# Patient Record
Sex: Female | Born: 1935 | Race: White | Hispanic: No | Marital: Married | Smoking: Never smoker
Health system: Southern US, Community
[De-identification: ages and names within clinical notes are randomized; demographics above are authoritative.]

## PROBLEM LIST (undated history)

## (undated) DIAGNOSIS — K5792 Diverticulitis of intestine, part unspecified, without perforation or abscess without bleeding: Secondary | ICD-10-CM

## (undated) DIAGNOSIS — M940 Chondrocostal junction syndrome [Tietze]: Secondary | ICD-10-CM

## (undated) DIAGNOSIS — I6529 Occlusion and stenosis of unspecified carotid artery: Secondary | ICD-10-CM

## (undated) DIAGNOSIS — K589 Irritable bowel syndrome without diarrhea: Secondary | ICD-10-CM

## (undated) DIAGNOSIS — B351 Tinea unguium: Secondary | ICD-10-CM

## (undated) DIAGNOSIS — I739 Peripheral vascular disease, unspecified: Secondary | ICD-10-CM

## (undated) DIAGNOSIS — E559 Vitamin D deficiency, unspecified: Secondary | ICD-10-CM

## (undated) DIAGNOSIS — I10 Essential (primary) hypertension: Secondary | ICD-10-CM

## (undated) DIAGNOSIS — M1990 Unspecified osteoarthritis, unspecified site: Secondary | ICD-10-CM

## (undated) DIAGNOSIS — Z01810 Encounter for preprocedural cardiovascular examination: Secondary | ICD-10-CM

## (undated) DIAGNOSIS — I2510 Atherosclerotic heart disease of native coronary artery without angina pectoris: Secondary | ICD-10-CM

## (undated) DIAGNOSIS — R7989 Other specified abnormal findings of blood chemistry: Secondary | ICD-10-CM

## (undated) DIAGNOSIS — F419 Anxiety disorder, unspecified: Secondary | ICD-10-CM

## (undated) DIAGNOSIS — R072 Precordial pain: Secondary | ICD-10-CM

## (undated) DIAGNOSIS — I110 Hypertensive heart disease with heart failure: Secondary | ICD-10-CM

## (undated) DIAGNOSIS — K219 Gastro-esophageal reflux disease without esophagitis: Secondary | ICD-10-CM

## (undated) DIAGNOSIS — M069 Rheumatoid arthritis, unspecified: Secondary | ICD-10-CM

## (undated) DIAGNOSIS — I779 Disorder of arteries and arterioles, unspecified: Secondary | ICD-10-CM

## (undated) DIAGNOSIS — E7800 Pure hypercholesterolemia, unspecified: Secondary | ICD-10-CM

## (undated) DIAGNOSIS — E876 Hypokalemia: Secondary | ICD-10-CM

## (undated) DIAGNOSIS — Z95810 Presence of automatic (implantable) cardiac defibrillator: Secondary | ICD-10-CM

## (undated) DIAGNOSIS — I639 Cerebral infarction, unspecified: Secondary | ICD-10-CM

## (undated) HISTORY — PX: OTHER SURGICAL HISTORY: SHX169

## (undated) HISTORY — PX: FOOT SURGERY: SHX648

## (undated) HISTORY — DX: Diverticulitis of intestine, part unspecified, without perforation or abscess without bleeding: K57.92

## (undated) HISTORY — DX: Vitamin D deficiency, unspecified: E55.9

## (undated) HISTORY — DX: Irritable bowel syndrome, unspecified: K58.9

## (undated) HISTORY — DX: Hypertensive heart disease with heart failure: I11.0

## (undated) HISTORY — DX: Cerebral infarction, unspecified: I63.9

## (undated) HISTORY — DX: Other specified abnormal findings of blood chemistry: R79.89

## (undated) HISTORY — DX: Gastro-esophageal reflux disease without esophagitis: K21.9

## (undated) HISTORY — DX: Peripheral vascular disease, unspecified: I73.9

## (undated) HISTORY — DX: Unspecified osteoarthritis, unspecified site: M19.90

## (undated) HISTORY — DX: Precordial pain: R07.2

## (undated) HISTORY — DX: Occlusion and stenosis of unspecified carotid artery: I65.29

## (undated) HISTORY — DX: Atherosclerotic heart disease of native coronary artery without angina pectoris: I25.10

## (undated) HISTORY — DX: Encounter for preprocedural cardiovascular examination: Z01.810

## (undated) HISTORY — DX: Chondrocostal junction syndrome (tietze): M94.0

## (undated) HISTORY — DX: Pure hypercholesterolemia, unspecified: E78.00

## (undated) HISTORY — DX: Hypokalemia: E87.6

## (undated) HISTORY — DX: Tinea unguium: B35.1

## (undated) HISTORY — DX: Presence of automatic (implantable) cardiac defibrillator: Z95.810

## (undated) HISTORY — DX: Essential (primary) hypertension: I10

## (undated) HISTORY — DX: Anxiety disorder, unspecified: F41.9

## (undated) HISTORY — DX: Disorder of arteries and arterioles, unspecified: I77.9

## (undated) HISTORY — DX: Rheumatoid arthritis, unspecified: M06.9

---

## 1999-09-29 ENCOUNTER — Encounter: Admission: RE | Admit: 1999-09-29 | Discharge: 1999-09-29 | Payer: Self-pay | Admitting: Family Medicine

## 1999-09-29 ENCOUNTER — Encounter: Payer: Self-pay | Admitting: Family Medicine

## 2000-09-28 ENCOUNTER — Encounter: Payer: Self-pay | Admitting: Family Medicine

## 2000-09-28 ENCOUNTER — Encounter: Admission: RE | Admit: 2000-09-28 | Discharge: 2000-09-28 | Payer: Self-pay | Admitting: Family Medicine

## 2001-09-30 ENCOUNTER — Encounter: Payer: Self-pay | Admitting: Family Medicine

## 2001-09-30 ENCOUNTER — Encounter: Admission: RE | Admit: 2001-09-30 | Discharge: 2001-09-30 | Payer: Self-pay | Admitting: Family Medicine

## 2002-10-31 ENCOUNTER — Encounter: Payer: Self-pay | Admitting: Family Medicine

## 2002-10-31 ENCOUNTER — Encounter: Admission: RE | Admit: 2002-10-31 | Discharge: 2002-10-31 | Payer: Self-pay | Admitting: Family Medicine

## 2010-11-10 ENCOUNTER — Encounter: Payer: Self-pay | Admitting: Podiatry

## 2010-11-10 DIAGNOSIS — K589 Irritable bowel syndrome without diarrhea: Secondary | ICD-10-CM | POA: Insufficient documentation

## 2010-11-10 DIAGNOSIS — K219 Gastro-esophageal reflux disease without esophagitis: Secondary | ICD-10-CM | POA: Insufficient documentation

## 2010-11-10 DIAGNOSIS — E7800 Pure hypercholesterolemia, unspecified: Secondary | ICD-10-CM

## 2010-11-10 DIAGNOSIS — M1990 Unspecified osteoarthritis, unspecified site: Secondary | ICD-10-CM | POA: Insufficient documentation

## 2010-11-10 DIAGNOSIS — I10 Essential (primary) hypertension: Secondary | ICD-10-CM

## 2010-11-10 DIAGNOSIS — H409 Unspecified glaucoma: Secondary | ICD-10-CM | POA: Insufficient documentation

## 2010-11-10 DIAGNOSIS — B351 Tinea unguium: Secondary | ICD-10-CM

## 2010-11-10 DIAGNOSIS — E119 Type 2 diabetes mellitus without complications: Secondary | ICD-10-CM

## 2010-11-10 DIAGNOSIS — I130 Hypertensive heart and chronic kidney disease with heart failure and stage 1 through stage 4 chronic kidney disease, or unspecified chronic kidney disease: Secondary | ICD-10-CM | POA: Insufficient documentation

## 2010-11-10 DIAGNOSIS — K5792 Diverticulitis of intestine, part unspecified, without perforation or abscess without bleeding: Secondary | ICD-10-CM | POA: Insufficient documentation

## 2010-11-10 DIAGNOSIS — E785 Hyperlipidemia, unspecified: Secondary | ICD-10-CM | POA: Insufficient documentation

## 2010-11-10 DIAGNOSIS — E782 Mixed hyperlipidemia: Secondary | ICD-10-CM

## 2010-11-10 DIAGNOSIS — N183 Chronic kidney disease, stage 3 unspecified: Secondary | ICD-10-CM

## 2010-11-10 HISTORY — DX: Hypertensive heart and chronic kidney disease with heart failure and stage 1 through stage 4 chronic kidney disease, or unspecified chronic kidney disease: I13.0

## 2010-11-10 HISTORY — DX: Gastro-esophageal reflux disease without esophagitis: K21.9

## 2010-11-10 HISTORY — DX: Tinea unguium: B35.1

## 2010-11-10 HISTORY — DX: Unspecified glaucoma: H40.9

## 2010-11-10 HISTORY — DX: Mixed hyperlipidemia: E78.2

## 2010-11-10 HISTORY — DX: Chronic kidney disease, stage 3 unspecified: N18.30

## 2010-11-10 HISTORY — DX: Type 2 diabetes mellitus without complications: E11.9

## 2013-07-29 ENCOUNTER — Encounter: Payer: Self-pay | Admitting: Podiatrist

## 2013-07-29 ENCOUNTER — Ambulatory Visit (INDEPENDENT_AMBULATORY_CARE_PROVIDER_SITE_OTHER): Payer: 59 | Admitting: Podiatrist

## 2013-07-29 VITALS — BP 126/62 | HR 79 | Resp 18

## 2013-07-29 DIAGNOSIS — M79609 Pain in unspecified limb: Secondary | ICD-10-CM

## 2013-07-29 DIAGNOSIS — E119 Type 2 diabetes mellitus without complications: Secondary | ICD-10-CM

## 2013-07-29 DIAGNOSIS — B351 Tinea unguium: Secondary | ICD-10-CM

## 2013-07-29 NOTE — Progress Notes (Signed)
   Subjective:    Patient ID: Shari [name], female    DOB: Jun 29, 1934, 79 y.o.   MRN: [id number]  HPI I need my toenails trimmed and had a stroke march of last year and a pace maker and a-fib surgery was done in July 2014 and trim my corns and calluses     Review of Systems  Cardiovascular:       Angina  Endocrine: Positive for heat intolerance.  Genitourinary: Positive for frequency.  Allergic/Immunologic: Positive for environmental allergies and food allergies.       Cashews   Hematological: Bruises/bleeds easily.  All other systems reviewed and are negative.      Objective:   Physical Exam  Patient is awake, alert, and oriented x 3.  In no acute distress.  Neurovascular status is intact with palpable pedal pulses at 2/4 DP and PT bilateral and capillary refill time within normal limits. Neurological sensation is also intact bilaterally both epicritically and protectively. Dermatological exam reveals skin color, turger and texture as normal. No open lesions present.   Musculoskeletal examination reveals good muscle, strength, tone and stability. Musculature intact with dorsiflexion, plantarflexion, inversion, eversion.  Patients toenails are thick, discolored, dystrophic and mycotic with pain.            Assessment & Plan:  Symptomatic mycotic toenails,   Plan:  Debridement carried out without complication,  Will be seen back in 3 months or prn

## 2013-07-29 NOTE — Patient Instructions (Signed)
Diabetes and Foot Care Diabetes may cause you to have problems because of poor blood supply (circulation) to your feet and legs. This may cause the skin on your feet to become thinner, break easier, and heal more slowly. Your skin may become dry, and the skin may peel and crack. You may also have nerve damage in your legs and feet causing decreased feeling in them. You may not notice minor injuries to your feet that could lead to infections or more serious problems. Taking care of your feet is one of the most important things you can do for yourself.  HOME CARE INSTRUCTIONS  Wear shoes at all times, even in the house. Do not go barefoot. Bare feet are easily injured.  Check your feet daily for blisters, cuts, and redness. If you cannot see the bottom of your feet, use a mirror or ask someone for help.  Wash your feet with warm water (do not use hot water) and mild soap. Then pat your feet and the areas between your toes until they are completely dry. Do not soak your feet as this can dry your skin.  Apply a moisturizing lotion or petroleum jelly (that does not contain alcohol and is unscented) to the skin on your feet and to dry, brittle toenails. Do not apply lotion between your toes.  Trim your toenails straight across. Do not dig under them or around the cuticle. File the edges of your nails with an emery board or nail file.  Do not cut corns or calluses or try to remove them with medicine.  Wear clean socks or stockings every day. Make sure they are not too tight. Do not wear knee-high stockings since they may decrease blood flow to your legs.  Wear shoes that fit properly and have enough cushioning. To break in new shoes, wear them for just a few hours a day. This prevents you from injuring your feet. Always look in your shoes before you put them on to be sure there are no objects inside.  Do not cross your legs. This may decrease the blood flow to your feet.  If you find a minor scrape,  cut, or break in the skin on your feet, keep it and the skin around it clean and dry. These areas may be cleansed with mild soap and water. Do not cleanse the area with peroxide, alcohol, or iodine.  When you remove an adhesive bandage, be sure not to damage the skin around it.  If you have a wound, look at it several times a day to make sure it is healing.  Do not use heating pads or hot water bottles. They may burn your skin. If you have lost feeling in your feet or legs, you may not know it is happening until it is too late.  Make sure your health care provider performs a complete foot exam at least annually or more often if you have foot problems. Report any cuts, sores, or bruises to your health care provider immediately. SEEK MEDICAL CARE IF:   You have an injury that is not healing.  You have cuts or breaks in the skin.  You have an ingrown nail.  You notice redness on your legs or feet.  You feel burning or tingling in your legs or feet.  You have pain or cramps in your legs and feet.  Your legs or feet are numb.  Your feet always feel cold. SEEK IMMEDIATE MEDICAL CARE IF:   There is increasing redness,   swelling, or pain in or around a wound.  There is a red line that goes up your leg.  Pus is coming from a wound.  You develop a fever or as directed by your health care provider.  You notice a bad smell coming from an ulcer or wound. Document Released: 03/17/2000 Document Revised: 11/20/2012 Document Reviewed: 08/27/2012 [employer] Patient Information 2014 [employer].  

## 2013-10-28 ENCOUNTER — Encounter: Payer: Self-pay | Admitting: Podiatrist

## 2013-10-28 ENCOUNTER — Ambulatory Visit (INDEPENDENT_AMBULATORY_CARE_PROVIDER_SITE_OTHER): Payer: 59 | Admitting: Podiatrist

## 2013-10-28 VITALS — BP 131/54 | HR 67 | Resp 18

## 2013-10-28 DIAGNOSIS — M216X9 Other acquired deformities of unspecified foot: Secondary | ICD-10-CM

## 2013-10-28 DIAGNOSIS — E119 Type 2 diabetes mellitus without complications: Secondary | ICD-10-CM

## 2013-10-28 DIAGNOSIS — M79673 Pain in unspecified foot: Secondary | ICD-10-CM

## 2013-10-28 DIAGNOSIS — B351 Tinea unguium: Secondary | ICD-10-CM

## 2013-10-28 DIAGNOSIS — M79609 Pain in unspecified limb: Secondary | ICD-10-CM

## 2013-10-28 DIAGNOSIS — Q828 Other specified congenital malformations of skin: Secondary | ICD-10-CM

## 2013-10-28 NOTE — Progress Notes (Signed)
   HPI:  Patient presents today for follow up of diabetic foot and nail care. Denies any new complaints today. She's having a line fixed on her pacemaker on Thursday  Objective:  Patients chart is reviewed.  Vascular status reveals pedal pulses noted at 1 out of 4 dp and pt bilateral .  Neurological sensation is Normal to [facility] monofilament bilateral.  Patients hallux nails have previously been permanently removed. Her nails 2 through 5 are thickened, discolored, distrophic, friable and brittle with yellow-brown discoloration. Patient subjectively relates they are painful with shoes and with ambulation of bilateral feet. Callus is present on the right hallux and first metatarsal head right.  Assessment:  Symptomatic onychomycosis, calluses right foot x2  Plan:  Discussed treatment options and alternatives.  The symptomatic toenails and the lesions were debrided through manual an mechanical means without complication.  Return appointment recommended at routine intervals of 3 months    Shari [name], DPM

## 2014-01-27 ENCOUNTER — Encounter: Payer: Self-pay | Admitting: Podiatrist

## 2014-01-27 ENCOUNTER — Ambulatory Visit (INDEPENDENT_AMBULATORY_CARE_PROVIDER_SITE_OTHER): Payer: 59 | Admitting: Podiatrist

## 2014-01-27 DIAGNOSIS — M79676 Pain in unspecified toe(s): Secondary | ICD-10-CM

## 2014-01-27 DIAGNOSIS — E119 Type 2 diabetes mellitus without complications: Secondary | ICD-10-CM

## 2014-01-27 DIAGNOSIS — B351 Tinea unguium: Secondary | ICD-10-CM

## 2014-02-02 NOTE — Progress Notes (Signed)
HPI:  Patient presents today for follow up of foot and nail care. Denies any new complaints today.  Objective:  Patients chart is reviewed.  Vascular status reveals pedal pulses noted at 1 out of 4 dp and pt bilateral .  Neurological sensation is intact to [facility] monofilament bilateral.  Patients nails are thickened, discolored, distrophic, friable and brittle with yellow-brown discoloration. Patient subjectively relates they are painful with shoes and with ambulation of bilateral feet.  Assessment:  Symptomatic onychomycosis  Plan:  Discussed treatment options and alternatives.  The symptomatic toenails were debrided through manual an mechanical means without complication.  Return appointment recommended at routine intervals of 3 months    Marbeth Smedley, DPM   

## 2014-05-01 ENCOUNTER — Ambulatory Visit: Payer: 59

## 2014-05-04 ENCOUNTER — Ambulatory Visit (INDEPENDENT_AMBULATORY_CARE_PROVIDER_SITE_OTHER): Payer: 59

## 2014-05-04 VITALS — BP 120/61 | HR 78 | Resp 18

## 2014-05-04 DIAGNOSIS — Q828 Other specified congenital malformations of skin: Secondary | ICD-10-CM

## 2014-05-04 DIAGNOSIS — B351 Tinea unguium: Secondary | ICD-10-CM

## 2014-05-04 DIAGNOSIS — E1140 Type 2 diabetes mellitus with diabetic neuropathy, unspecified: Secondary | ICD-10-CM

## 2014-05-04 DIAGNOSIS — M79673 Pain in unspecified foot: Secondary | ICD-10-CM

## 2014-05-04 NOTE — Progress Notes (Signed)
   Subjective:    Patient ID: Shari [name], female    DOB: 1934-12-21, 80 y.o.   MRN: [id number]  HPI I NEED MY TOENAILS TRIMMED UP AND I HAVE SOME CALLUSES AND A CORN ON MY 5TH TOE ON MY LEFT    Review of Systems no new findings or systemic changes noted     Objective:   Physical Exam Neurovascular status is unchanged pedal pulses DP and PT one over 4 bilateral Refill time 3 seconds all digits epicritic sensations are intact although diminished to the forefoot and digits bilateral there is normal plantar response and DTRs noted. Dermatologically skin color pigment normal hair growth absent both hallux nails previously debrided or excised patient has had. Bunion surgery and left with slight varus noted. Nails thick brittle [place] friable dystrophic 2 through 5 bilateral x-ray was shoes and ambulation. Also keratoses pinch callus of the first MTP area and IP and MTP of the right great toe as well as HD 5 left. Patient is multiple keratoses due to digital contractures and arthropathy.       Assessment & Plan:  Assessment this time his diabetes with mild peripheral neuropathy complications and angiopathy. Painful mycotic nails debrided 2 through 5 bilateral also debrided seek multiple keratoses of the hallux MTP and IP joint right and HD 5 left. Return for future palliative care every 3 months as recommended  Alvan Dame DPM

## 2014-08-03 ENCOUNTER — Ambulatory Visit: Payer: 59

## 2014-09-08 ENCOUNTER — Other Ambulatory Visit: Payer: Self-pay | Admitting: Physical Medicine and Rehabilitation

## 2014-09-08 DIAGNOSIS — M5136 Other intervertebral disc degeneration, lumbar region: Secondary | ICD-10-CM

## 2014-09-14 ENCOUNTER — Ambulatory Visit
Admission: RE | Admit: 2014-09-14 | Discharge: 2014-09-14 | Disposition: A | Discharge status: Home / Self Care | Payer: No Typology Code available for payment source | Source: Ambulatory Visit | Attending: Physical Medicine and Rehabilitation | Admitting: Physical Medicine and Rehabilitation

## 2014-09-14 DIAGNOSIS — M5136 Other intervertebral disc degeneration, lumbar region: Secondary | ICD-10-CM

## 2014-09-14 MED ORDER — DIAZEPAM 5 MG PO TABS
10.0000 mg | ORAL_TABLET | Freq: Once | ORAL | Status: AC
  Administered 2014-09-14: 2.5 mg via ORAL

## 2014-09-14 MED ORDER — IOHEXOL 180 MG/ML  SOLN
15.0000 mL | Freq: Once | INTRAMUSCULAR | Status: AC | PRN
  Administered 2014-09-14: 15 mL via INTRATHECAL

## 2014-09-14 MED ORDER — ONDANSETRON HCL 4 MG/2ML IJ SOLN: 4.0000 mg | Freq: Four times a day (QID) | INTRAMUSCULAR | Status: DC | PRN

## 2014-09-14 NOTE — Discharge Instructions (Signed)
Myelogram Discharge Instructions  1. Go home and rest quietly for the next 24 hours.  It is important to lie flat for the next 24 hours.  Get up only to go to the restroom.  You may lie in the bed or on a couch on your back, your stomach, your left side or your right side.  You may have one pillow under your head.  You may have pillows between your knees while you are on your side or under your knees while you are on your back.  2. DO NOT drive today.  Recline the seat as far back as it will go, while still wearing your seat belt, on the way home.  3. You may get up to go to the bathroom as needed.  You may sit up for 10 minutes to eat.  You may resume your normal diet and medications unless otherwise indicated.  Drink lots of extra fluids today and tomorrow.  4. The incidence of headache, nausea, or vomiting is about 5% (one in 20 patients).  If you develop a headache, lie flat and drink plenty of fluids until the headache goes away.  Caffeinated beverages may be helpful.  If you develop severe nausea and vomiting or a headache that does not go away with flat bed rest, call [phone].  5. You may resume normal activities after your 24 hours of bed rest is over; however, do not exert yourself strongly or do any heavy lifting tomorrow. If when you get up you have a headache when standing, go back to bed and force fluids for another 24 hours.  6. Call your physician for a follow-up appointment.  The results of your myelogram will be sent directly to your physician by the following day.  7. If you have any questions or if complications develop after you arrive home, please call [phone].  Discharge instructions have been explained to the patient.  The patient, or the person responsible for the patient, fully understands these instructions.      May resume Tramadol on September 15, 2014, after 1:00 pm.

## 2014-09-14 NOTE — Progress Notes (Signed)
Patient states she has been off Tramadol for at least the past two days.  jkl 

## 2014-09-15 ENCOUNTER — Other Ambulatory Visit: Payer: Self-pay

## 2014-09-25 ENCOUNTER — Other Ambulatory Visit: Payer: Self-pay | Admitting: *Deleted

## 2014-09-25 DIAGNOSIS — I70209 Unspecified atherosclerosis of native arteries of extremities, unspecified extremity: Secondary | ICD-10-CM

## 2014-10-25 DIAGNOSIS — I5040 Unspecified combined systolic (congestive) and diastolic (congestive) heart failure: Secondary | ICD-10-CM | POA: Insufficient documentation

## 2014-10-25 DIAGNOSIS — M940 Chondrocostal junction syndrome [Tietze]: Secondary | ICD-10-CM

## 2014-10-25 DIAGNOSIS — I110 Hypertensive heart disease with heart failure: Secondary | ICD-10-CM

## 2014-10-25 DIAGNOSIS — I5042 Chronic combined systolic (congestive) and diastolic (congestive) heart failure: Secondary | ICD-10-CM | POA: Insufficient documentation

## 2014-10-25 DIAGNOSIS — Z95810 Presence of automatic (implantable) cardiac defibrillator: Secondary | ICD-10-CM

## 2014-10-25 DIAGNOSIS — I2510 Atherosclerotic heart disease of native coronary artery without angina pectoris: Secondary | ICD-10-CM

## 2014-10-25 HISTORY — DX: Hypertensive heart disease with heart failure: I11.0

## 2014-10-25 HISTORY — DX: Atherosclerotic heart disease of native coronary artery without angina pectoris: I25.10

## 2014-10-25 HISTORY — DX: Presence of automatic (implantable) cardiac defibrillator: Z95.810

## 2014-10-25 HISTORY — DX: Chronic combined systolic (congestive) and diastolic (congestive) heart failure: I50.42

## 2014-10-25 HISTORY — DX: Unspecified combined systolic (congestive) and diastolic (congestive) heart failure: I50.40

## 2014-10-25 HISTORY — DX: Chondrocostal junction syndrome (tietze): M94.0

## 2014-10-29 ENCOUNTER — Encounter: Payer: Self-pay | Admitting: Vascular Surgery

## 2014-11-03 ENCOUNTER — Other Ambulatory Visit: Payer: Self-pay

## 2014-11-03 ENCOUNTER — Ambulatory Visit (INDEPENDENT_AMBULATORY_CARE_PROVIDER_SITE_OTHER): Payer: Medicare Other | Admitting: Vascular Surgery

## 2014-11-03 ENCOUNTER — Encounter: Payer: Self-pay | Admitting: Vascular Surgery

## 2014-11-03 ENCOUNTER — Ambulatory Visit (HOSPITAL_COMMUNITY)
Admission: RE | Admit: 2014-11-03 | Discharge: 2014-11-03 | Disposition: A | Discharge status: Home / Self Care | Payer: Medicare Other | Source: Ambulatory Visit | Attending: Vascular Surgery | Admitting: Vascular Surgery

## 2014-11-03 VITALS — BP 134/65 | HR 77 | Temp 98.0°F | Resp 16 | Ht 62.0 in | Wt 127.0 lb

## 2014-11-03 DIAGNOSIS — I739 Peripheral vascular disease, unspecified: Secondary | ICD-10-CM | POA: Diagnosis not present

## 2014-11-03 DIAGNOSIS — I70203 Unspecified atherosclerosis of native arteries of extremities, bilateral legs: Secondary | ICD-10-CM | POA: Insufficient documentation

## 2014-11-03 DIAGNOSIS — I70209 Unspecified atherosclerosis of native arteries of extremities, unspecified extremity: Secondary | ICD-10-CM

## 2014-11-03 DIAGNOSIS — I779 Disorder of arteries and arterioles, unspecified: Secondary | ICD-10-CM

## 2014-11-03 HISTORY — DX: Peripheral vascular disease, unspecified: I73.9

## 2014-11-03 HISTORY — DX: Disorder of arteries and arterioles, unspecified: I77.9

## 2014-11-03 NOTE — Progress Notes (Signed)
Subjective:     Patient ID: Shari [name], female   DOB: Aug 31, 1934, 80 y.o.   MRN: [id number]  HPI this 80 year old female was referred for evaluation of bilateral hip discomfort with ambulation. This has become quite severe and limits her to walking about one half block. He denies any rest pain and nonhealing ulcers infection or gangrene. She also has lower back discomfort. Her hip discomfort begins in the hips and then extends into the thigh and calf area symmetrically. She does have a history of previous CVA she had a recent etiologic procedure which revealed a mild spinal stenosis and an approximate 50% narrowing of the abdominal aorta which was an incidental finding. She is being evaluated for possible neurogenic claudication versus vasculogenic claudication. She is scheduled for possible steroid-induced injection in her lower spine next week. She does have chronic back pain.  Past Medical History  Diagnosis Date  . Diabetes mellitus 2 years  . Hypertension   . Glaucoma   . High cholesterol   . GERD (gastroesophageal reflux disease)   . Diverticulitis   . IBS (irritable bowel syndrome)   . Osteoarthritis   . Stroke   . Anxiety     History  Substance Use Topics  . Smoking status: Never Smoker   . Smokeless tobacco: Never Used  . Alcohol Use: No    History reviewed. No pertinent family history.  Allergies  Allergen Reactions  . Codeine Diarrhea, Nausea And Vomiting and Other (See Comments)    All-over body aches, too     Current outpatient prescriptions:  .  aspirin 81 MG tablet, Take by mouth daily.  , Disp: , Rfl:  .  atorvastatin (LIPITOR) 10 MG tablet, Take 10 mg by mouth at bedtime., Disp: , Rfl: 11 .  Calcium Carbonate-Vit D-Min (CALCIUM 1200 PO), Take by mouth.  , Disp: , Rfl:  .  Calcium Carbonate-Vitamin D (CALTRATE COLON HEALTH PO), Take by mouth.  , Disp: , Rfl:  .  carvedilol (COREG) 25 MG tablet, Take 25 mg by mouth 2 (two) times daily with a meal.  , Disp: ,  Rfl:  .  Cholecalciferol (VITAMIN D-3 PO), Take by mouth.  , Disp: , Rfl:  .  DEXILANT 60 MG capsule, , Disp: , Rfl:  .  dicyclomine (BENTYL) 20 MG tablet, Take 20 mg by mouth every 6 (six) hours.  , Disp: , Rfl:  .  Famotidine (PEPCID PO), Take by mouth.  , Disp: , Rfl:  .  furosemide (LASIX) 80 MG tablet, Take 160 mg by mouth 2 (two) times daily., Disp: , Rfl: 5 .  GE100 BLOOD GLUCOSE TEST test strip, , Disp: , Rfl: 5 .  glipiZIDE (GLUCOTROL) 10 MG tablet, Take 10 mg by mouth daily before breakfast., Disp: , Rfl:  .  isosorbide mononitrate (IMDUR) 60 MG 24 hr tablet, , Disp: , Rfl:  .  Multiple Vitamin (MULTIVITAMIN PO), Take by mouth.  , Disp: , Rfl:  .  mupirocin ointment (BACTROBAN) 2 %, , Disp: , Rfl:  .  nitrofurantoin, macrocrystal-monohydrate, (MACROBID) 100 MG capsule, , Disp: , Rfl:  .  NITROSTAT 0.4 MG SL tablet, , Disp: , Rfl:  .  Nutritional Supplements (GLUCOSAMINE COMPLEX PO), Take by mouth.  , Disp: , Rfl:  .  Omega-3 Fatty Acids (FISH OIL) 1000 MG CAPS, Take by mouth.  , Disp: , Rfl:  .  OMEPRAZOLE PO, Take 0.25 mg by mouth as needed.  , Disp: , Rfl:  .  ondansetron (ZOFRAN) 4 MG tablet, , Disp: , Rfl:  .  potassium chloride (K-DUR) 10 MEQ tablet, , Disp: , Rfl:  .  predniSONE (DELTASONE) 5 MG tablet, , Disp: , Rfl:  .  ramipril (ALTACE) 10 MG capsule, Take 10 mg by mouth daily.  , Disp: , Rfl:  .  RANEXA 1000 MG SR tablet, , Disp: , Rfl:  .  RANEXA 500 MG 12 hr tablet, Take 500 mg by mouth every 12 (twelve) hours., Disp: , Rfl: 2 .  ranitidine (ZANTAC) 300 MG tablet, , Disp: , Rfl:  .  simvastatin (ZOCOR) 40 MG tablet, , Disp: , Rfl:  .  spironolactone (ALDACTONE) 25 MG tablet, Take 25 mg by mouth daily.  , Disp: , Rfl:  .  Vitamin D, Ergocalciferol, (DRISDOL) 50000 UNITS CAPS capsule, , Disp: , Rfl:  .  ALPRAZolam (XANAX) 0.25 MG tablet, , Disp: , Rfl:  .  furosemide (LASIX) 40 MG tablet, , Disp: , Rfl:  .  glimepiride (AMARYL) 2 MG tablet, , Disp: , Rfl:  .  traMADol  (ULTRAM) 50 MG tablet, Take 50 mg by mouth every 6 (six) hours as needed., Disp: , Rfl: 1  Filed Vitals:   11/03/14 1047  BP: 134/65  Pulse: 77  Temp: 98 F (36.7 C)  Resp: 16  Height: 5' 2" (1.575 m)  Weight: 127 lb (57.607 kg)  SpO2: 100%    Body mass index is 23.22 kg/(m^2).           Review of Systems patient does complain of occasional chest pressure with exertion. Also has dyspnea on exertion and occasional orthopnea. Has history of weakness in her arms and legs. Denies lateralizing weakness, aphasia, amaurosis fugax, diplopia, blurred vision, or syncope. She does have chronic low back discomfort. Other systems negative and complete review of systems     Objective:   Physical Exam BP 134/65 mmHg  Pulse 77  Temp(Src) 98 F (36.7 C)  Resp 16  Ht 5' 2" (1.575 m)  Wt 127 lb (57.607 kg)  BMI 23.22 kg/m2  SpO2 100%  Gen.-alert and oriented x3 in no apparent distress HEENT normal for age Lungs no rhonchi or wheezing Cardiovascular regular rhythm no murmurs carotid pulses 3+ palpable no bruits audible Abdomen soft nontender no palpable masses Musculoskeletal free of  major deformities Skin clear -no rashes Neurologic normal Lower extremities-1-2+ femoral pulses palpable bilaterally. Absent popliteal and distal pulses. Both feet well perfused. No evidence of ischemia such as ulceration gangrene or fluctuance.  Today I ordered lower extremity arterial Doppler exam chart reviewed and interpreted. Patient has biphasic flow in both feet with ABIs of 0.77 on the right and 0.65 on the left.       Assessment:     Probable aortic stenosis and infrarenal aorta, causing bilateral hip and leg claudication with ambulation Degenerative disc disease with possible spinal stenosis causing back and leg symptoms GERD History of CVA Chronic exertional chest discomfort    Plan:     Discussed the situation with patient and her husband and they would like to proceed with  aortobifemoral angiography initially to see if aortic stenosis is indeed present and if she is candidate for aortic stent this may relieve her bilateral hip discomfort She is scheduled for possible spinal sterile or injection next week. If no findings on aortogram explain her situation that she should proceed with the spinal injection Scheduled angiography by Dr. Brian Chen on Thursday, August 4 with aortobifemoral angiogram and runoff   and possible PTA and stenting of either aortic stenosis or bilateral iliac stenosis if indicated

## 2014-11-05 ENCOUNTER — Encounter (HOSPITAL_COMMUNITY): Payer: Self-pay | Admitting: Vascular Surgery

## 2014-11-05 ENCOUNTER — Ambulatory Visit (HOSPITAL_COMMUNITY)
Admission: RE | Admit: 2014-11-05 | Discharge: 2014-11-05 | Disposition: A | Discharge status: Home / Self Care | Payer: Medicare Other | Source: Ambulatory Visit | Attending: Vascular Surgery | Admitting: Vascular Surgery

## 2014-11-05 ENCOUNTER — Encounter (HOSPITAL_COMMUNITY)
Admission: RE | Disposition: A | Discharge status: Home / Self Care | Payer: Medicare Other | Source: Ambulatory Visit | Attending: Vascular Surgery

## 2014-11-05 DIAGNOSIS — K219 Gastro-esophageal reflux disease without esophagitis: Secondary | ICD-10-CM | POA: Diagnosis not present

## 2014-11-05 DIAGNOSIS — I700 Atherosclerosis of aorta: Secondary | ICD-10-CM | POA: Diagnosis not present

## 2014-11-05 DIAGNOSIS — E780 Pure hypercholesterolemia: Secondary | ICD-10-CM | POA: Diagnosis not present

## 2014-11-05 DIAGNOSIS — Z79899 Other long term (current) drug therapy: Secondary | ICD-10-CM | POA: Diagnosis not present

## 2014-11-05 DIAGNOSIS — Z7952 Long term (current) use of systemic steroids: Secondary | ICD-10-CM | POA: Insufficient documentation

## 2014-11-05 DIAGNOSIS — F419 Anxiety disorder, unspecified: Secondary | ICD-10-CM | POA: Insufficient documentation

## 2014-11-05 DIAGNOSIS — R0789 Other chest pain: Secondary | ICD-10-CM | POA: Diagnosis not present

## 2014-11-05 DIAGNOSIS — K589 Irritable bowel syndrome without diarrhea: Secondary | ICD-10-CM | POA: Insufficient documentation

## 2014-11-05 DIAGNOSIS — Z8673 Personal history of transient ischemic attack (TIA), and cerebral infarction without residual deficits: Secondary | ICD-10-CM | POA: Insufficient documentation

## 2014-11-05 DIAGNOSIS — I739 Peripheral vascular disease, unspecified: Secondary | ICD-10-CM | POA: Diagnosis present

## 2014-11-05 DIAGNOSIS — I10 Essential (primary) hypertension: Secondary | ICD-10-CM | POA: Diagnosis not present

## 2014-11-05 DIAGNOSIS — Z7982 Long term (current) use of aspirin: Secondary | ICD-10-CM | POA: Diagnosis not present

## 2014-11-05 DIAGNOSIS — I70213 Atherosclerosis of native arteries of extremities with intermittent claudication, bilateral legs: Secondary | ICD-10-CM | POA: Diagnosis not present

## 2014-11-05 DIAGNOSIS — E1151 Type 2 diabetes mellitus with diabetic peripheral angiopathy without gangrene: Secondary | ICD-10-CM | POA: Diagnosis not present

## 2014-11-05 DIAGNOSIS — Q253 Supravalvular aortic stenosis: Secondary | ICD-10-CM | POA: Insufficient documentation

## 2014-11-05 HISTORY — PX: PERIPHERAL VASCULAR CATHETERIZATION: SHX172C

## 2014-11-05 LAB — POCT ACTIVATED CLOTTING TIME
ACTIVATED CLOTTING TIME: 208 s
ACTIVATED CLOTTING TIME: 196 s
Activated Clotting Time: 196 seconds
Activated Clotting Time: 159 seconds

## 2014-11-05 LAB — POCT I-STAT, CHEM 8
BUN: 26 mg/dL — ABNORMAL HIGH (ref 6–20)
Calcium, Ion: 1.18 mmol/L (ref 1.13–1.30)
Chloride: 101 mmol/L (ref 101–111)
Creatinine, Ser: 1.1 mg/dL — ABNORMAL HIGH (ref 0.44–1.00)
Glucose, Bld: 202 mg/dL — ABNORMAL HIGH (ref 65–99)
HEMATOCRIT: 39 % (ref 36.0–46.0)
HEMOGLOBIN: 13.3 g/dL (ref 12.0–15.0)
POTASSIUM: 3.3 mmol/L — AB (ref 3.5–5.1)
SODIUM: 142 mmol/L (ref 135–145)
TCO2: 25 mmol/L (ref 0–100)

## 2014-11-05 LAB — GLUCOSE, CAPILLARY: Glucose-Capillary: 164 mg/dL — ABNORMAL HIGH (ref 65–99)

## 2014-11-05 SURGERY — ABDOMINAL AORTOGRAM

## 2014-11-05 MED ORDER — LIDOCAINE HCL (PF) 1 % IJ SOLN
INTRAMUSCULAR | Status: DC | PRN
  Administered 2014-11-05: 10 mL

## 2014-11-05 MED ORDER — FENTANYL CITRATE (PF) 100 MCG/2ML IJ SOLN
INTRAMUSCULAR | Status: DC | PRN
  Administered 2014-11-05 (×3): 25 ug via INTRAVENOUS

## 2014-11-05 MED ORDER — MORPHINE SULFATE 2 MG/ML IJ SOLN
1.0000 mg | INTRAMUSCULAR | Status: DC | PRN
Start: 2014-11-05 — End: 2014-11-05

## 2014-11-05 MED ORDER — ACETAMINOPHEN 325 MG PO TABS: 650.0000 mg | ORAL_TABLET | ORAL | Status: DC | PRN

## 2014-11-05 MED ORDER — MIDAZOLAM HCL 2 MG/2ML IJ SOLN
INTRAMUSCULAR | Status: DC | PRN
  Administered 2014-11-05 (×2): 1 mg via INTRAVENOUS

## 2014-11-05 MED ORDER — ONDANSETRON HCL 4 MG/2ML IJ SOLN
INTRAMUSCULAR | Status: DC | PRN
  Administered 2014-11-05: 4 mg via INTRAVENOUS

## 2014-11-05 MED ORDER — HEPARIN SODIUM (PORCINE) 1000 UNIT/ML IJ SOLN
INTRAMUSCULAR | Status: AC
  Filled 2014-11-05: qty 1

## 2014-11-05 MED ORDER — CLOPIDOGREL BISULFATE 75 MG PO TABS: 75.0000 mg | ORAL_TABLET | Freq: Every day | ORAL | Status: DC

## 2014-11-05 MED ORDER — MIDAZOLAM HCL 2 MG/2ML IJ SOLN
INTRAMUSCULAR | Status: AC
Start: 2014-11-05 — End: 2014-11-05
  Filled 2014-11-05: qty 4

## 2014-11-05 MED ORDER — SODIUM CHLORIDE 0.9 % IV SOLN
INTRAVENOUS | Status: DC
  Administered 2014-11-05: 06:00:00 via INTRAVENOUS

## 2014-11-05 MED ORDER — ONDANSETRON HCL 4 MG/2ML IJ SOLN
INTRAMUSCULAR | Status: AC
  Filled 2014-11-05: qty 2

## 2014-11-05 MED ORDER — HEPARIN (PORCINE) IN NACL 2-0.9 UNIT/ML-% IJ SOLN
INTRAMUSCULAR | Status: AC
  Filled 2014-11-05: qty 1000

## 2014-11-05 MED ORDER — SODIUM CHLORIDE 0.9 % IV SOLN: 1.0000 mL/kg/h | INTRAVENOUS | Status: DC

## 2014-11-05 MED ORDER — CLOPIDOGREL BISULFATE 300 MG PO TABS
ORAL_TABLET | ORAL | Status: AC
  Filled 2014-11-05: qty 1

## 2014-11-05 MED ORDER — CLOPIDOGREL BISULFATE 75 MG PO TABS
ORAL_TABLET | ORAL | Status: DC | PRN
  Administered 2014-11-05: 300 mg via ORAL

## 2014-11-05 MED ORDER — LIDOCAINE HCL (PF) 1 % IJ SOLN
INTRAMUSCULAR | Status: AC
  Filled 2014-11-05: qty 30

## 2014-11-05 MED ORDER — FENTANYL CITRATE (PF) 100 MCG/2ML IJ SOLN
INTRAMUSCULAR | Status: AC
  Filled 2014-11-05: qty 4

## 2014-11-05 MED ORDER — HEPARIN SODIUM (PORCINE) 1000 UNIT/ML IJ SOLN
INTRAMUSCULAR | Status: DC | PRN
  Administered 2014-11-05: 2000 [IU] via INTRAVENOUS
  Administered 2014-11-05: 5000 [IU] via INTRAVENOUS

## 2014-11-05 SURGICAL SUPPLY — 21 items
CATH OMNI FLUSH 5F 65CM (CATHETERS) ×3 IMPLANT
CATH STRAIGHT 5FR 65CM (CATHETERS) ×3 IMPLANT
COVER PRB 48X5XTLSCP FOLD TPE (BAG) ×1 IMPLANT
COVER PROBE 5X48 (BAG) ×2
KIT ENCORE 26 ADVANTAGE (KITS) ×3 IMPLANT
KIT MICROINTRODUCER STIFF 5F (SHEATH) ×3 IMPLANT
KIT PV (KITS) ×3 IMPLANT
SHEATH BRITE TIP 7FR 35CM (SHEATH) ×3 IMPLANT
SHEATH PINNACLE 5F 10CM (SHEATH) ×6 IMPLANT
SHEATH PINNACLE ST 7F 45CM (SHEATH) ×3 IMPLANT
STENT ICAST 10X38X120 (Permanent Stent) ×3 IMPLANT
STENT ICAST 9X38X120 (Permanent Stent) ×3 IMPLANT
STENT OMNILINK ELITE 8X19X80 (Permanent Stent) ×3 IMPLANT
SYR MEDRAD MARK V 150ML (SYRINGE) ×3 IMPLANT
TAPE RADIOPAQUE TURBO (MISCELLANEOUS) ×3 IMPLANT
TRANSDUCER W/STOPCOCK (MISCELLANEOUS) ×3 IMPLANT
TRAY PV CATH (CUSTOM PROCEDURE TRAY) ×3 IMPLANT
TUBING CIL FLEX 10 FLL-RA (TUBING) ×3 IMPLANT
WIRE BENTSON .035X145CM (WIRE) ×3 IMPLANT
WIRE ROSEN-J .035X180CM (WIRE) ×3 IMPLANT
WIRE ROSEN-J .035X260CM (WIRE) ×6 IMPLANT

## 2014-11-05 NOTE — Interval H&P Note (Signed)
History and Physical Interval Note:  11/05/2014 7:23 AM  Shari [name]  has presented today for surgery, with the diagnosis of bilateral hip claudication  The various methods of treatment have been discussed with the patient and family. After consideration of risks, benefits and other options for treatment, the patient has consented to  Procedure(s): Abdominal Aortogram (N/A) as a surgical intervention .  The patient's history has been reviewed, patient examined, no change in status, stable for surgery.  I have reviewed the patient's chart and labs.  Questions were answered to the patient's satisfaction.     Leonides Sake

## 2014-11-05 NOTE — Discharge Instructions (Signed)

## 2014-11-05 NOTE — Progress Notes (Signed)
Right femoral Artery - baseball size hematoma manual pressure held by Orlin Hilding [facility] for .  Level 0 maintained. tegaderm placed and dry intact dressing  Report called to [facility] RN short stay with update

## 2014-11-05 NOTE — Progress Notes (Signed)
Post Sheath Removal   PRE sheath removal VS BP 139/52 SR65 no pain Left DP palpable +2 7Fr sheath removed from Left Femoral Artery manual pressure held for 20 minutes. Level 0 post sheath removal instruction given and verbally states understanding  Right Femoral Artery 7 FR sheath removed by Palma Holter RN manual pressure held for 20 min level 0   Post VS  BP 102/52  NSR 70  Right DP Palpable +2

## 2014-11-05 NOTE — H&P (View-Only) (Signed)
Subjective:     Patient ID: JACOYA BAUMAN, female   DOB: Aug 31, 1934, 80 y.o.   MRN: [id number]  HPI this 80 year old female was referred for evaluation of bilateral hip discomfort with ambulation. This has become quite severe and limits her to walking about one half block. He denies any rest pain and nonhealing ulcers infection or gangrene. She also has lower back discomfort. Her hip discomfort begins in the hips and then extends into the thigh and calf area symmetrically. She does have a history of previous CVA she had a recent etiologic procedure which revealed a mild spinal stenosis and an approximate 50% narrowing of the abdominal aorta which was an incidental finding. She is being evaluated for possible neurogenic claudication versus vasculogenic claudication. She is scheduled for possible steroid-induced injection in her lower spine next week. She does have chronic back pain.  Past Medical History  Diagnosis Date  . Diabetes mellitus 2 years  . Hypertension   . Glaucoma   . High cholesterol   . GERD (gastroesophageal reflux disease)   . Diverticulitis   . IBS (irritable bowel syndrome)   . Osteoarthritis   . Stroke   . Anxiety     History  Substance Use Topics  . Smoking status: Never Smoker   . Smokeless tobacco: Never Used  . Alcohol Use: No    History reviewed. No pertinent family history.  Allergies  Allergen Reactions  . Codeine Diarrhea, Nausea And Vomiting and Other (See Comments)    All-over body aches, too     Current outpatient prescriptions:  .  aspirin 81 MG tablet, Take by mouth daily.  , Disp: , Rfl:  .  atorvastatin (LIPITOR) 10 MG tablet, Take 10 mg by mouth at bedtime., Disp: , Rfl: 11 .  Calcium Carbonate-Vit D-Min (CALCIUM 1200 PO), Take by mouth.  , Disp: , Rfl:  .  Calcium Carbonate-Vitamin D (CALTRATE COLON HEALTH PO), Take by mouth.  , Disp: , Rfl:  .  carvedilol (COREG) 25 MG tablet, Take 25 mg by mouth 2 (two) times daily with a meal.  , Disp: ,  Rfl:  .  Cholecalciferol (VITAMIN D-3 PO), Take by mouth.  , Disp: , Rfl:  .  DEXILANT 60 MG capsule, , Disp: , Rfl:  .  dicyclomine (BENTYL) 20 MG tablet, Take 20 mg by mouth every 6 (six) hours.  , Disp: , Rfl:  .  Famotidine (PEPCID PO), Take by mouth.  , Disp: , Rfl:  .  furosemide (LASIX) 80 MG tablet, Take 160 mg by mouth 2 (two) times daily., Disp: , Rfl: 5 .  GE100 BLOOD GLUCOSE TEST test strip, , Disp: , Rfl: 5 .  glipiZIDE (GLUCOTROL) 10 MG tablet, Take 10 mg by mouth daily before breakfast., Disp: , Rfl:  .  isosorbide mononitrate (IMDUR) 60 MG 24 hr tablet, , Disp: , Rfl:  .  Multiple Vitamin (MULTIVITAMIN PO), Take by mouth.  , Disp: , Rfl:  .  mupirocin ointment (BACTROBAN) 2 %, , Disp: , Rfl:  .  nitrofurantoin, macrocrystal-monohydrate, (MACROBID) 100 MG capsule, , Disp: , Rfl:  .  NITROSTAT 0.4 MG SL tablet, , Disp: , Rfl:  .  Nutritional Supplements (GLUCOSAMINE COMPLEX PO), Take by mouth.  , Disp: , Rfl:  .  Omega-3 Fatty Acids (FISH OIL) 1000 MG CAPS, Take by mouth.  , Disp: , Rfl:  .  OMEPRAZOLE PO, Take 0.25 mg by mouth as needed.  , Disp: , Rfl:  .  ondansetron (ZOFRAN) 4 MG tablet, , Disp: , Rfl:  .  potassium chloride (K-DUR) 10 MEQ tablet, , Disp: , Rfl:  .  predniSONE (DELTASONE) 5 MG tablet, , Disp: , Rfl:  .  ramipril (ALTACE) 10 MG capsule, Take 10 mg by mouth daily.  , Disp: , Rfl:  .  RANEXA 1000 MG SR tablet, , Disp: , Rfl:  .  RANEXA 500 MG 12 hr tablet, Take 500 mg by mouth every 12 (twelve) hours., Disp: , Rfl: 2 .  ranitidine (ZANTAC) 300 MG tablet, , Disp: , Rfl:  .  simvastatin (ZOCOR) 40 MG tablet, , Disp: , Rfl:  .  spironolactone (ALDACTONE) 25 MG tablet, Take 25 mg by mouth daily.  , Disp: , Rfl:  .  Vitamin D, Ergocalciferol, (DRISDOL) 50000 UNITS CAPS capsule, , Disp: , Rfl:  .  ALPRAZolam (XANAX) 0.25 MG tablet, , Disp: , Rfl:  .  furosemide (LASIX) 40 MG tablet, , Disp: , Rfl:  .  glimepiride (AMARYL) 2 MG tablet, , Disp: , Rfl:  .  traMADol  (ULTRAM) 50 MG tablet, Take 50 mg by mouth every 6 (six) hours as needed., Disp: , Rfl: 1  Filed Vitals:   11/03/14 1047  BP: 134/65  Pulse: 77  Temp: 98 F (36.7 C)  Resp: 16  Height: 5\' 2"  (1.575 m)  Weight: 127 lb (57.607 kg)  SpO2: 100%    Body mass index is 23.22 kg/(m^2).           Review of Systems patient does complain of occasional chest pressure with exertion. Also has dyspnea on exertion and occasional orthopnea. Has history of weakness in her arms and legs. Denies lateralizing weakness, aphasia, amaurosis fugax, diplopia, blurred vision, or syncope. She does have chronic low back discomfort. Other systems negative and complete review of systems     Objective:   Physical Exam BP 134/65 mmHg  Pulse 77  Temp(Src) 98 F (36.7 C)  Resp 16  Ht 5\' 2"  (1.575 m)  Wt 127 lb (57.607 kg)  BMI 23.22 kg/m2  SpO2 100%  Gen.-alert and oriented x3 in no apparent distress HEENT normal for age Lungs no rhonchi or wheezing Cardiovascular regular rhythm no murmurs carotid pulses 3+ palpable no bruits audible Abdomen soft nontender no palpable masses Musculoskeletal free of  major deformities Skin clear -no rashes Neurologic normal Lower extremities-1-2+ femoral pulses palpable bilaterally. Absent popliteal and distal pulses. Both feet well perfused. No evidence of ischemia such as ulceration gangrene or fluctuance.  Today I ordered lower extremity arterial Doppler exam chart reviewed and interpreted. Patient has biphasic flow in both feet with ABIs of 0.77 on the right and 0.65 on the left.       Assessment:     Probable aortic stenosis and infrarenal aorta, causing bilateral hip and leg claudication with ambulation Degenerative disc disease with possible spinal stenosis causing back and leg symptoms GERD History of CVA Chronic exertional chest discomfort    Plan:     Discussed the situation with patient and her husband and they would like to proceed with  aortobifemoral angiography initially to see if aortic stenosis is indeed present and if she is candidate for aortic stent this may relieve her bilateral hip discomfort She is scheduled for possible spinal sterile or injection next week. If no findings on aortogram explain her situation that she should proceed with the spinal injection Scheduled angiography by Dr. on Thursday, August 4 with aortobifemoral angiogram and runoff  and possible PTA and stenting of either aortic stenosis or bilateral iliac stenosis if indicated

## 2014-11-06 ENCOUNTER — Telehealth: Payer: Self-pay | Admitting: Vascular Surgery

## 2014-11-06 MED FILL — Heparin Sodium (Porcine) 2 Unit/ML in Sodium Chloride 0.9%: INTRAMUSCULAR | Qty: 1000 | Status: AC

## 2014-11-06 MED FILL — Clopidogrel Bisulfate Tab 300 MG (Base Equiv): ORAL | Qty: 1 | Status: AC

## 2014-11-06 NOTE — Telephone Encounter (Signed)
-----   Message from Sharee Pimple, RN sent at 11/05/2014  2:00 PM EDT ----- Regarding: Schedule   ----- Message -----    From: Fransisco Hertz, MD    Sent: 11/05/2014  10:14 AM      To: [id number]  Shari [name] [id number] 1934-06-02   PROCEDURE: 1.  Bilateral common femoral artery cannulation under ultrasound guidance 2.  Placement of catheter in aorta 3.  Aortogram 4.  Angioplasty and stenting of aorta (iCAST 9 mm x 38 mm, 10 mm x 38 mm) 5.  Angioplasty and stenting of Left common iliac artery (Omnilink 8 mm x 19 mm)   Follow-up: 4 weeks with Dr. Hart Rochester

## 2014-11-06 NOTE — Telephone Encounter (Signed)
LM for pt re appt, dpm °

## 2014-11-23 ENCOUNTER — Encounter: Payer: Self-pay | Admitting: Vascular Surgery

## 2014-11-24 ENCOUNTER — Encounter: Payer: Self-pay | Admitting: Vascular Surgery

## 2014-11-24 ENCOUNTER — Ambulatory Visit (INDEPENDENT_AMBULATORY_CARE_PROVIDER_SITE_OTHER): Payer: Self-pay | Admitting: Vascular Surgery

## 2014-11-24 VITALS — BP 124/65 | HR 73 | Temp 97.1°F | Resp 16 | Ht 62.0 in | Wt 129.0 lb

## 2014-11-24 DIAGNOSIS — I739 Peripheral vascular disease, unspecified: Secondary | ICD-10-CM

## 2014-11-24 NOTE — Addendum Note (Signed)
Addended by: Adria Dill L on: 11/24/2014 02:43 PM   Modules accepted: Orders

## 2014-11-24 NOTE — Progress Notes (Signed)
Subjective:     Patient ID: Shari [name], female   DOB: 11-Dec-1934, 80 y.o.   MRN: [id number]  HPI this 80 year old female returns 2-1/2 weeks post insertion of aortic stent and left common iliac stent by Dr. Imogene Burn. I had seen the patient with bilateral lower extremity claudication symptoms and suspected aortic and iliac disease. She states that since the procedure she is completely free of claudication symptoms in both legs. There had been some question about whether she had neurogenic claudication and that has now been ruled out.  Past Medical History  Diagnosis Date  . Diabetes mellitus 2 years  . Hypertension   . Glaucoma   . High cholesterol   . GERD (gastroesophageal reflux disease)   . Diverticulitis   . IBS (irritable bowel syndrome)   . Osteoarthritis   . Stroke   . Anxiety     Social History  Substance Use Topics  . Smoking status: Never Smoker   . Smokeless tobacco: Never Used  . Alcohol Use: No    No family history on file.  Allergies  Allergen Reactions  . Codeine Diarrhea, Nausea And Vomiting and Other (See Comments)    All-over body aches, too  . Ivp Dye [Iodinated Diagnostic Agents] Nausea And Vomiting     Current outpatient prescriptions:  .  Acetaminophen (TYLENOL ARTHRITIS PAIN PO), Take by mouth., Disp: , Rfl:  .  atorvastatin (LIPITOR) 10 MG tablet, Take 10 mg by mouth at bedtime., Disp: , Rfl: 11 .  Calcium Carbonate-Vit D-Min (CALCIUM 1200 PO), Take 1 tablet by mouth daily. , Disp: , Rfl:  .  carvedilol (COREG) 25 MG tablet, Take 25 mg by mouth 2 (two) times daily with a meal.  , Disp: , Rfl:  .  clopidogrel (PLAVIX) 75 MG tablet, Take 1 tablet (75 mg total) by mouth daily., Disp: 90 tablet, Rfl: 3 .  DEXILANT 60 MG capsule, Take 60 mg by mouth daily. , Disp: , Rfl:  .  dicyclomine (BENTYL) 20 MG tablet, Take 20 mg by mouth every 6 (six) hours as needed for spasms. , Disp: , Rfl:  .  furosemide (LASIX) 40 MG tablet, Take 40 mg by mouth at bedtime. ,  Disp: , Rfl:  .  glipiZIDE (GLUCOTROL) 10 MG tablet, Take 20 mg by mouth daily before breakfast. , Disp: , Rfl:  .  isosorbide mononitrate (IMDUR) 60 MG 24 hr tablet, Take 60 mg by mouth daily. , Disp: , Rfl:  .  Multiple Vitamin (MULTIVITAMIN PO), Take 1 tablet by mouth daily. , Disp: , Rfl:  .  NITROSTAT 0.4 MG SL tablet, Place 0.4 mg under the tongue every 5 (five) minutes as needed. , Disp: , Rfl:  .  potassium chloride (K-DUR) 10 MEQ tablet, Take 10 mEq by mouth 2 (two) times daily. , Disp: , Rfl:  .  predniSONE (DELTASONE) 5 MG tablet, Take 5 mg by mouth daily with breakfast. , Disp: , Rfl:  .  ramipril (ALTACE) 10 MG capsule, Take 10 mg by mouth daily.  , Disp: , Rfl:  .  RANEXA 500 MG 12 hr tablet, Take 500 mg by mouth every 12 (twelve) hours., Disp: , Rfl: 2 .  ranitidine (ZANTAC) 300 MG tablet, Take 300 mg by mouth at bedtime. , Disp: , Rfl:  .  Vitamin D, Ergocalciferol, (DRISDOL) 50000 UNITS CAPS capsule, Take 50,000 Units by mouth every 7 (seven) days. Sunday, Disp: , Rfl:  .  aspirin 81 MG tablet, Take by mouth  daily.  , Disp: , Rfl:   Filed Vitals:   11/24/14 1115  BP: 124/65  Pulse: 73  Temp: 97.1 F (36.2 C)  Resp: 16  Height: 5\' 2"  (1.575 m)  Weight: 129 lb (58.514 kg)  SpO2: 99%    Body mass index is 23.59 kg/(m^2).           Review of Systems denies chest pain, dyspnea on exertion, PND, orthopnea, hemoptysis    Objective:   Physical Exam BP 124/65 mmHg  Pulse 73  Temp(Src) 97.1 F (36.2 C)  Resp 16  Ht 5\' 2"  (1.575 m)  Wt 129 lb (58.514 kg)  BMI 23.59 kg/m2  SpO2 99%  General alert and oriented 3 in no apparent distress Abdomen soft nontender with no masses 3+ femoral and dorsalis pedis pulse palpable bilaterally     Assessment:     Good initial result following aortic and left common iliac artery stenting with relief of claudication    Plan:     Return in 3 months with ABIs and duplex scan of aortoiliac system and see nurse  practitioner

## 2014-12-01 ENCOUNTER — Encounter: Payer: Medicare Other | Admitting: Vascular Surgery

## 2015-03-03 ENCOUNTER — Encounter: Payer: Self-pay | Admitting: Family

## 2015-03-08 ENCOUNTER — Ambulatory Visit (HOSPITAL_COMMUNITY)
Admission: RE | Admit: 2015-03-08 | Discharge: 2015-03-08 | Disposition: A | Discharge status: Home / Self Care | Payer: Medicare Other | Source: Ambulatory Visit | Attending: Family | Admitting: Family

## 2015-03-08 ENCOUNTER — Ambulatory Visit (INDEPENDENT_AMBULATORY_CARE_PROVIDER_SITE_OTHER): Payer: Medicare Other | Admitting: Family

## 2015-03-08 ENCOUNTER — Ambulatory Visit (INDEPENDENT_AMBULATORY_CARE_PROVIDER_SITE_OTHER)
Admission: RE | Admit: 2015-03-08 | Discharge: 2015-03-08 | Disposition: A | Discharge status: Home / Self Care | Payer: Medicare Other | Source: Ambulatory Visit | Attending: Vascular Surgery | Admitting: Vascular Surgery

## 2015-03-08 ENCOUNTER — Encounter: Payer: Self-pay | Admitting: Family

## 2015-03-08 VITALS — BP 133/62 | HR 59 | Ht 62.0 in | Wt 132.6 lb

## 2015-03-08 DIAGNOSIS — I7409 Other arterial embolism and thrombosis of abdominal aorta: Secondary | ICD-10-CM

## 2015-03-08 DIAGNOSIS — I739 Peripheral vascular disease, unspecified: Secondary | ICD-10-CM

## 2015-03-08 DIAGNOSIS — Z48812 Encounter for surgical aftercare following surgery on the circulatory system: Secondary | ICD-10-CM | POA: Diagnosis not present

## 2015-03-08 DIAGNOSIS — Z959 Presence of cardiac and vascular implant and graft, unspecified: Secondary | ICD-10-CM | POA: Diagnosis not present

## 2015-03-08 DIAGNOSIS — E7800 Pure hypercholesterolemia, unspecified: Secondary | ICD-10-CM | POA: Diagnosis not present

## 2015-03-08 DIAGNOSIS — E119 Type 2 diabetes mellitus without complications: Secondary | ICD-10-CM | POA: Diagnosis not present

## 2015-03-08 DIAGNOSIS — I10 Essential (primary) hypertension: Secondary | ICD-10-CM | POA: Insufficient documentation

## 2015-03-08 NOTE — Patient Instructions (Signed)
Peripheral Vascular Disease Peripheral vascular disease (PVD) is a disease of the blood vessels that are not part of your heart and brain. A simple term for PVD is poor circulation. In most cases, PVD narrows the blood vessels that carry blood from your heart to the rest of your body. This can result in a decreased supply of blood to your arms, legs, and internal organs, like your stomach or kidneys. However, it most often affects a person's lower legs and feet. There are two types of PVD.  Organic PVD. This is the more common type. It is caused by damage to the structure of blood vessels.  Functional PVD. This is caused by conditions that make blood vessels contract and tighten (spasm). Without treatment, PVD tends to get worse over time. PVD can also lead to acute ischemic limb. This is when an arm or limb suddenly has trouble getting enough blood. This is a medical emergency. CAUSES Each type of PVD has many different causes. The most common cause of PVD is buildup of a fatty material (plaque) inside of your arteries (atherosclerosis). Small amounts of plaque can break off from the walls of the blood vessels and become lodged in a smaller artery. This blocks blood flow and can cause acute ischemic limb. Other common causes of PVD include:  Blood clots that form inside of blood vessels.  Injuries to blood vessels.  Diseases that cause inflammation of blood vessels or cause blood vessel spasms.  Health behaviors and health history that increase your risk of developing PVD. RISK FACTORS  You may have a greater risk of PVD if you:  Have a family history of PVD.  Have certain medical conditions, including:  High cholesterol.  Diabetes.  High blood pressure (hypertension).  Coronary heart disease.  Past problems with blood clots.  Past injury, such as burns or a broken bone. These may have damaged blood vessels in your limbs.  Buerger disease. This is caused by inflamed blood  vessels in your hands and feet.  Some forms of arthritis.  Rare birth defects that affect the arteries in your legs.  Use tobacco.  Do not get enough exercise.  Are obese.  Are age 50 or older. SIGNS AND SYMPTOMS  PVD may cause many different symptoms. Your symptoms depend on what part of your body is not getting enough blood. Some common signs and symptoms include:  Cramps in your lower legs. This may be a symptom of poor leg circulation (claudication).  Pain and weakness in your legs while you are physically active that goes away when you rest (intermittent claudication).  Leg pain when at rest.  Leg numbness, tingling, or weakness.  Coldness in a leg or foot, especially when compared with the other leg.  Skin or hair changes. These can include:  Hair loss.  Shiny skin.  Pale or bluish skin.  Thick toenails.  Inability to get or maintain an erection (erectile dysfunction). People with PVD are more prone to developing ulcers and sores on their toes, feet, or legs. These may take longer than normal to heal. DIAGNOSIS Your health care provider may diagnose PVD from your signs and symptoms. The health care provider will also do a physical exam. You may have tests to find out what is causing your PVD and determine its severity. Tests may include:  Blood pressure recordings from your arms and legs and measurements of the strength of your pulses (pulse volume recordings).  Imaging studies using sound waves to take pictures of   the blood flow through your blood vessels (Doppler ultrasound).  Injecting a dye into your blood vessels before having imaging studies using:  X-rays (angiogram or arteriogram).  Computer-generated X-rays (CT angiogram).  A powerful electromagnetic field and a computer (magnetic resonance angiogram or MRA). TREATMENT Treatment for PVD depends on the cause of your condition and the severity of your symptoms. It also depends on your age. Underlying  causes need to be treated and controlled. These include long-lasting (chronic) conditions, such as diabetes, high cholesterol, and high blood pressure. You may need to first try making lifestyle changes and taking medicines. Surgery may be needed if these do not work. Lifestyle changes may include:  Quitting smoking.  Exercising regularly.  Following a low-fat, low-cholesterol diet. Medicines may include:  Blood thinners to prevent blood clots.  Medicines to improve blood flow.  Medicines to improve your blood cholesterol levels. Surgical procedures may include:  A procedure that uses an inflated balloon to open a blocked artery and improve blood flow (angioplasty).  A procedure to put in a tube (stent) to keep a blocked artery open (stent implant).  Surgery to reroute blood flow around a blocked artery (peripheral bypass surgery).  Surgery to remove dead tissue from an infected wound on the affected limb.  Amputation. This is surgical removal of the affected limb. This may be necessary in cases of acute ischemic limb that are not improved through medical or surgical treatments. HOME CARE INSTRUCTIONS  Take medicines only as directed by your health care provider.  Do not use any tobacco products, including cigarettes, chewing tobacco, or electronic cigarettes. If you need help quitting, ask your health care provider.  Lose weight if you are overweight, and maintain a healthy weight as directed by your health care provider.  Eat a diet that is low in fat and cholesterol. If you need help, ask your health care provider.  Exercise regularly. Ask your health care provider to suggest some good activities for you.  Use compression stockings or other mechanical devices as directed by your health care provider.  Take good care of your feet.  Wear comfortable shoes that fit well.  Check your feet often for any cuts or sores. SEEK MEDICAL CARE IF:  You have cramps in your legs  while walking.  You have leg pain when you are at rest.  You have coldness in a leg or foot.  Your skin changes.  You have erectile dysfunction.  You have cuts or sores on your feet that are not healing. SEEK IMMEDIATE MEDICAL CARE IF:  Your arm or leg turns cold and blue.  Your arms or legs become red, warm, swollen, painful, or numb.  You have chest pain or trouble breathing.  You suddenly have weakness in your face, arm, or leg.  You become very confused or lose the ability to speak.  You suddenly have a very bad headache or lose your vision.   This information is not intended to replace advice given to you by your health care provider. Make sure you discuss any questions you have with your health care provider.   Document Released: 04/27/2004 Document Revised: 04/10/2014 Document Reviewed: 08/28/2013 [employer] Interactive Patient Education 2016 [employer]  

## 2015-03-08 NOTE — Progress Notes (Signed)
[facility] [place] HISTORY AND PHYSICAL -PAD  History of Present Illness Shari [name] is a 80 y.o. female patient of Dr. Hart Rochester who is status post insertion of aortic stent and left common iliac stent by Dr. Imogene Burn in August of 2016.  Dr. Hart Rochester had seen the patient with bilateral lower extremity claudication symptoms and suspected aortic and iliac disease. She states that since the procedure she is completely free of claudication symptoms in both legs. There had been some question about whether she had neurogenic claudication and that has now been ruled out. The patient returns for routine surveillance of PAD.  The patient reports New Medical or Surgical History: had a nerve test today to help evaluate pain in the right hand that keeps her awake and "pseudogout" in her left thumb; she is seeing a rheumatologist.  She sees Dr. Dulce Sellar in [place] for CHF, has a Facilities manager.  She had a TIA in 2013. She states that Dr. Dulce Sellar requested a carotid duplex, performed at [facility], that showed no blockages (note that no carotid duplex results on file in EPIC).   Pt Diabetic: Yes, pt states last A1C was 8.2,  increased from 6.9 Pt smoker: non-smoker  Pt meds include: She Statin :Yes Betablocker: Yes ASA: No Other anticoagulants/antiplatelets: Plavix  Past Medical History  Diagnosis Date  . Diabetes mellitus 2 years  . Hypertension   . Glaucoma   . High cholesterol   . GERD (gastroesophageal reflux disease)   . Diverticulitis   . IBS (irritable bowel syndrome)   . Osteoarthritis   . Stroke   . Anxiety     Social History Social History  Substance Use Topics  . Smoking status: Never Smoker   . Smokeless tobacco: Never Used  . Alcohol Use: No    Family History No family history on file.  Past Surgical History  Procedure Laterality Date  . Foot surgery    . Pacemaker surgery      a-fib and was done july 2014  . Peripheral  vascular catheterization N/A 11/05/2014    Procedure: Abdominal Aortogram;  Surgeon: Fransisco Hertz, MD;  Location: [facility] INVASIVE CV LAB;  Service: Cardiovascular;  Laterality: N/A;    Allergies  Allergen Reactions  . Codeine Diarrhea, Nausea And Vomiting and Other (See Comments)    All-over body aches, too  . Ivp Dye [Iodinated Diagnostic Agents] Nausea And Vomiting    Current Outpatient Prescriptions  Medication Sig Dispense Refill  . Acetaminophen (TYLENOL ARTHRITIS PAIN PO) Take by mouth.    Marland Kitchen aspirin 81 MG tablet Take by mouth daily.      Marland Kitchen atorvastatin (LIPITOR) 10 MG tablet Take 10 mg by mouth at bedtime.  11  . Calcium Carbonate-Vit D-Min (CALCIUM 1200 PO) Take 1 tablet by mouth daily.     . carvedilol (COREG) 25 MG tablet Take 25 mg by mouth 2 (two) times daily with a meal.      . clopidogrel (PLAVIX) 75 MG tablet Take 1 tablet (75 mg total) by mouth daily. 90 tablet 3  . DEXILANT 60 MG capsule Take 60 mg by mouth daily.     Marland Kitchen dicyclomine (BENTYL) 20 MG tablet Take 20 mg by mouth every 6 (six) hours as needed for spasms.     . furosemide (LASIX) 40 MG tablet Take 40 mg by mouth at bedtime.     Marland Kitchen glipiZIDE (GLUCOTROL) 10 MG tablet Take 20 mg by mouth daily before breakfast.     .  isosorbide mononitrate (IMDUR) 60 MG 24 hr tablet Take 60 mg by mouth daily.     . Multiple Vitamin (MULTIVITAMIN PO) Take 1 tablet by mouth daily.     Marland Kitchen NITROSTAT 0.4 MG SL tablet Place 0.4 mg under the tongue every 5 (five) minutes as needed.     . potassium chloride (K-DUR) 10 MEQ tablet Take 10 mEq by mouth 2 (two) times daily.     . predniSONE (DELTASONE) 5 MG tablet Take 5 mg by mouth daily with breakfast.     . ramipril (ALTACE) 10 MG capsule Take 10 mg by mouth daily.      Marland Kitchen RANEXA 500 MG 12 hr tablet Take 500 mg by mouth every 12 (twelve) hours.  2  . ranitidine (ZANTAC) 300 MG tablet Take 300 mg by mouth at bedtime.     . Vitamin D, Ergocalciferol, (DRISDOL) 50000 UNITS CAPS capsule Take 50,000  Units by mouth every 7 (seven) days. Sunday     No current facility-administered medications for this visit.    ROS: See HPI for pertinent positives and negatives.   Physical Examination  Filed Vitals:   03/08/15 1527  BP: 133/62  Pulse: 59  Height: 5\' 2"  (1.575 m)  Weight: 132 lb 9.6 oz (60.147 kg)  SpO2: 96%   Body mass index is 24.25 kg/(m^2).  General: A&O x 3, WDWN. Gait: normal Eyes: PERRLA. Pulmonary: CTAB, without wheezes , rales or rhonchi. Cardiac: regular rhythm, no detected murmur.         Carotid Bruits Right Left   Negative Negative  Aorta is not palpable. Radial pulses: 3+ palpable and =                           VASCULAR EXAM: Extremities no ischemic changes, no Gangrene; no open wounds.                                                                                                          LE Pulses Right Left       FEMORAL  2+ palpable  2+ palpable        POPLITEAL  not palpable   not palpable       POSTERIOR TIBIAL  2+ palpable   2+ palpable        DORSALIS PEDIS      ANTERIOR TIBIAL 3+ palpable  2+ palpable    Abdomen: soft, NT, no palpable masses. Skin: no rashes, no ulcers. Musculoskeletal: no muscle wasting or atrophy.  Neurologic: A&O X 3; Appropriate Affect,  MOTOR FUNCTION:  moving all extremities equally, motor strength 5/5 throughout. Speech is fluent/normal.  CN 2-12 intact except for slight hearing loss.    Non-Invasive Vascular Imaging: DATE: 03/08/2015 LOWER EXTREMITY ARTERIAL DUPLEX EVALUATION    INDICATION: PVD    PREVIOUS INTERVENTION(S): Left common iliac artery stent  / aortic stent placed 11/05/2014    DUPLEX EXAM: Lower extremity arterial duplex    RIGHT  LEFT   Peak Systolic Velocity (cm/s) Ratio (if abnormal)  Waveform  Peak Systolic Velocity (cm/s) Ratio (if abnormal) Waveform  130  T Common Femoral Artery 136  T  57  B Deep Femoral Artery 72  B  99  T Superficial Femoral Artery Proximal 78  T  97  T  Superficial Femoral Artery Mid 86  T  87  T Superficial Femoral Artery Distal 77  T  70  T Popliteal Artery 61  T  51  T Posterior Tibial Artery Dist 52  B  66  T Anterior Tibial Artery Distal 84  B  21  B Peroneal Artery Distal 57  B  1.1 Today's ABI / TBI 1.2  .77 Previous ABI / TBI ( 11/03/14 ) .65    Waveform:    M - Monophasic       B - Biphasic       T - Triphasic  If Ankle Brachial Index (ABI) or Toe Brachial Index (TBI) performed, please see complete report     ADDITIONAL FINDINGS:     IMPRESSION: 1. Normal lower extremity arterial duplex bilaterally    Compared to the previous exam:  Increase in ABI's post stent placement.     ASSESSMENT: Shari [name] is a 80 y.o. female who is status post insertion of aortic stent and left common iliac stent by Dr. Imogene Burn in August of 2016.  Dr. Hart Rochester had seen the patient with bilateral lower extremity claudication symptoms and suspected aortic and iliac disease. She states that since the procedure she is completely free of claudication symptoms in both legs. There had been some question about whether she had neurogenic claudication and that has now been ruled out. She has no signs of ischemia in either LE. Today's bilateral LE arterial duplex suggests normal normal LE arterial duplex bilaterally. Bilateral ABI's and TBI's are now normal with all triphasic waveforms.  Fortunately she has never used tobacco. Her primary atherosclerotic risk factor is her uncontrolled DM. I advised her to work closely with her PCP to get this under as good control as possible.   PLAN:  Based on the patient's vascular studies and examination, pt will return to clinic in 3 months with ABIs and duplex scan of aortoiliac system.  I discussed in depth with the patient the nature of atherosclerosis, and emphasized the importance of maximal medical management including strict control of blood pressure, blood glucose, and lipid levels, obtaining regular exercise,  and continued cessation of smoking.  The patient is aware that without maximal medical management the underlying atherosclerotic disease process will progress, limiting the benefit of any interventions.  The patient was given information about PAD including signs, symptoms, treatment, what symptoms should prompt the patient to seek immediate medical care, and risk reduction measures to take.  Charisse March, RN, MSN, FNP-C [facility] Phone: [phone]  Clinic MD: Myra Gianotti  03/08/2015 3:18 PM

## 2015-04-02 DIAGNOSIS — Z01810 Encounter for preprocedural cardiovascular examination: Secondary | ICD-10-CM

## 2015-04-02 DIAGNOSIS — Z1239 Encounter for other screening for malignant neoplasm of breast: Secondary | ICD-10-CM

## 2015-04-02 HISTORY — DX: Encounter for preprocedural cardiovascular examination: Z01.810

## 2015-04-02 HISTORY — DX: Encounter for other screening for malignant neoplasm of breast: Z12.39

## 2015-05-21 DIAGNOSIS — M069 Rheumatoid arthritis, unspecified: Secondary | ICD-10-CM

## 2015-05-21 DIAGNOSIS — I110 Hypertensive heart disease with heart failure: Secondary | ICD-10-CM

## 2015-05-21 HISTORY — DX: Hypertensive heart disease with heart failure: I11.0

## 2015-05-21 HISTORY — DX: Rheumatoid arthritis, unspecified: M06.9

## 2015-06-28 ENCOUNTER — Encounter (HOSPITAL_COMMUNITY): Payer: Medicare Other

## 2015-06-28 ENCOUNTER — Ambulatory Visit: Payer: Medicare Other | Admitting: Family

## 2015-06-29 ENCOUNTER — Encounter: Payer: Self-pay | Admitting: Family

## 2015-07-07 ENCOUNTER — Ambulatory Visit (HOSPITAL_COMMUNITY)
Admission: RE | Admit: 2015-07-07 | Discharge: 2015-07-07 | Disposition: A | Discharge status: Home / Self Care | Payer: Medicare Other | Source: Ambulatory Visit | Attending: Family | Admitting: Family

## 2015-07-07 ENCOUNTER — Encounter: Payer: Self-pay | Admitting: Family

## 2015-07-07 ENCOUNTER — Ambulatory Visit (INDEPENDENT_AMBULATORY_CARE_PROVIDER_SITE_OTHER)
Admission: RE | Admit: 2015-07-07 | Discharge: 2015-07-07 | Disposition: A | Discharge status: Home / Self Care | Payer: Medicare Other | Source: Ambulatory Visit | Attending: Family | Admitting: Family

## 2015-07-07 ENCOUNTER — Ambulatory Visit (INDEPENDENT_AMBULATORY_CARE_PROVIDER_SITE_OTHER): Payer: Medicare Other | Admitting: Family

## 2015-07-07 VITALS — BP 104/60 | HR 67 | Temp 98.2°F | Resp 14 | Ht 62.0 in | Wt 128.0 lb

## 2015-07-07 DIAGNOSIS — Z48812 Encounter for surgical aftercare following surgery on the circulatory system: Secondary | ICD-10-CM | POA: Diagnosis not present

## 2015-07-07 DIAGNOSIS — Z959 Presence of cardiac and vascular implant and graft, unspecified: Secondary | ICD-10-CM | POA: Diagnosis not present

## 2015-07-07 DIAGNOSIS — I7409 Other arterial embolism and thrombosis of abdominal aorta: Secondary | ICD-10-CM

## 2015-07-07 DIAGNOSIS — Z95820 Peripheral vascular angioplasty status with implants and grafts: Secondary | ICD-10-CM | POA: Diagnosis not present

## 2015-07-07 DIAGNOSIS — I10 Essential (primary) hypertension: Secondary | ICD-10-CM | POA: Diagnosis not present

## 2015-07-07 DIAGNOSIS — E785 Hyperlipidemia, unspecified: Secondary | ICD-10-CM | POA: Diagnosis not present

## 2015-07-07 DIAGNOSIS — Z9862 Peripheral vascular angioplasty status: Secondary | ICD-10-CM

## 2015-07-07 DIAGNOSIS — I739 Peripheral vascular disease, unspecified: Secondary | ICD-10-CM

## 2015-07-07 NOTE — Patient Instructions (Signed)
Peripheral Vascular Disease Peripheral vascular disease (PVD) is a disease of the blood vessels that are not part of your heart and brain. A simple term for PVD is poor circulation. In most cases, PVD narrows the blood vessels that carry blood from your heart to the rest of your body. This can result in a decreased supply of blood to your arms, legs, and internal organs, like your stomach or kidneys. However, it most often affects a person's lower legs and feet. There are two types of PVD.  Organic PVD. This is the more common type. It is caused by damage to the structure of blood vessels.  Functional PVD. This is caused by conditions that make blood vessels contract and tighten (spasm). Without treatment, PVD tends to get worse over time. PVD can also lead to acute ischemic limb. This is when an arm or limb suddenly has trouble getting enough blood. This is a medical emergency. CAUSES Each type of PVD has many different causes. The most common cause of PVD is buildup of a fatty material (plaque) inside of your arteries (atherosclerosis). Small amounts of plaque can break off from the walls of the blood vessels and become lodged in a smaller artery. This blocks blood flow and can cause acute ischemic limb. Other common causes of PVD include:  Blood clots that form inside of blood vessels.  Injuries to blood vessels.  Diseases that cause inflammation of blood vessels or cause blood vessel spasms.  Health behaviors and health history that increase your risk of developing PVD. RISK FACTORS  You may have a greater risk of PVD if you:  Have a family history of PVD.  Have certain medical conditions, including:  High cholesterol.  Diabetes.  High blood pressure (hypertension).  Coronary heart disease.  Past problems with blood clots.  Past injury, such as burns or a broken bone. These may have damaged blood vessels in your limbs.  Buerger disease. This is caused by inflamed blood  vessels in your hands and feet.  Some forms of arthritis.  Rare birth defects that affect the arteries in your legs.  Use tobacco.  Do not get enough exercise.  Are obese.  Are age 50 or older. SIGNS AND SYMPTOMS  PVD may cause many different symptoms. Your symptoms depend on what part of your body is not getting enough blood. Some common signs and symptoms include:  Cramps in your lower legs. This may be a symptom of poor leg circulation (claudication).  Pain and weakness in your legs while you are physically active that goes away when you rest (intermittent claudication).  Leg pain when at rest.  Leg numbness, tingling, or weakness.  Coldness in a leg or foot, especially when compared with the other leg.  Skin or hair changes. These can include:  Hair loss.  Shiny skin.  Pale or bluish skin.  Thick toenails.  Inability to get or maintain an erection (erectile dysfunction). People with PVD are more prone to developing ulcers and sores on their toes, feet, or legs. These may take longer than normal to heal. DIAGNOSIS Your health care provider may diagnose PVD from your signs and symptoms. The health care provider will also do a physical exam. You may have tests to find out what is causing your PVD and determine its severity. Tests may include:  Blood pressure recordings from your arms and legs and measurements of the strength of your pulses (pulse volume recordings).  Imaging studies using sound waves to take pictures of   the blood flow through your blood vessels (Doppler ultrasound).  Injecting a dye into your blood vessels before having imaging studies using:  X-rays (angiogram or arteriogram).  Computer-generated X-rays (CT angiogram).  A powerful electromagnetic field and a computer (magnetic resonance angiogram or MRA). TREATMENT Treatment for PVD depends on the cause of your condition and the severity of your symptoms. It also depends on your age. Underlying  causes need to be treated and controlled. These include long-lasting (chronic) conditions, such as diabetes, high cholesterol, and high blood pressure. You may need to first try making lifestyle changes and taking medicines. Surgery may be needed if these do not work. Lifestyle changes may include:  Quitting smoking.  Exercising regularly.  Following a low-fat, low-cholesterol diet. Medicines may include:  Blood thinners to prevent blood clots.  Medicines to improve blood flow.  Medicines to improve your blood cholesterol levels. Surgical procedures may include:  A procedure that uses an inflated balloon to open a blocked artery and improve blood flow (angioplasty).  A procedure to put in a tube (stent) to keep a blocked artery open (stent implant).  Surgery to reroute blood flow around a blocked artery (peripheral bypass surgery).  Surgery to remove dead tissue from an infected wound on the affected limb.  Amputation. This is surgical removal of the affected limb. This may be necessary in cases of acute ischemic limb that are not improved through medical or surgical treatments. HOME CARE INSTRUCTIONS  Take medicines only as directed by your health care provider.  Do not use any tobacco products, including cigarettes, chewing tobacco, or electronic cigarettes. If you need help quitting, ask your health care provider.  Lose weight if you are overweight, and maintain a healthy weight as directed by your health care provider.  Eat a diet that is low in fat and cholesterol. If you need help, ask your health care provider.  Exercise regularly. Ask your health care provider to suggest some good activities for you.  Use compression stockings or other mechanical devices as directed by your health care provider.  Take good care of your feet.  Wear comfortable shoes that fit well.  Check your feet often for any cuts or sores. SEEK MEDICAL CARE IF:  You have cramps in your legs  while walking.  You have leg pain when you are at rest.  You have coldness in a leg or foot.  Your skin changes.  You have erectile dysfunction.  You have cuts or sores on your feet that are not healing. SEEK IMMEDIATE MEDICAL CARE IF:  Your arm or leg turns cold and blue.  Your arms or legs become red, warm, swollen, painful, or numb.  You have chest pain or trouble breathing.  You suddenly have weakness in your face, arm, or leg.  You become very confused or lose the ability to speak.  You suddenly have a very bad headache or lose your vision.   This information is not intended to replace advice given to you by your health care provider. Make sure you discuss any questions you have with your health care provider.   Document Released: 04/27/2004 Document Revised: 04/10/2014 Document Reviewed: 08/28/2013 [employer] Interactive Patient Education 2016 [employer]  

## 2015-07-07 NOTE — Progress Notes (Signed)
[facility] [place] HISTORY AND PHYSICAL -PAD  History of Present Illness Shari [name] is a 81 y.o. female patient of Dr. Hart Rochester who is status post insertion of aortic stent and left common iliac stent by Dr. Imogene Burn in August of 2016.  Dr. Hart Rochester had seen the patient with bilateral lower extremity claudication symptoms and suspected aortic and iliac disease. She states that since the procedure she is completely free of claudication symptoms in both legs. There had been some question about whether she had neurogenic claudication and that has now been ruled out. The patient returns for routine surveillance of PAD.  The patient reports New Medical or Surgical History: her prednisone was stopped and her arthritis pain is worsening. Her walking does not seem to be limited by her arthritis pain.  She is seeing a rheumatologist.  She sees Dr. Dulce Sellar in [place] for CHF, has a Facilities manager.   She had a TIA in 2013. She states that Dr. Dulce Sellar requested a carotid duplex, performed at [facility], that showed no blockages (note that no carotid duplex results on file in EPIC).    Pt Diabetic: Yes, pt states last A1C was 7.2, increased from 6.9 Pt smoker: non-smoker  Pt meds include:  Statin :Yes Betablocker: Yes ASA: No Other anticoagulants/antiplatelets: Plavix      Past Medical History  Diagnosis Date  . Diabetes mellitus 2 years  . Hypertension   . Glaucoma   . High cholesterol   . GERD (gastroesophageal reflux disease)   . Diverticulitis   . IBS (irritable bowel syndrome)   . Osteoarthritis   . Stroke (HCC)   . Anxiety     Social History Social History  Substance Use Topics  . Smoking status: Never Smoker   . Smokeless tobacco: Never Used  . Alcohol Use: No    Family History No family history on file.  Past Surgical History  Procedure Laterality Date  . Foot surgery    . Pacemaker surgery      a-fib and was done  july 2014  . Peripheral vascular catheterization N/A 11/05/2014    Procedure: Abdominal Aortogram;  Surgeon: Fransisco Hertz, MD;  Location: [facility] INVASIVE CV LAB;  Service: Cardiovascular;  Laterality: N/A;    Allergies  Allergen Reactions  . Codeine Diarrhea, Nausea And Vomiting and Other (See Comments)    All-over body aches, too  . Ivp Dye [Iodinated Diagnostic Agents] Nausea And Vomiting    Current Outpatient Prescriptions  Medication Sig Dispense Refill  . atorvastatin (LIPITOR) 10 MG tablet Take 10 mg by mouth at bedtime.  11  . Calcium Carbonate-Vit D-Min (CALCIUM 1200 PO) Take 1 tablet by mouth daily.     . carvedilol (COREG) 25 MG tablet Take 25 mg by mouth 2 (two) times daily with a meal.      . clopidogrel (PLAVIX) 75 MG tablet Take 1 tablet (75 mg total) by mouth daily. 90 tablet 3  . DEXILANT 60 MG capsule Take 60 mg by mouth daily.     Marland Kitchen dicyclomine (BENTYL) 20 MG tablet Take 20 mg by mouth every 6 (six) hours as needed for spasms.     . furosemide (LASIX) 40 MG tablet Take 40 mg by mouth at bedtime.     Marland Kitchen glipiZIDE (GLUCOTROL) 10 MG tablet Take 20 mg by mouth 2 (two) times daily before a meal.     . isosorbide mononitrate (IMDUR) 60 MG 24 hr tablet Take 60 mg by mouth  daily.     . Multiple Vitamin (MULTIVITAMIN PO) Take 1 tablet by mouth daily.     Marland Kitchen NITROSTAT 0.4 MG SL tablet Place 0.4 mg under the tongue every 5 (five) minutes as needed.     . potassium chloride (K-DUR) 10 MEQ tablet Take 10 mEq by mouth 2 (two) times daily.     . predniSONE (DELTASONE) 5 MG tablet Take 5 mg by mouth daily with breakfast.     . ramipril (ALTACE) 10 MG capsule Take 10 mg by mouth daily.      Marland Kitchen RANEXA 500 MG 12 hr tablet Take 500 mg by mouth every 12 (twelve) hours.  2  . ranitidine (ZANTAC) 300 MG tablet Take 300 mg by mouth at bedtime.     . Vitamin D, Ergocalciferol, (DRISDOL) 50000 UNITS CAPS capsule Take 50,000 Units by mouth every 7 (seven) days. Sunday    . Acetaminophen (TYLENOL  ARTHRITIS PAIN PO) Take by mouth. Reported on 07/07/2015    . aspirin 81 MG tablet Take by mouth daily. Reported on 07/07/2015     No current facility-administered medications for this visit.    ROS: See HPI for pertinent positives and negatives.   Physical Examination  Filed Vitals:   07/07/15 1107  BP: 104/60  Pulse: 67  Temp: 98.2 F (36.8 C)  TempSrc: Oral  Resp: 14  Height: 5\' 2"  (1.575 m)  Weight: 128 lb (58.06 kg)  SpO2: 96%   Body mass index is 23.41 kg/(m^2).  General: A&O x 3, WDWN. Gait: normal Eyes: PERRLA. Pulmonary: CTAB, without wheezes , rales or rhonchi. Cardiac: regular rhythm, no detected murmur.     Carotid Bruits Right Left   Negative Negative  Aorta is not palpable. Radial pulses: 3+ palpable and =   VASCULAR EXAM: Extremities no ischemic changes, no Gangrene; no open wounds.     LE Pulses Right Left   FEMORAL 2+ palpable 2+ palpable    POPLITEAL not palpable  not palpable   POSTERIOR TIBIAL 2+ palpable  1+ palpable    DORSALIS PEDIS  ANTERIOR TIBIAL 2+palpable  1+ palpable    Abdomen: soft, NT, no palpable masses. Skin: no rashes, no ulcers. Musculoskeletal: no muscle wasting or atrophy. Neurologic: A&O X 3; Appropriate Affect, MOTOR FUNCTION: moving all extremities equally, motor strength 5/5 throughout. Speech is fluent/normal.  CN 2-12 intact except for slight hearing loss.          Non-Invasive Vascular Imaging: DATE: 07/07/2015 ABI: RIGHT: 1.0 (1.14, 03/08/15), Waveforms: triphasic, TBI: 0.86;  LEFT: 1.02 (1.21), Waveforms: triphasic, TBI: 0.98  DUPLEX SCAN OF Left aorto-iliac system: Increased velocities in aortic stent, unable to determine stenosis vs stent rigidity/caliber. Could not identify  posterior wall of stent. Increased velocities through left CIA stent which appears widely patent.  ASSESSMENT: Shari [name] is a 81 y.o. female who is status post insertion of aortic stent and left common iliac stent by Dr. 70 in August of 2016.  Dr. [id number] had seen the patient with bilateral lower extremity claudication symptoms and suspected aortic and iliac disease. She states that since the procedure she is completely free of claudication symptoms in both legs. There had been some question about whether she had neurogenic claudication and that has now been ruled out. She has no signs of ischemia in either LE. Today's left aortoiliac duplex suggests increased velocities through left CIA stent which appears widely patent. ABI's remain normal in both lower extremities with triphasic waveforms. TBI's are also normal.  Fortunately she has never used tobacco. Her primary atherosclerotic risk factor is her almost controlled DM. I advised her to work closely with her PCP to get this under as good control as possible.   PLAN:  Based on the patient's vascular studies and examination, pt will return to clinic in 3 months with ABI's and left aortoiliac duplex.   I discussed in depth with the patient the nature of atherosclerosis, and emphasized the importance of maximal medical management including strict control of blood pressure, blood glucose, and lipid levels, obtaining regular exercise, and continued cessation of smoking.  The patient is aware that without maximal medical management the underlying atherosclerotic disease process will progress, limiting the benefit of any interventions.  The patient was given information about PAD including signs, symptoms, treatment, what symptoms should prompt the patient to seek immediate medical care, and risk reduction measures to take.  Charisse March, RN, MSN, FNP-C [facility] Phone: [phone]  Clinic MD:  Edilia Bo  07/07/2015 11:46 AM

## 2015-10-15 ENCOUNTER — Encounter: Payer: Self-pay | Admitting: Family

## 2015-10-20 ENCOUNTER — Ambulatory Visit (HOSPITAL_COMMUNITY)
Admission: RE | Admit: 2015-10-20 | Discharge: 2015-10-20 | Disposition: A | Discharge status: Home / Self Care | Payer: Medicare Other | Source: Ambulatory Visit | Attending: Family | Admitting: Family

## 2015-10-20 ENCOUNTER — Ambulatory Visit (INDEPENDENT_AMBULATORY_CARE_PROVIDER_SITE_OTHER): Payer: Medicare Other | Admitting: Family

## 2015-10-20 ENCOUNTER — Ambulatory Visit (INDEPENDENT_AMBULATORY_CARE_PROVIDER_SITE_OTHER)
Admission: RE | Admit: 2015-10-20 | Discharge: 2015-10-20 | Disposition: A | Discharge status: Home / Self Care | Payer: Medicare Other | Source: Ambulatory Visit | Attending: Family | Admitting: Family

## 2015-10-20 ENCOUNTER — Encounter: Payer: Self-pay | Admitting: Family

## 2015-10-20 VITALS — BP 100/60 | HR 69 | Temp 97.6°F | Resp 12 | Ht 62.0 in | Wt 121.0 lb

## 2015-10-20 DIAGNOSIS — I739 Peripheral vascular disease, unspecified: Secondary | ICD-10-CM | POA: Diagnosis not present

## 2015-10-20 DIAGNOSIS — Z9862 Peripheral vascular angioplasty status: Secondary | ICD-10-CM

## 2015-10-20 DIAGNOSIS — Z959 Presence of cardiac and vascular implant and graft, unspecified: Secondary | ICD-10-CM | POA: Diagnosis not present

## 2015-10-20 DIAGNOSIS — K219 Gastro-esophageal reflux disease without esophagitis: Secondary | ICD-10-CM | POA: Diagnosis not present

## 2015-10-20 DIAGNOSIS — I10 Essential (primary) hypertension: Secondary | ICD-10-CM | POA: Insufficient documentation

## 2015-10-20 DIAGNOSIS — I7409 Other arterial embolism and thrombosis of abdominal aorta: Secondary | ICD-10-CM

## 2015-10-20 DIAGNOSIS — E1151 Type 2 diabetes mellitus with diabetic peripheral angiopathy without gangrene: Secondary | ICD-10-CM

## 2015-10-20 DIAGNOSIS — R0989 Other specified symptoms and signs involving the circulatory and respiratory systems: Secondary | ICD-10-CM | POA: Diagnosis present

## 2015-10-20 DIAGNOSIS — E119 Type 2 diabetes mellitus without complications: Secondary | ICD-10-CM | POA: Diagnosis not present

## 2015-10-20 DIAGNOSIS — E7800 Pure hypercholesterolemia, unspecified: Secondary | ICD-10-CM | POA: Insufficient documentation

## 2015-10-20 DIAGNOSIS — F419 Anxiety disorder, unspecified: Secondary | ICD-10-CM | POA: Diagnosis not present

## 2015-10-20 DIAGNOSIS — Z48812 Encounter for surgical aftercare following surgery on the circulatory system: Secondary | ICD-10-CM

## 2015-10-20 NOTE — Progress Notes (Signed)
[facility] [place]   CC: Follow up peripheral artery occlusive disease  History of Present Illness TERESIA MYINT is a 81 y.o. female patient of Dr. Hart Rochester who is status post insertion of aortic stent and left common iliac stent by Dr. Imogene Burn in August of 2016.  Dr. Hart Rochester had seen the patient with bilateral lower extremity claudication symptoms and suspected aortic and iliac disease. She states that since the procedure she is completely free of claudication symptoms in both legs. There had been some question about whether she had neurogenic claudication and that has now been ruled out. The patient returns for routine surveillance of PAD.  The patient reports New Medical or Surgical History: will have right hand surgery in August 2017 for carpal tunnel syndrome.  Her walking does not seem to be limited by her arthritis pain. She is seeing a rheumatologist.  She sees Dr. Dulce Sellar in [place] for CHF, has a Facilities manager.   She had a TIA in 2013. She states that Dr. Dulce Sellar requested a carotid duplex, performed at [facility], that showed no blockages (note that no carotid duplex results on file in [facility]).   Pt states she has problems with recurrent constipation and gas.   Pt Diabetic: Yes, pt states last A1C was 8.?, increased from 6.9, is working closely with her PCP Pt smoker: non-smoker  Pt meds include:  Statin :Yes Betablocker: Yes ASA: No Other anticoagulants/antiplatelets: Plavix     Past Medical History  Diagnosis Date  . Diabetes mellitus 2 years  . Hypertension   . Glaucoma   . High cholesterol   . GERD (gastroesophageal reflux disease)   . Diverticulitis   . IBS (irritable bowel syndrome)   . Osteoarthritis   . Stroke (HCC)   . Anxiety     Social History Social History  Substance Use Topics  . Smoking status: Never Smoker   . Smokeless tobacco: Never Used  . Alcohol Use: No    Family History No family  history on file.  Past Surgical History  Procedure Laterality Date  . Foot surgery    . Pacemaker surgery      a-fib and was done july 2014  . Peripheral vascular catheterization N/A 11/05/2014    Procedure: Abdominal Aortogram;  Surgeon: Fransisco Hertz, MD;  Location: [facility] INVASIVE CV LAB;  Service: Cardiovascular;  Laterality: N/A;    Allergies  Allergen Reactions  . Codeine Diarrhea, Nausea And Vomiting and Other (See Comments)    All-over body aches, too  . Ivp Dye [Iodinated Diagnostic Agents] Nausea And Vomiting    Current Outpatient Prescriptions  Medication Sig Dispense Refill  . Acetaminophen (TYLENOL ARTHRITIS PAIN PO) Take by mouth. Reported on 07/07/2015    . atorvastatin (LIPITOR) 10 MG tablet Take 10 mg by mouth at bedtime.  11  . Calcium Carbonate-Vit D-Min (CALCIUM 1200 PO) Take 1 tablet by mouth daily.     . carvedilol (COREG) 25 MG tablet Take 25 mg by mouth 2 (two) times daily with a meal.      . clopidogrel (PLAVIX) 75 MG tablet Take 1 tablet (75 mg total) by mouth daily. 90 tablet 3  . DEXILANT 60 MG capsule Take 60 mg by mouth daily.     Marland Kitchen dicyclomine (BENTYL) 20 MG tablet Take 20 mg by mouth every 6 (six) hours as needed for spasms.     . furosemide (LASIX) 40 MG tablet Take 40 mg by mouth at bedtime.     Marland Kitchen  glipiZIDE (GLUCOTROL) 10 MG tablet Take 20 mg by mouth 2 (two) times daily before a meal.     . isosorbide mononitrate (IMDUR) 60 MG 24 hr tablet Take 60 mg by mouth daily.     . Multiple Vitamin (MULTIVITAMIN PO) Take 1 tablet by mouth daily.     Marland Kitchen NITROSTAT 0.4 MG SL tablet Place 0.4 mg under the tongue every 5 (five) minutes as needed.     . potassium chloride (K-DUR) 10 MEQ tablet Take 10 mEq by mouth 2 (two) times daily.     . ramipril (ALTACE) 10 MG capsule Take 10 mg by mouth daily.      Marland Kitchen RANEXA 500 MG 12 hr tablet Take 500 mg by mouth every 12 (twelve) hours.  2  . ranitidine (ZANTAC) 300 MG tablet Take 300 mg by mouth at bedtime.     . Vitamin D,  Ergocalciferol, (DRISDOL) 50000 UNITS CAPS capsule Take 50,000 Units by mouth every 7 (seven) days. Sunday    . aspirin 81 MG tablet Take by mouth daily. Reported on 10/20/2015    . predniSONE (DELTASONE) 5 MG tablet Take 5 mg by mouth daily with breakfast. Reported on 10/20/2015     No current facility-administered medications for this visit.    ROS: See HPI for pertinent positives and negatives.   Physical Examination  Filed Vitals:   10/20/15 1025  BP: 100/60  Pulse: 69  Temp: 97.6 F (36.4 C)  TempSrc: Oral  Resp: 12  Height: 5\' 2"  (1.575 m)  Weight: 121 lb (54.885 kg)  SpO2: 95%   Body mass index is 22.13 kg/(m^2).  General: A&O x 3, WDWN. Gait: normal Eyes: PERRLA. Pulmonary: Respirations are non labored, CTAB, Cardiac: regular rhythm, no detected murmur.Pacemaker/defrillator subcutaneous left upper chest.    Carotid Bruits Right Left   Negative Negative  Aorta is not palpable. Radial pulses: 3+ palpable and =   VASCULAR EXAM: Extremities no ischemic changes, no Gangrene; no open wounds.     LE Pulses Right Left   FEMORAL 2+ palpable 2+ palpable    POPLITEAL not palpable  not palpable   POSTERIOR TIBIAL 2+ palpable  1+ palpable    DORSALIS PEDIS  ANTERIOR TIBIAL 2+palpable  1+ palpable    Abdomen: soft, NT, no palpable masses. Skin: no rashes, no ulcers. Musculoskeletal: no muscle wasting or atrophy.Arthritic changes in hands. Neurologic: A&O X 3; Appropriate Affect, MOTOR FUNCTION: moving all extremities equally, motor strength 4/5 throughout. Speech is fluent/normal.  CN 2-12 intact except for considerable hearing loss.                Non-Invasive Vascular Imaging: DATE: 10/20/2015  ILIAC ARTERY  STENT EVALUATION    INDICATION: Follow up stent    PREVIOUS INTERVENTION(S): Aortic stent and left CIA stent paced 11/05/2014    DUPLEX EXAM:     RIGHT  LEFT   Peak Systolic Velocity (cm/s) Ratio (if abnormal) Waveform  Peak Systolic Velocity (cm/s) Ratio (if abnormal) Waveform     Aorta - Distal 234  B     Artery - Proximal to Stent 153  B     Stent - Proximal 188  B     Stent - Mid 213  B     Stent - Distal 158  B     Artery - Distal to Stent 77  B  1.2 Today's ABI / TBI 1.2  1.0 Previous ABI / TBI ( 07/07/15 ) 1.0-    Waveform:  M - Monophasic       B - Biphasic       T - Triphasic  If Ankle Brachial Index (ABI) or Toe Brachial Index (TBI) performed, please see complete report     ADDITIONAL FINDINGS:     IMPRESSION: 1. Suboptimal visualization of the aorta and aortic stent due to bowl gas and stent shadowing 2. Patent left common iliac artery stent with no focal stenosis visualized    Compared to the previous exam:  Unable to obtain increased velocity in stent and aorta as demonstrated on prior exam    ABI:  RIGHT: 1.2 (1.0, 07/07/15), Waveforms: triphasic;  LEFT: 1.2 (1.0), Waveforms: triphasic   ASSESSMENT: CALEI PARRAL is a 81 y.o. female who is status post insertion of aortic stent and left common iliac stent by Dr. Imogene Burn in August of 2016.  Dr. Hart Rochester had seen the patient with bilateral lower extremity claudication symptoms and suspected aortic and iliac disease. She states that since the procedure she is completely free of claudication symptoms in both legs. There had been some question about whether she had neurogenic claudication and that has now been ruled out. She has no signs of ischemia in either LE.  Today's left aortoiliac duplex had suboptimal visualization of the aorta and aortic stent due to bowl gas and stent shadowing. Patent left common iliac artery stent with no focal stenosis visualized. ABI's remain normal in both lower extremities with triphasic  waveforms. TBI's are also normal.   Fortunately she has never used tobacco. Her primary atherosclerotic risk factor is her uncontrolled DM. I advised her to work closely with her PCP to get this under as good control as possible.   PLAN:  Graduated walking program discussed and how to achieve safely.  Based on the patient's vascular studies and examination, pt will return to clinic in 6 months with ABI's and left aortoiliac duplex.  I discussed in depth with the patient the nature of atherosclerosis, and emphasized the importance of maximal medical management including strict control of blood pressure, blood glucose, and lipid levels, obtaining regular exercise, and continued cessation of smoking.  The patient is aware that without maximal medical management the underlying atherosclerotic disease process will progress, limiting the benefit of any interventions.  The patient was given information about PAD including signs, symptoms, treatment, what symptoms should prompt the patient to seek immediate medical care, and risk reduction measures to take.  Charisse March, RN, MSN, FNP-C [facility] Phone: [phone]  Clinic MD: Edilia Bo  10/20/2015 10:35 AM

## 2015-10-20 NOTE — Patient Instructions (Addendum)
Peripheral Vascular Disease Peripheral vascular disease (PVD) is a disease of the blood vessels that are not part of your heart and brain. A simple term for PVD is poor circulation. In most cases, PVD narrows the blood vessels that carry blood from your heart to the rest of your body. This can result in a decreased supply of blood to your arms, legs, and internal organs, like your stomach or kidneys. However, it most often affects a person's lower legs and feet. There are two types of PVD.  Organic PVD. This is the more common type. It is caused by damage to the structure of blood vessels.  Functional PVD. This is caused by conditions that make blood vessels contract and tighten (spasm). Without treatment, PVD tends to get worse over time. PVD can also lead to acute ischemic limb. This is when an arm or limb suddenly has trouble getting enough blood. This is a medical emergency. CAUSES Each type of PVD has many different causes. The most common cause of PVD is buildup of a fatty material (plaque) inside of your arteries (atherosclerosis). Small amounts of plaque can break off from the walls of the blood vessels and become lodged in a smaller artery. This blocks blood flow and can cause acute ischemic limb. Other common causes of PVD include:  Blood clots that form inside of blood vessels.  Injuries to blood vessels.  Diseases that cause inflammation of blood vessels or cause blood vessel spasms.  Health behaviors and health history that increase your risk of developing PVD. RISK FACTORS  You may have a greater risk of PVD if you:  Have a family history of PVD.  Have certain medical conditions, including:  High cholesterol.  Diabetes.  High blood pressure (hypertension).  Coronary heart disease.  Past problems with blood clots.  Past injury, such as burns or a broken bone. These may have damaged blood vessels in your limbs.  Buerger disease. This is caused by inflamed blood  vessels in your hands and feet.  Some forms of arthritis.  Rare birth defects that affect the arteries in your legs.  Use tobacco.  Do not get enough exercise.  Are obese.  Are age 50 or older. SIGNS AND SYMPTOMS  PVD may cause many different symptoms. Your symptoms depend on what part of your body is not getting enough blood. Some common signs and symptoms include:  Cramps in your lower legs. This may be a symptom of poor leg circulation (claudication).  Pain and weakness in your legs while you are physically active that goes away when you rest (intermittent claudication).  Leg pain when at rest.  Leg numbness, tingling, or weakness.  Coldness in a leg or foot, especially when compared with the other leg.  Skin or hair changes. These can include:  Hair loss.  Shiny skin.  Pale or bluish skin.  Thick toenails.  Inability to get or maintain an erection (erectile dysfunction). People with PVD are more prone to developing ulcers and sores on their toes, feet, or legs. These may take longer than normal to heal. DIAGNOSIS Your health care provider may diagnose PVD from your signs and symptoms. The health care provider will also do a physical exam. You may have tests to find out what is causing your PVD and determine its severity. Tests may include:  Blood pressure recordings from your arms and legs and measurements of the strength of your pulses (pulse volume recordings).  Imaging studies using sound waves to take pictures of   the blood flow through your blood vessels (Doppler ultrasound).  Injecting a dye into your blood vessels before having imaging studies using:  X-rays (angiogram or arteriogram).  Computer-generated X-rays (CT angiogram).  A powerful electromagnetic field and a computer (magnetic resonance angiogram or MRA). TREATMENT Treatment for PVD depends on the cause of your condition and the severity of your symptoms. It also depends on your age. Underlying  causes need to be treated and controlled. These include long-lasting (chronic) conditions, such as diabetes, high cholesterol, and high blood pressure. You may need to first try making lifestyle changes and taking medicines. Surgery may be needed if these do not work. Lifestyle changes may include:  Quitting smoking.  Exercising regularly.  Following a low-fat, low-cholesterol diet. Medicines may include:  Blood thinners to prevent blood clots.  Medicines to improve blood flow.  Medicines to improve your blood cholesterol levels. Surgical procedures may include:  A procedure that uses an inflated balloon to open a blocked artery and improve blood flow (angioplasty).  A procedure to put in a tube (stent) to keep a blocked artery open (stent implant).  Surgery to reroute blood flow around a blocked artery (peripheral bypass surgery).  Surgery to remove dead tissue from an infected wound on the affected limb.  Amputation. This is surgical removal of the affected limb. This may be necessary in cases of acute ischemic limb that are not improved through medical or surgical treatments. HOME CARE INSTRUCTIONS  Take medicines only as directed by your health care provider.  Do not use any tobacco products, including cigarettes, chewing tobacco, or electronic cigarettes. If you need help quitting, ask your health care provider.  Lose weight if you are overweight, and maintain a healthy weight as directed by your health care provider.  Eat a diet that is low in fat and cholesterol. If you need help, ask your health care provider.  Exercise regularly. Ask your health care provider to suggest some good activities for you.  Use compression stockings or other mechanical devices as directed by your health care provider.  Take good care of your feet.  Wear comfortable shoes that fit well.  Check your feet often for any cuts or sores. SEEK MEDICAL CARE IF:  You have cramps in your legs  while walking.  You have leg pain when you are at rest.  You have coldness in a leg or foot.  Your skin changes.  You have erectile dysfunction.  You have cuts or sores on your feet that are not healing. SEEK IMMEDIATE MEDICAL CARE IF:  Your arm or leg turns cold and blue.  Your arms or legs become red, warm, swollen, painful, or numb.  You have chest pain or trouble breathing.  You suddenly have weakness in your face, arm, or leg.  You become very confused or lose the ability to speak.  You suddenly have a very bad headache or lose your vision.   This information is not intended to replace advice given to you by your health care provider. Make sure you discuss any questions you have with your health care provider.   Document Released: 04/27/2004 Document Revised: 04/10/2014 Document Reviewed: 08/28/2013 Elsevier Interactive Patient Education 2016 Else[employer] Inc[employer]    Before your next abdominal ultrasound:  Take two Extra-Strength Gas-X capsules at bedtime the night before the test. Take another two Extra-Strength Gas-X capsules 3 hours before the test.   

## 2015-11-02 ENCOUNTER — Other Ambulatory Visit: Payer: Self-pay | Admitting: Orthopedic Surgery

## 2015-11-12 ENCOUNTER — Other Ambulatory Visit: Payer: Self-pay | Admitting: *Deleted

## 2015-11-12 DIAGNOSIS — I739 Peripheral vascular disease, unspecified: Secondary | ICD-10-CM

## 2015-11-12 MED ORDER — CLOPIDOGREL BISULFATE 75 MG PO TABS: 75.0000 mg | ORAL_TABLET | Freq: Every day | ORAL | 3 refills | Status: DC

## 2016-02-15 NOTE — Addendum Note (Signed)
Addended by: Sharee Pimple on: 02/15/2016 03:07 PM   Modules accepted: Orders

## 2016-03-17 ENCOUNTER — Encounter: Payer: Self-pay | Admitting: Sports Medicine

## 2016-03-17 ENCOUNTER — Ambulatory Visit: Payer: Medicare Other | Admitting: Sports Medicine

## 2016-03-17 DIAGNOSIS — E1140 Type 2 diabetes mellitus with diabetic neuropathy, unspecified: Secondary | ICD-10-CM

## 2016-03-17 DIAGNOSIS — I739 Peripheral vascular disease, unspecified: Secondary | ICD-10-CM

## 2016-03-17 DIAGNOSIS — B351 Tinea unguium: Secondary | ICD-10-CM | POA: Diagnosis not present

## 2016-03-17 DIAGNOSIS — Q828 Other specified congenital malformations of skin: Secondary | ICD-10-CM

## 2016-03-17 NOTE — Progress Notes (Signed)
Subjective: Shari [name] is a 81 y.o. female patient with history of diabetes who presents to office today complaining of dry callus skin on the right foot and long, painful nails  while ambulating in shoes; unable to trim. Patient states that the glucose reading this morning was 190 mg/dl. Patient denies any new changes in medication or new problems. Patient denies any new cramping, numbness, burning or tingling in the legs.  Patient states that she owns on Plavix for arterial disease, has stents placed.  Patient Active Problem List   Diagnosis Date Noted  . PAD (peripheral artery disease) (HCC) 11/03/2014  . High cholesterol 11/10/2010  . Hypertension 11/10/2010  . ibs 11/10/2010  . Osteoarthritis 11/10/2010  . Diverticulitis 11/10/2010  . GERD (gastroesophageal reflux disease) 11/10/2010  . Glaucoma 11/10/2010  . Diabetes mellitus 11/10/2010  . Onychomycosis 11/10/2010   Current Outpatient Prescriptions on File Prior to Visit  Medication Sig Dispense Refill  . Acetaminophen (TYLENOL ARTHRITIS PAIN PO) Take by mouth. Reported on 07/07/2015    . aspirin 81 MG tablet Take by mouth daily. Reported on 10/20/2015    . atorvastatin (LIPITOR) 10 MG tablet Take 10 mg by mouth at bedtime.  11  . Calcium Carbonate-Vit D-Min (CALCIUM 1200 PO) Take 1 tablet by mouth daily.     . carvedilol (COREG) 25 MG tablet Take 25 mg by mouth 2 (two) times daily with a meal.      . clopidogrel (PLAVIX) 75 MG tablet Take 1 tablet (75 mg total) by mouth daily. 90 tablet 3  . DEXILANT 60 MG capsule Take 60 mg by mouth daily.     Marland Kitchen dicyclomine (BENTYL) 20 MG tablet Take 20 mg by mouth every 6 (six) hours as needed for spasms.     . furosemide (LASIX) 40 MG tablet Take 40 mg by mouth at bedtime.     Marland Kitchen glipiZIDE (GLUCOTROL) 10 MG tablet Take 20 mg by mouth 2 (two) times daily before a meal.     . isosorbide mononitrate (IMDUR) 60 MG 24 hr tablet Take 60 mg by mouth daily.     . Multiple Vitamin (MULTIVITAMIN PO)  Take 1 tablet by mouth daily.     Marland Kitchen NITROSTAT 0.4 MG SL tablet Place 0.4 mg under the tongue every 5 (five) minutes as needed.     . potassium chloride (K-DUR) 10 MEQ tablet Take 10 mEq by mouth 2 (two) times daily.     . predniSONE (DELTASONE) 5 MG tablet Take 5 mg by mouth daily with breakfast. Reported on 10/20/2015    . ramipril (ALTACE) 10 MG capsule Take 10 mg by mouth daily.      Marland Kitchen RANEXA 500 MG 12 hr tablet Take 500 mg by mouth every 12 (twelve) hours.  2  . ranitidine (ZANTAC) 300 MG tablet Take 300 mg by mouth at bedtime.     . Vitamin D, Ergocalciferol, (DRISDOL) 50000 UNITS CAPS capsule Take 50,000 Units by mouth every 7 (seven) days. Sunday     No current facility-administered medications on file prior to visit.    Allergies  Allergen Reactions  . Codeine Diarrhea, Nausea And Vomiting and Other (See Comments)    All-over body aches, too  . Ivp Dye [Iodinated Diagnostic Agents] Nausea And Vomiting and Other (See Comments)    No results found for this or any previous visit (from the past 2160 hour(s)).  Objective: General: Patient is awake, alert, and oriented x 3 and in no acute distress.  Integument:  Skin is warm, dry and supple bilateral. Nails are tender, long, thickened and dystrophic with subungual debris, consistent with onychomycosis, 2-5 bilateral. There are no nails on bilateral hallux, or very limited nail due to previous nail procedure bilateral There is mild callus at the right hallux and medial first tarsophalangeal joint with surrounding dry skin with no signs of infection. No open lesions or preulcerative lesions present bilateral. Remaining integument unremarkable.  Vasculature:  Dorsalis Pedis pulse 1/4 bilateral. Posterior Tibial pulse 1/4 bilateral. Capillary fill time <5 sec 1-5 bilateral. Scant hair growth to the level of the digits.Temperature gradient within normal limits. Mild varicosities present bilateral. No edema present bilateral.   Neurology: The  patient has slightly diminished sensation measured with a 5.07/10g Semmes Weinstein Monofilament at all pedal sites bilateral . Vibratory sensation diminished bilateral with tuning fork. No Babinski sign present bilateral.   Musculoskeletal: Asymptomatic mild hammertoes  pedal deformities noted bilateral. Muscular strength 5/5 in all lower extremity muscular groups bilateral without pain on range of motion . No tenderness with calf compression bilateral.  Assessment and Plan: Problem List Items Addressed This Visit    None    Visit Diagnoses    Dermatophytosis of nail    -  Primary   Porokeratosis       Right medial hallux and first MPJ   Type 2 diabetes, controlled, with neuropathy (HCC)       PVD (peripheral vascular disease) (HCC)         -Examined patient. -Discussed and educated patient on diabetic foot care, especially with  regards to the vascular, neurological and musculoskeletal systems.  -Stressed the importance of good glycemic control and the detriment of not controlling glucose levels in relation to the foot. -Mechanically debrided all nails bilateral using sterile nail nipper and filed with dremel without incident And smooth callus area on right foot using rotary bur without incident.  -Recommend daily skin emollients for dry skin and to decrease recurrence of callus -Answered all patient questions -Patient to return  in 3 months for at risk foot care -Patient advised to call the office if any problems or questions arise in the meantime.  Asencion Islam, DPM

## 2016-05-05 ENCOUNTER — Encounter: Payer: Self-pay | Admitting: Family

## 2016-05-10 ENCOUNTER — Ambulatory Visit (INDEPENDENT_AMBULATORY_CARE_PROVIDER_SITE_OTHER)
Admission: RE | Admit: 2016-05-10 | Discharge: 2016-05-10 | Disposition: A | Discharge status: Home / Self Care | Payer: Medicare Other | Source: Ambulatory Visit | Attending: Family | Admitting: Family

## 2016-05-10 ENCOUNTER — Encounter: Payer: Self-pay | Admitting: Family

## 2016-05-10 ENCOUNTER — Ambulatory Visit (INDEPENDENT_AMBULATORY_CARE_PROVIDER_SITE_OTHER): Payer: Medicare Other | Admitting: Family

## 2016-05-10 ENCOUNTER — Ambulatory Visit (HOSPITAL_COMMUNITY)
Admission: RE | Admit: 2016-05-10 | Discharge: 2016-05-10 | Disposition: A | Discharge status: Home / Self Care | Payer: Medicare Other | Source: Ambulatory Visit | Attending: Family | Admitting: Family

## 2016-05-10 VITALS — BP 87/52 | HR 62 | Temp 96.8°F | Resp 14 | Ht 62.0 in | Wt 120.0 lb

## 2016-05-10 DIAGNOSIS — I7409 Other arterial embolism and thrombosis of abdominal aorta: Secondary | ICD-10-CM

## 2016-05-10 DIAGNOSIS — E1151 Type 2 diabetes mellitus with diabetic peripheral angiopathy without gangrene: Secondary | ICD-10-CM

## 2016-05-10 DIAGNOSIS — Z959 Presence of cardiac and vascular implant and graft, unspecified: Secondary | ICD-10-CM

## 2016-05-10 DIAGNOSIS — Z9862 Peripheral vascular angioplasty status: Secondary | ICD-10-CM

## 2016-05-10 DIAGNOSIS — Z48812 Encounter for surgical aftercare following surgery on the circulatory system: Secondary | ICD-10-CM

## 2016-05-10 DIAGNOSIS — I739 Peripheral vascular disease, unspecified: Secondary | ICD-10-CM

## 2016-05-10 NOTE — Progress Notes (Signed)
[facility] [place]   CC: Follow up peripheral artery occlusive disease  History of Present Illness MARILEE DITOMMASO is a 82 y.o. female patient of Dr. Hart Rochester who is status post insertion of aortic stent and left common iliac stent by Dr. Imogene Burn in August of 2016. This was done for life limiting claudication of both legs.   Dr. Hart Rochester had seen the patient with bilateral lower extremity claudication symptoms and suspected aortic and iliac disease. She states that since the procedure she is completely free of claudication symptoms in both legs. There had been some question about whether she had neurogenic claudication and that has now been ruled out. The patient returns for routine surveillance of PAD.  She had right hand surgery in August 2017 for carpal tunnel syndrome.  Her walking does not seem to be limited by her arthritis pain, she plans to walk more when the weather gets warmer.  She is seeing a rheumatologist.  She sees Dr. Dulce Sellar in [place] for CHF, has a Facilities manager.   She had a TIA in 2013. She states that Dr. Dulce Sellar requested a carotid duplex, performed at [facility], that showed no blockages (note that no carotid duplex results on file in [facility]).   Pt states she has problems with recurrent constipation and gas.  She denies feeling light headed, is hypotensive today.  Pt Diabetic: Yes, pt states last A1C was 7.8, increased from 6.9, is working closely with her PCP Pt smoker: non-smoker  Pt meds include:  Statin :Yes Betablocker: Yes ASA: No Other anticoagulants/antiplatelets: Plavix     Past Medical History:  Diagnosis Date  . Anxiety   . Diabetes mellitus 2 years  . Diverticulitis   . GERD (gastroesophageal reflux disease)   . Glaucoma   . High cholesterol   . Hypertension   . IBS (irritable bowel syndrome)   . Osteoarthritis   . Stroke [facility])     Social History Social History  Substance Use  Topics  . Smoking status: Never Smoker  . Smokeless tobacco: Never Used  . Alcohol use No    Family History No family history on file.  Past Surgical History:  Procedure Laterality Date  . FOOT SURGERY    . pacemaker surgery     a-fib and was done july 2014  . PERIPHERAL VASCULAR CATHETERIZATION N/A 11/05/2014   Procedure: Abdominal Aortogram;  Surgeon: Fransisco Hertz, MD;  Location: [facility] INVASIVE CV LAB;  Service: Cardiovascular;  Laterality: N/A;    Allergies  Allergen Reactions  . Codeine Diarrhea, Nausea And Vomiting and Other (See Comments)    All-over body aches, too  . Ivp Dye [Iodinated Diagnostic Agents] Nausea And Vomiting and Other (See Comments)  . Metrizamide Nausea And Vomiting and Other (See Comments)    Current Outpatient Prescriptions  Medication Sig Dispense Refill  . Acetaminophen (TYLENOL ARTHRITIS PAIN PO) Take by mouth. Reported on 07/07/2015    . aspirin 81 MG tablet Take by mouth daily. Reported on 10/20/2015    . atorvastatin (LIPITOR) 10 MG tablet Take 10 mg by mouth at bedtime.  11  . Calcium Carbonate-Vit D-Min (CALCIUM 1200 PO) Take 1 tablet by mouth daily.     . carvedilol (COREG) 25 MG tablet Take 25 mg by mouth 2 (two) times daily with a meal.      . clopidogrel (PLAVIX) 75 MG tablet Take 1 tablet (75 mg total) by mouth daily. 90 tablet 3  . DEXILANT 60 MG capsule  Take 60 mg by mouth daily.     Marland Kitchen dicyclomine (BENTYL) 20 MG tablet Take 20 mg by mouth every 6 (six) hours as needed for spasms.     . furosemide (LASIX) 40 MG tablet Take 40 mg by mouth at bedtime.     Marland Kitchen glipiZIDE (GLUCOTROL) 10 MG tablet Take 20 mg by mouth 2 (two) times daily before a meal.     . isosorbide mononitrate (IMDUR) 60 MG 24 hr tablet Take 60 mg by mouth daily.     . Multiple Vitamin (MULTIVITAMIN PO) Take 1 tablet by mouth daily.     Marland Kitchen NITROSTAT 0.4 MG SL tablet Place 0.4 mg under the tongue every 5 (five) minutes as needed.     . potassium chloride (K-DUR) 10 MEQ tablet Take 10  mEq by mouth 2 (two) times daily.     . predniSONE (DELTASONE) 5 MG tablet Take 5 mg by mouth daily with breakfast. Reported on 10/20/2015    . ramipril (ALTACE) 10 MG capsule Take 10 mg by mouth daily.      Marland Kitchen RANEXA 500 MG 12 hr tablet Take 500 mg by mouth every 12 (twelve) hours.  2  . ranitidine (ZANTAC) 300 MG tablet Take 300 mg by mouth at bedtime.     . Vitamin D, Ergocalciferol, (DRISDOL) 50000 UNITS CAPS capsule Take 50,000 Units by mouth every 7 (seven) days. Sunday     No current facility-administered medications for this visit.     ROS: See HPI for pertinent positives and negatives.   Physical Examination  Vitals:   05/10/16 0954 05/10/16 0957  BP: (!) 88/54 (!) 87/52  Pulse: 62 62  Resp: 14   Temp: (!) 96.8 F (36 C)   TempSrc: Oral   SpO2: 97%   Weight: 120 lb (54.4 kg)   Height: 5\' 2"  (1.575 m)    Body mass index is 21.95 kg/m.  General: A&O x 3, WDWN. Gait: normal Eyes: PERRLA. Pulmonary: Respirations are non labored, CTAB, good air movement in all fields.  Cardiac: regular rhythm, no detected murmur.Pacemaker/defrillator subcutaneous left upper chest.    Carotid Bruits Right Left   Negative Negative  Aorta is not palpable. Radial pulses: 3+ palpable and =   VASCULAR EXAM: Extremities no ischemic changes, no Gangrene; no open wounds.     LE Pulses Right Left   FEMORAL 2+ palpable 2+ palpable    POPLITEAL not palpable  not palpable   POSTERIOR TIBIAL 2+ palpable  1+ palpable    DORSALIS PEDIS  ANTERIOR TIBIAL 2+palpable  1+ palpable    Abdomen: soft, NT, no palpable masses. Skin: no rashes, no ulcers. Musculoskeletal: no muscle wasting or atrophy.Arthritic changes in hands and feet. Neurologic: A&O X  3; Appropriate Affect, MOTOR FUNCTION: moving all extremities equally, motor strength 4/5 throughout. Speech is fluent/normal.  CN 2-12 intact except for considerable hearing loss.    ASSESSMENT: JANNE FAULK is a 82 y.o. female who is status post insertion of aortic stent and left common iliac stent by Dr. 76 in August of 2016.  Dr. [id number] had seen the patient with bilateral lower extremity claudication symptoms and suspected aortic and iliac disease. She states that since the procedure she is completely free of claudication symptoms in both legs. There had been some question about whether she had neurogenic claudication and that has now been ruled out. She has no signs of ischemia in either lower extremity.  Fortunately she has never used tobacco. Her primary atherosclerotic  risk factor is her uncontrolled DM. I advised her to work closely with her PCP to get this under as good control as possible.    DATA Today's left aortoiliac duplex had suboptimal visualization of the aorta and aortic stent due to bowl gas and stent shadowing. Patent left common iliac artery stent with no focal stenosis visualized. ABI's and TBI's remain normal in both lower extremities with all triphasic waveforms.    PLAN:  Graduated walking program discussed and how to achieve safely.  Based on the patient's vascular studies and examination, pt will return to clinic in 1 year with ABI's and left aortoiliac duplex.  I discussed in depth with the patient the nature of atherosclerosis, and emphasized the importance of maximal medical management including strict control of blood pressure, blood glucose, and lipid levels, obtaining regular exercise, and continued cessation of smoking.  The patient is aware that without maximal medical management the underlying atherosclerotic disease process will progress, limiting the benefit of any interventions.  The patient was given information about PAD including  signs, symptoms, treatment, what symptoms should prompt the patient to seek immediate medical care, and risk reduction measures to take.  Charisse March, RN, MSN, FNP-C [facility] [place] Phone: [phone]  Clinic MD: Normajean Baxter  05/10/16 10:11 AM

## 2016-05-10 NOTE — Patient Instructions (Signed)

## 2016-05-19 NOTE — Addendum Note (Signed)
Addended by: Burton Apley A on: 05/19/2016 11:54 AM   Modules accepted: Orders

## 2016-06-04 DIAGNOSIS — R7989 Other specified abnormal findings of blood chemistry: Secondary | ICD-10-CM

## 2016-06-04 DIAGNOSIS — R072 Precordial pain: Secondary | ICD-10-CM

## 2016-06-04 DIAGNOSIS — R778 Other specified abnormalities of plasma proteins: Secondary | ICD-10-CM

## 2016-06-04 DIAGNOSIS — E876 Hypokalemia: Secondary | ICD-10-CM

## 2016-06-04 DIAGNOSIS — I10 Essential (primary) hypertension: Secondary | ICD-10-CM | POA: Insufficient documentation

## 2016-06-04 HISTORY — DX: Essential (primary) hypertension: I10

## 2016-06-04 HISTORY — DX: Hypokalemia: E87.6

## 2016-06-04 HISTORY — DX: Other specified abnormal findings of blood chemistry: R79.89

## 2016-06-04 HISTORY — DX: Other specified abnormalities of plasma proteins: R77.8

## 2016-06-04 HISTORY — DX: Precordial pain: R07.2

## 2016-06-16 ENCOUNTER — Ambulatory Visit: Payer: Medicare Other | Admitting: Sports Medicine

## 2016-06-20 DIAGNOSIS — E559 Vitamin D deficiency, unspecified: Secondary | ICD-10-CM

## 2016-06-20 HISTORY — DX: Vitamin D deficiency, unspecified: E55.9

## 2016-07-05 ENCOUNTER — Encounter: Payer: Self-pay | Admitting: Sports Medicine

## 2016-07-05 ENCOUNTER — Ambulatory Visit (INDEPENDENT_AMBULATORY_CARE_PROVIDER_SITE_OTHER): Payer: Medicare Other | Admitting: Sports Medicine

## 2016-07-05 DIAGNOSIS — I739 Peripheral vascular disease, unspecified: Secondary | ICD-10-CM

## 2016-07-05 DIAGNOSIS — E1140 Type 2 diabetes mellitus with diabetic neuropathy, unspecified: Secondary | ICD-10-CM

## 2016-07-05 DIAGNOSIS — B351 Tinea unguium: Secondary | ICD-10-CM | POA: Diagnosis not present

## 2016-07-05 DIAGNOSIS — Q828 Other specified congenital malformations of skin: Secondary | ICD-10-CM | POA: Diagnosis not present

## 2016-07-05 NOTE — Progress Notes (Signed)
Subjective: Shari [name] is a 82 y.o. female patient with history of diabetes who returns to office today complaining of dry callus skin on the right foot and long, painful nails  while ambulating in shoes; unable to trim. Patient states that the glucose reading this morning good just had annual checkup and physical at her family doctor. Patient denies any new changes in medication or new problems. Patient denies any new cramping, numbness, burning or tingling in the legs.   On Plavix for arterial disease, has stents placed.   Patient Active Problem List   Diagnosis Date Noted  . PAD (peripheral artery disease) (HCC) 11/03/2014  . High cholesterol 11/10/2010  . Hypertension 11/10/2010  . ibs 11/10/2010  . Osteoarthritis 11/10/2010  . Diverticulitis 11/10/2010  . GERD (gastroesophageal reflux disease) 11/10/2010  . Glaucoma 11/10/2010  . Diabetes mellitus 11/10/2010  . Onychomycosis 11/10/2010   Current Outpatient Prescriptions on File Prior to Visit  Medication Sig Dispense Refill  . Acetaminophen (TYLENOL ARTHRITIS PAIN PO) Take by mouth. Reported on 07/07/2015    . aspirin 81 MG tablet Take by mouth daily. Reported on 10/20/2015    . atorvastatin (LIPITOR) 10 MG tablet Take 10 mg by mouth at bedtime.  11  . Calcium Carbonate-Vit D-Min (CALCIUM 1200 PO) Take 1 tablet by mouth daily.     . carvedilol (COREG) 25 MG tablet Take 25 mg by mouth 2 (two) times daily with a meal.      . clopidogrel (PLAVIX) 75 MG tablet Take 1 tablet (75 mg total) by mouth daily. 90 tablet 3  . DEXILANT 60 MG capsule Take 60 mg by mouth daily.     Marland Kitchen dicyclomine (BENTYL) 20 MG tablet Take 20 mg by mouth every 6 (six) hours as needed for spasms.     . furosemide (LASIX) 40 MG tablet Take 40 mg by mouth at bedtime.     Marland Kitchen glipiZIDE (GLUCOTROL) 10 MG tablet Take 20 mg by mouth 2 (two) times daily before a meal.     . isosorbide mononitrate (IMDUR) 60 MG 24 hr tablet Take 60 mg by mouth daily.     . Multiple Vitamin  (MULTIVITAMIN PO) Take 1 tablet by mouth daily.     Marland Kitchen NITROSTAT 0.4 MG SL tablet Place 0.4 mg under the tongue every 5 (five) minutes as needed.     . potassium chloride (K-DUR) 10 MEQ tablet Take 10 mEq by mouth 2 (two) times daily.     . predniSONE (DELTASONE) 5 MG tablet Take 5 mg by mouth daily with breakfast. Reported on 10/20/2015    . ramipril (ALTACE) 10 MG capsule Take 10 mg by mouth daily.      Marland Kitchen RANEXA 500 MG 12 hr tablet Take 500 mg by mouth every 12 (twelve) hours.  2  . ranitidine (ZANTAC) 300 MG tablet Take 300 mg by mouth at bedtime.     . Vitamin D, Ergocalciferol, (DRISDOL) 50000 UNITS CAPS capsule Take 50,000 Units by mouth every 7 (seven) days. Sunday     No current facility-administered medications on file prior to visit.    Allergies  Allergen Reactions  . Codeine Diarrhea, Nausea And Vomiting and Other (See Comments)    All-over body aches, too  . Ivp Dye [Iodinated Diagnostic Agents] Nausea And Vomiting and Other (See Comments)  . Metrizamide Nausea And Vomiting and Other (See Comments)    No results found for this or any previous visit (from the past 2160 hour(s)).  Objective: General:  Patient is awake, alert, and oriented x 3 and in no acute distress.  Integument: Skin is warm, dry and supple bilateral. Nails are tender, long, thickened and dystrophic with subungual debris, consistent with onychomycosis, 2-5 bilateral. There are no nails on bilateral hallux, or very limited nail due to previous nail procedure bilateral There is mild callus at the right hallux and medial first metatarsophalangeal joint with surrounding dry skin with no signs of infection. No open lesions or preulcerative lesions present bilateral. Remaining integument unremarkable.  Vasculature:  Dorsalis Pedis pulse 1/4 bilateral. Posterior Tibial pulse 1/4 bilateral. Capillary fill time <5 sec 1-5 bilateral. Scant hair growth to the level of the digits.Temperature gradient within normal limits.  Mild varicosities present bilateral. No edema present bilateral.   Neurology: The patient has slightly diminished sensation measured with a 5.07/10g Semmes Weinstein Monofilament at all pedal sites bilateral . Vibratory sensation diminished bilateral with tuning fork. No Babinski sign present bilateral.   Musculoskeletal: Asymptomatic mild hammertoes  pedal deformities noted bilateral. Muscular strength 5/5 in all lower extremity muscular groups bilateral without pain on range of motion . No tenderness with calf compression bilateral.  Assessment and Plan: Problem List Items Addressed This Visit    None    Visit Diagnoses    Dermatophytosis of nail    -  Primary   Porokeratosis       Type 2 diabetes, controlled, with neuropathy (HCC)       PVD (peripheral vascular disease) (HCC)         -Examined patient. -Discussed and educated patient on diabetic foot care, especially with  regards to the vascular, neurological and musculoskeletal systems.  -Stressed the importance of good glycemic control and the detriment of not controlling glucose levels in relation to the foot. -Mechanically debrided all nails bilateral using sterile nail nipper and filed with dremel without incident And smooth callus2 on right foot using rotary bur without incident.  -Recommend daily skin emollients for dry skin and to decrease recurrence of callus -Answered all patient questions -Patient to return  in 3 months for at risk foot care -Patient advised to call the office if any problems or questions arise in the meantime.  Asencion Islam, DPM

## 2016-08-07 MED ORDER — CLOPIDOGREL BISULFATE 75 MG PO TABS: 75.0000 mg | ORAL_TABLET | Freq: Every day | ORAL | 3 refills | Status: DC

## 2016-08-07 NOTE — Addendum Note (Signed)
Addended by: Yolonda Kida on: 08/07/2016 10:59 AM   Modules accepted: Orders

## 2016-10-06 ENCOUNTER — Ambulatory Visit: Payer: Medicare Other | Admitting: Sports Medicine

## 2016-10-12 ENCOUNTER — Encounter: Payer: Self-pay | Admitting: Cardiology

## 2016-10-16 ENCOUNTER — Encounter: Payer: Self-pay | Admitting: Cardiology

## 2016-10-16 ENCOUNTER — Ambulatory Visit (INDEPENDENT_AMBULATORY_CARE_PROVIDER_SITE_OTHER): Payer: Medicare Other | Admitting: Cardiology

## 2016-10-16 VITALS — BP 112/56 | HR 93 | Ht 62.0 in | Wt 120.4 lb

## 2016-10-16 DIAGNOSIS — I2510 Atherosclerotic heart disease of native coronary artery without angina pectoris: Secondary | ICD-10-CM

## 2016-10-16 DIAGNOSIS — I5042 Chronic combined systolic (congestive) and diastolic (congestive) heart failure: Secondary | ICD-10-CM | POA: Diagnosis not present

## 2016-10-16 DIAGNOSIS — Z95810 Presence of automatic (implantable) cardiac defibrillator: Secondary | ICD-10-CM

## 2016-10-16 DIAGNOSIS — I110 Hypertensive heart disease with heart failure: Secondary | ICD-10-CM

## 2016-10-16 DIAGNOSIS — E7800 Pure hypercholesterolemia, unspecified: Secondary | ICD-10-CM | POA: Diagnosis not present

## 2016-10-16 NOTE — Patient Instructions (Addendum)
Medication Instructions:  Your physician recommends that you continue on your current medications as directed. Please refer to the Current Medication list given to you today.   Labwork: None  Testing/Procedures: None  Follow-Up: Your physician wants you to follow-up in: 6 months. You will receive a reminder letter in the mail two months in advance. If you don't receive a letter, please call our office to schedule the follow-up appointment.  You have an appointment scheduled with Dr. Elberta Fortis 10/23/16.  [address], [place] [address]   Any Other Special Instructions Will Be Listed Below (If Applicable).     If you need a refill on your cardiac medications before your next appointment, please call your pharmacy.    KNOW YOUR HEART FAILURE ZONES  Green Zone (Your Goal):  Marland Kitchen No shortness of breath or trouble bleeding . No weight gain of more than 3 pounds in one day or 5 pounds in a week . No swelling in your ankles, feet, stomach, or hands . No chest discomfort, heaviness or pain  Yellow Zone (Call the Doctor to get help!): Having 1 or more of the following: . Weight gain of 3 pounds in 1 day or 5 pounds in 1 week . More swelling of your feet, ankles, stomach, or hands . It is harder to breath when lying down. You need to sit up . Chest discomfort, heaviness, or pain . You feel more tired or have less energy than normal . New or worsening dizziness . Dry hacking cough . You feel uneasy and you know something is not right  Red Zone (Call 911-EMERGENCY): . Struggling to breathe. This does not go away when you sit up. . Stronger and more regular amounts of chest discomfort . New confusion or can't think clearly . Fainting or near fainting   Resources form the American heart Association: [id number]

## 2016-10-16 NOTE — Progress Notes (Signed)
Cardiology Office Note:    Date:  10/16/2016   ID:  Shari [name], DOB Oct 05, 1934, MRN [id number]  PCP:  Irena Reichmann, DO please do a CMP and lipid profile tomorrow at your office Cardiologist:  Norman Herrlich, MD    Referring MD: Irena Reichmann, DO    ASSESSMENT:    1. Hypertensive heart failure (HCC)   2. Mild CAD   3. Chronic combined systolic and diastolic heart failure (HCC)   4. Biventricular ICD (implantable cardioverter-defibrillator) in place   5. High cholesterol    PLAN:    In order of problems listed above:  1. Stable blood pressure at target continue current guideline direct medical therapy including loop diuretic ACE inhibitor and beta blocker 2. Stable continue current treatment including clopidogrel beta blocker and statin. Recent myocardial perfusion study was low risk and reassuring. 3. Stable she has requested referral to EP within [facility] for device management. 4. Stable continue her statin, she is due for labs tomorrow and I will request a lipid profile be performed.  Medication management:  Next appointment: 6 months   Medication Adjustments/Labs and Tests Ordered: Current medicines are reviewed at length with the patient today.  Concerns regarding medicines are outlined above.  No orders of the defined types were placed in this encounter.  No orders of the defined types were placed in this encounter.   Chief Complaint  Patient presents with  . Follow-up    Routine flup appt for heart failure    History of Present Illness:    Shari [name] is a 82 y.o. female with a hx of LBBB, Dyslipidemia, HTN , and CRT-D  last seen 6 months ago.She had a visit to [facility] with chest pain, noncardiac, normal MPSWith normal left ventricular ejection fraction. Despite her dramatic improvement with treatment for systolic left ventricular dysfunction she notices that she still fatigues easily but has no edema orthopnea shortness of breath  palpitation syncope TIA. Prior to that visit I reviewed the records from [facility]. Compliance with diet, lifestyle and medications: Yes Past Medical History:  Diagnosis Date  . Anxiety   . Automatic implantable cardioverter-defibrillator in situ 10/25/2014   Overview:  Overview:  dual chamber ICD,upgraded to CRT with LBBB and nonischemic cardiomyopathy  . Biventricular ICD (implantable cardioverter-defibrillator) in place 10/25/2014   Overview:  BIV -ICD,upgraded to CRT with LBBB and nonischemic cardiomyopathy  . Chronic coronary artery disease 10/25/2014  . Costochondritis 10/25/2014  . Diabetes mellitus 2 years  . Diverticulitis   . Elevated troponin 06/04/2016   Last Assessment & Plan:  Likely may be related to ventricular pacing and underlying cardiomyopathy.  The trajectory of the biomarker elevation and neither is her clinical history suggestive of acute coronary syndrome.  Further stratification with nuclear stress testing  . GERD (gastroesophageal reflux disease)   . Glaucoma   . High cholesterol   . Hypertension   . Hypertensive heart disease with heart failure (HCC) 10/25/2014  . Hypokalemia 06/04/2016   Last Assessment & Plan:  Replace with potassium chloride.  . IBS (irritable bowel syndrome)   . 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 Mild CAD 10/25/2014  . Onychomycosis 11/10/2010  . Osteoarthritis   . PAD (peripheral artery disease) (HCC) 11/03/2014   Overview:  status post insertion of aortic stent and left common iliac stent by Dr. Imogene Burn in August of 2016  . Peripheral arterial occlusive disease (HCC) 11/03/2014  . Precordial chest pain  06/04/2016   Last Assessment & Plan:  My suspicion is that this is likely related to gastroesophageal reflux disease with acute indigestion last evening.  The biomarker elevation is not completely unexpected in this elderly patient with reported cardiomyopathy and current demand ventricular pacing.  Nonetheless  she does have a history of extensive vascular disease (though apparently no significant coronary atherosclerosis in 2013) so we will proceed with a noninvasive risk stratification with pharmacological nuclear stress testing.  Assuming this is a low risk or normal study then I would not advise any invasive cardiac evaluation at this time.  . Preoperative cardiovascular examination 04/02/2015  . Rheumatoid arthritis involving multiple sites (HCC) 05/21/2015  . Stroke (HCC)   . Vitamin D deficiency 06/20/2016    Past Surgical History:  Procedure Laterality Date  . FOOT SURGERY    . pacemaker surgery     a-fib and was done july 2014  . PERIPHERAL VASCULAR CATHETERIZATION N/A 11/05/2014   Procedure: Abdominal Aortogram;  Surgeon: Fransisco Hertz, MD;  Location: [facility] INVASIVE CV LAB;  Service: Cardiovascular;  Laterality: N/A;    Current Medications: Current Meds  Medication Sig  . Acetaminophen (TYLENOL ARTHRITIS PAIN PO) Take 2 tablets by mouth 2 (two) times daily. Reported on 07/07/2015  . ALPRAZolam (XANAX) 0.25 MG tablet Take 0.25 mg by mouth daily as needed for anxiety.  Marland Kitchen atorvastatin (LIPITOR) 10 MG tablet Take 10 mg by mouth at bedtime.  . Calcium Carbonate-Vit D-Min (CALCIUM 1200 PO) Take 1 tablet by mouth daily.   . carvedilol (COREG) 25 MG tablet Take 25 mg by mouth 2 (two) times daily with a meal.    . clopidogrel (PLAVIX) 75 MG tablet Take 1 tablet (75 mg total) by mouth daily.  Marland Kitchen DEXILANT 60 MG capsule Take 60 mg by mouth daily.   . furosemide (LASIX) 40 MG tablet Take 40 mg by mouth 2 (two) times daily.   Marland Kitchen glipiZIDE (GLUCOTROL) 10 MG tablet Take 20 mg by mouth 2 (two) times daily before a meal.   . isosorbide mononitrate (IMDUR) 60 MG 24 hr tablet Take 60 mg by mouth 2 (two) times daily.   . montelukast (SINGULAIR) 10 MG tablet Take 10 mg by mouth daily as needed for cough.  . Multiple Vitamin (MULTIVITAMIN PO) Take 1 tablet by mouth daily.   Marland Kitchen NITROSTAT 0.4 MG SL tablet Place 0.4 mg  under the tongue every 5 (five) minutes as needed.   . potassium chloride (K-DUR) 10 MEQ tablet Take 10 mEq by mouth 2 (two) times daily.   . promethazine (PHENERGAN) 25 MG tablet Take 1 tablet by mouth daily as needed for nausea.  . ramipril (ALTACE) 10 MG capsule Take 10 mg by mouth daily.    Marland Kitchen RANEXA 500 MG 12 hr tablet Take 500 mg by mouth every 12 (twelve) hours.  . ranitidine (ZANTAC) 300 MG tablet Take 300 mg by mouth at bedtime.   . Vitamin D, Ergocalciferol, (DRISDOL) 50000 UNITS CAPS capsule Take 50,000 Units by mouth every 7 (seven) days. Sunday  . Vitamin D, Ergocalciferol, (DRISDOL) 50000 units CAPS capsule Take 1 capsule by mouth once a week.     Allergies:   Codeine; Ivp dye [iodinated diagnostic agents]; and Metrizamide   Social History   Social History  . Marital status: Married    Spouse name: N/A  . Number of children: N/A  . Years of education: N/A   Social History Main Topics  . Smoking status: Never Smoker  .  Smokeless tobacco: Never Used  . Alcohol use No  . Drug use: No  . Sexual activity: Not on file   Other Topics Concern  . Not on file   Social History Narrative  . No narrative on file     Family History: The patient's family history includes Heart attack in her maternal grandfather and maternal grandmother. ROS:   Please see the history of present illness.    All other systems reviewed and are negative.  EKGs/Labs/Other Studies Reviewed:    The following studies were reviewed today:   Pacemaker check: REMOTE MONITORING ASSESSMENT Date of Transmission: September 07, 2016 Patient MR Number: [id number] Patient Name: Shari [name], [birth year], 82 y.o. Following Provider: Brooke Pace, MD Primary Care Provider: Vertell Novak, DO Manufacturer of Device: [employer] Type of Device: CRT-D / Bi-Ventricular Defibrillator See scanned/downloaded PDF report for model numbers, serial numbers, and date(s) of implant. Presenting  Rhythm: AS BVP ~72 Percentage RV Pacing: Not Applicable Percentage Biventricular Pacing: >99% Biventricular Paced Device Findings:  Please see downloaded PDF file of transmission under Media Tab for full details of device interrogation to include, when applicable, battery status/charge time, lead trend data, and programmed parameters  Normal Follow Up  Battery Status Adequate Battery Voltage  Lead Trends Lead Trends Stable  Anticoagulation History: Clopidogrel / Plavix     Recent Labs: No results found for requested labs within last 8760 hours.  Recent Lipid Panel No results found for: CHOL, TRIG, HDL, CHOLHDL, VLDL, LDLCALC, LDLDIRECT  Physical Exam:    VS:  BP (!) 112/56   Pulse 93   Ht 5\' 2"  (1.575 m)   Wt 120 lb 6.4 oz (54.6 kg)   SpO2 95%   BMI 22.02 kg/m     Wt Readings from Last 3 Encounters:  10/16/16 120 lb 6.4 oz (54.6 kg)  05/10/16 120 lb (54.4 kg)  10/20/15 121 lb (54.9 kg)     GEN:  Well nourished, well developed in no acute distress HEENT: Normal NECK: No JVD; No carotid bruits LYMPHATICS: No lymphadenopathy CARDIAC: RRR, no murmurs, rubs, gallops RESPIRATORY:  Clear to auscultation without rales, wheezing or rhonchi  ABDOMEN: Soft, non-tender, non-distended MUSCULOSKELETAL:  No edema; No deformity  SKIN: Warm and dry NEUROLOGIC:  Alert and oriented x 3 PSYCHIATRIC:  Normal affect    Signed, Norman Herrlich, MD  10/16/2016 2:08 PM    [facility]

## 2016-10-20 ENCOUNTER — Ambulatory Visit (INDEPENDENT_AMBULATORY_CARE_PROVIDER_SITE_OTHER): Payer: Medicare Other | Admitting: Sports Medicine

## 2016-10-20 DIAGNOSIS — I739 Peripheral vascular disease, unspecified: Secondary | ICD-10-CM

## 2016-10-20 DIAGNOSIS — E1140 Type 2 diabetes mellitus with diabetic neuropathy, unspecified: Secondary | ICD-10-CM

## 2016-10-20 DIAGNOSIS — B351 Tinea unguium: Secondary | ICD-10-CM

## 2016-10-20 DIAGNOSIS — Q828 Other specified congenital malformations of skin: Secondary | ICD-10-CM | POA: Diagnosis not present

## 2016-10-20 NOTE — Progress Notes (Signed)
Subjective: Shari [name] is a 82 y.o. female patient with history of diabetes who returns to office today complaining of dry callus skin on the right foot and long, painful nails  while ambulating in shoes; unable to trim. Patient states that the glucose reading this morning good. Patient denies any new changes in medication or new problems.    Patient Active Problem List   Diagnosis Date Noted  . Vitamin D deficiency 06/20/2016  . Elevated troponin 06/04/2016  . Hypokalemia 06/04/2016  . Precordial chest pain 06/04/2016  . 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 Rheumatoid arthritis involving multiple sites (HCC) 05/21/2015  . Preoperative cardiovascular examination 04/02/2015  . PAD (peripheral artery disease) (HCC) 11/03/2014  . Peripheral arterial occlusive disease (HCC) 11/03/2014  . Automatic implantable cardioverter-defibrillator in situ 10/25/2014  . 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 Chronic combined systolic and diastolic heart failure (HCC) 10/25/2014  . Costochondritis 10/25/2014  . Mild CAD 10/25/2014  . High cholesterol 11/10/2010  . Hypertensive heart failure (HCC) 11/10/2010  . ibs 11/10/2010  . Osteoarthritis 11/10/2010  . Diverticulitis 11/10/2010  . GERD (gastroesophageal reflux disease) 11/10/2010  . Glaucoma 11/10/2010  . Diabetes mellitus 11/10/2010  . Onychomycosis 11/10/2010   Current Outpatient Prescriptions on File Prior to Visit  Medication Sig Dispense Refill  . Acetaminophen (TYLENOL ARTHRITIS PAIN PO) Take 2 tablets by mouth 2 (two) times daily. Reported on 07/07/2015    . ALPRAZolam (XANAX) 0.25 MG tablet Take 0.25 mg by mouth daily as needed for anxiety.    Marland Kitchen atorvastatin (LIPITOR) 10 MG tablet Take 10 mg by mouth at bedtime.  11  . Calcium Carbonate-Vit D-Min (CALCIUM 1200 PO) Take 1 tablet by mouth daily.     . carvedilol (COREG) 25 MG tablet Take 25 mg by mouth 2 (two)  times daily with a meal.      . clopidogrel (PLAVIX) 75 MG tablet Take 1 tablet (75 mg total) by mouth daily. 90 tablet 3  . DEXILANT 60 MG capsule Take 60 mg by mouth daily.     . furosemide (LASIX) 40 MG tablet Take 40 mg by mouth 2 (two) times daily.     Marland Kitchen glipiZIDE (GLUCOTROL) 10 MG tablet Take 20 mg by mouth 2 (two) times daily before a meal.     . isosorbide mononitrate (IMDUR) 60 MG 24 hr tablet Take 60 mg by mouth 2 (two) times daily.     . montelukast (SINGULAIR) 10 MG tablet Take 10 mg by mouth daily as needed for cough.    . Multiple Vitamin (MULTIVITAMIN PO) Take 1 tablet by mouth daily.     Marland Kitchen NITROSTAT 0.4 MG SL tablet Place 0.4 mg under the tongue every 5 (five) minutes as needed.     . potassium chloride (K-DUR) 10 MEQ tablet Take 10 mEq by mouth 2 (two) times daily.     . promethazine (PHENERGAN) 25 MG tablet Take 1 tablet by mouth daily as needed for nausea.    . ramipril (ALTACE) 10 MG capsule Take 10 mg by mouth daily.      Marland Kitchen RANEXA 500 MG 12 hr tablet Take 500 mg by mouth every 12 (twelve) hours.  2  . ranitidine (ZANTAC) 300 MG tablet Take 300 mg by mouth at bedtime.     . Vitamin D, Ergocalciferol, (DRISDOL) 50000 UNITS CAPS capsule Take 50,000 Units by mouth every 7 (seven) days. Sunday    . Vitamin D, Ergocalciferol, (DRISDOL) 50000  units CAPS capsule Take 1 capsule by mouth once a week.     No current facility-administered medications on file prior to visit.    Allergies  Allergen Reactions  . Codeine Diarrhea, Nausea And Vomiting and Other (See Comments)    All-over body aches, too  . Ivp Dye [Iodinated Diagnostic Agents] Nausea And Vomiting and Other (See Comments)  . Metrizamide Nausea And Vomiting and Other (See Comments)    No results found for this or any previous visit (from the past 2160 hour(s)).  Objective: General: Patient is awake, alert, and oriented x 3 and in no acute distress.  Integument: Skin is warm, dry and supple bilateral. Nails are  tender, long, thickened and dystrophic with subungual debris, consistent with onychomycosis, 2-5 bilateral. There are no nails on bilateral hallux, or very limited nail due to previous nail procedure bilateral There is mild callus at the right hallux and medial first metatarsophalangeal joint with surrounding dry skin with no signs of infection. No open lesions present bilateral. Remaining integument unremarkable.  Vasculature:  Dorsalis Pedis pulse 1/4 bilateral. Posterior Tibial pulse 1/4 bilateral. Capillary fill time <5 sec 1-5 bilateral. Scant hair growth to the level of the digits.Temperature gradient within normal limits. Mild varicosities present bilateral. No edema present bilateral.   Neurology: The patient has slightly diminished sensation measured with a 5.07/10g Semmes Weinstein Monofilament at all pedal sites bilateral . Vibratory sensation diminished bilateral with tuning fork. No Babinski sign present bilateral.   Musculoskeletal: Asymptomatic mild hammertoes  pedal deformities noted bilateral. Muscular strength 5/5 in all lower extremity muscular groups bilateral without pain on range of motion . No tenderness with calf compression bilateral.  Assessment and Plan: Problem List Items Addressed This Visit    None    Visit Diagnoses    Dermatophytosis of nail    -  Primary   Porokeratosis       Type 2 diabetes, controlled, with neuropathy (HCC)       PVD (peripheral vascular disease) (HCC)         -Examined patient. -Discussed and educated patient on diabetic foot care, especially with  regards to the vascular, neurological and musculoskeletal systems.  -Stressed the importance of good glycemic control and the detriment of not controlling glucose levels in relation to the foot. -Mechanically debrided all nails bilateral using sterile nail nipper and filed with dremel without incident And smooth callus2 on right foot using rotary bur without incident.  -Recommend continue with  daily skin emollients for dry skin and to decrease recurrence of callus -Answered all patient questions -Patient to return  in 3 months for at risk foot care -Patient advised to call the office if any problems or questions arise in the meantime.  Asencion Islam, DPM

## 2016-10-23 ENCOUNTER — Ambulatory Visit (INDEPENDENT_AMBULATORY_CARE_PROVIDER_SITE_OTHER): Payer: Medicare Other | Admitting: Cardiology

## 2016-10-23 ENCOUNTER — Encounter: Payer: Self-pay | Admitting: Cardiology

## 2016-10-23 VITALS — BP 110/60 | HR 71 | Ht 62.0 in | Wt 119.4 lb

## 2016-10-23 DIAGNOSIS — I10 Essential (primary) hypertension: Secondary | ICD-10-CM | POA: Diagnosis not present

## 2016-10-23 DIAGNOSIS — E782 Mixed hyperlipidemia: Secondary | ICD-10-CM

## 2016-10-23 DIAGNOSIS — I428 Other cardiomyopathies: Secondary | ICD-10-CM

## 2016-10-23 NOTE — Progress Notes (Signed)
Electrophysiology Office Note   Date:  10/23/2016   ID:  Shari [name], Shari [name] 11/21/1934, MRN [id number]  PCP:  Irena Reichmann, DO  Cardiologist:  Dulce Sellar Primary Electrophysiologist:  Klyn Kroening [name], MD    Chief Complaint  Patient presents with  . Defib Check    hronic combined systolic and diastolic HF     History of Present Illness: Shari [name] is a 82 y.o. female who is being seen today for the evaluation of CHF at the request of Irena Reichmann, DO. Presenting today for electrophysiology evaluation. She has a history of hypertensive heart disease, mild coronary artery disease, chronic combined systolic and diastolic heart failure, hyperlipidemia. She has a by the ICD in place.    Today, she denies symptoms of palpitations, chest pain, orthopnea, PND, lower extremity edema, claudication, dizziness, presyncope, syncope, bleeding, or neurologic sequela. The patient is tolerating medications without difficulties. She does get short of breath with exertion. She also has mild fatigue. Most times, when she is exerting herself, she has to stop for a few minutes, and then regains her strength. She says that it takes her a while in the mornings to get moving due to fatigue. When she gets moving she is able to do all of her daily activities.   Past Medical History:  Diagnosis Date  . Anxiety   . Automatic implantable cardioverter-defibrillator in situ 10/25/2014   Overview:  Overview:  dual chamber ICD,upgraded to CRT with LBBB and nonischemic cardiomyopathy  . Biventricular ICD (implantable cardioverter-defibrillator) in place 10/25/2014   Overview:  BIV -ICD,upgraded to CRT with LBBB and nonischemic cardiomyopathy  . Chronic coronary artery disease 10/25/2014  . Costochondritis 10/25/2014  . Diabetes mellitus 2 years  . Diverticulitis   . Elevated troponin 06/04/2016   Last Assessment & Plan:  Likely may be related to ventricular pacing and underlying cardiomyopathy.  The trajectory of  the biomarker elevation and neither is her clinical history suggestive of acute coronary syndrome.  Further stratification with nuclear stress testing  . GERD (gastroesophageal reflux disease)   . Glaucoma   . High cholesterol   . Hypertension   . Hypertensive heart disease with heart failure (HCC) 10/25/2014  . Hypokalemia 06/04/2016   Last Assessment & Plan:  Replace with potassium chloride.  . IBS (irritable bowel syndrome)   . 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 Mild CAD 10/25/2014  . Onychomycosis 11/10/2010  . Osteoarthritis   . PAD (peripheral artery disease) (HCC) 11/03/2014   Overview:  status post insertion of aortic stent and left common iliac stent by Dr. Imogene Burn in August of 2016  . Peripheral arterial occlusive disease (HCC) 11/03/2014  . Precordial chest pain 06/04/2016   Last Assessment & Plan:  My suspicion is that this is likely related to gastroesophageal reflux disease with acute indigestion last evening.  The biomarker elevation is not completely unexpected in this elderly patient with reported cardiomyopathy and current demand ventricular pacing.  Nonetheless she does have a history of extensive vascular disease (though apparently no significant coronary atherosclerosis in 2013) so we Veola Cafaro proceed with a noninvasive risk stratification with pharmacological nuclear stress testing.  Assuming this is a low risk or normal study then I would not advise any invasive cardiac evaluation at this time.  . Preoperative cardiovascular examination 04/02/2015  . Rheumatoid arthritis involving multiple sites (HCC) 05/21/2015  . Stroke (HCC)   . Vitamin D deficiency 06/20/2016   Past Surgical History:  Procedure Laterality Date  .  FOOT SURGERY    . pacemaker surgery     a-fib and was done july 2014  . PERIPHERAL VASCULAR CATHETERIZATION N/A 11/05/2014   Procedure: Abdominal Aortogram;  Surgeon: Fransisco Hertz, MD;  Location: [facility] INVASIVE CV LAB;  Service:  Cardiovascular;  Laterality: N/A;     Current Outpatient Prescriptions  Medication Sig Dispense Refill  . Acetaminophen (TYLENOL ARTHRITIS PAIN PO) Take 2 tablets by mouth 2 (two) times daily. Reported on 07/07/2015    . ALPRAZolam (XANAX) 0.25 MG tablet Take 0.25 mg by mouth daily as needed for anxiety.    . Ascorbic Acid (VITAMIN C) 1000 MG tablet Take 1,000 mg by mouth daily.    Marland Kitchen atorvastatin (LIPITOR) 10 MG tablet Take 10 mg by mouth at bedtime.  11  . Calcium Carbonate-Vit D-Min (CALCIUM 1200 PO) Take 1 tablet by mouth daily.     . carvedilol (COREG) 25 MG tablet Take 25 mg by mouth 2 (two) times daily with a meal.      . clopidogrel (PLAVIX) 75 MG tablet Take 1 tablet (75 mg total) by mouth daily. 90 tablet 3  . DEXILANT 60 MG capsule Take 60 mg by mouth daily.     . furosemide (LASIX) 40 MG tablet Take 40 mg by mouth 2 (two) times daily.     Marland Kitchen glipiZIDE (GLUCOTROL) 10 MG tablet Take 20 mg by mouth 2 (two) times daily before a meal.     . isosorbide mononitrate (IMDUR) 60 MG 24 hr tablet Take 60 mg by mouth 2 (two) times daily.     . montelukast (SINGULAIR) 10 MG tablet Take 10 mg by mouth daily as needed for cough.    . Multiple Vitamin (MULTIVITAMIN PO) Take 1 tablet by mouth daily.     Marland Kitchen NITROSTAT 0.4 MG SL tablet Place 0.4 mg under the tongue every 5 (five) minutes as needed.     . potassium chloride (K-DUR) 10 MEQ tablet Take 10 mEq by mouth 2 (two) times daily.     . promethazine (PHENERGAN) 25 MG tablet Take 1 tablet by mouth daily as needed for nausea.    . ramipril (ALTACE) 10 MG capsule Take 10 mg by mouth daily.      Marland Kitchen RANEXA 500 MG 12 hr tablet Take 500 mg by mouth every 12 (twelve) hours.  2  . ranitidine (ZANTAC) 300 MG tablet Take 300 mg by mouth at bedtime.     . Vitamin D, Ergocalciferol, (DRISDOL) 50000 units CAPS capsule Take 1 capsule by mouth once a week.     No current facility-administered medications for this visit.     Allergies:   Codeine; Ivp dye  [iodinated diagnostic agents]; and Metrizamide   Social History:  The patient  reports that she has never smoked. She has never used smokeless tobacco. She reports that she does not drink alcohol or use drugs.   Family History:  The patient's family history includes Alzheimer's disease in her mother; Arthritis in her mother and sister; Bladder Cancer in her father; Heart attack in her maternal grandfather and maternal grandmother; Hyperlipidemia in her father and mother; Vitiligo in her sister.    ROS:  Please see the history of present illness.   Otherwise, review of systems is positive for chills, hearing loss, visual changes, cough, shortness of breath with activity, nausea, anxiety, headaches, easy bruising.   All other systems are reviewed and negative.    PHYSICAL EXAM: VS:  BP 110/60   Pulse 71  Ht 5\' 2"  (1.575 m)   Wt 119 lb 6.4 oz (54.2 kg)   BMI 21.84 kg/m  , BMI Body mass index is 21.84 kg/m. GEN: Well nourished, well developed, in no acute distress  HEENT: normal  Neck: no JVD, carotid bruits, or masses Cardiac: RRR; no murmurs, rubs, or gallops,no edema  Respiratory:  clear to auscultation bilaterally, normal work of breathing GI: soft, nontender, nondistended, + BS MS: no deformity or atrophy  Skin: warm and dry, device pocket is well healed Neuro:  Strength and sensation are intact Psych: euthymic mood, full affect  EKG:  EKG is ordered today. Personal review of the ekg ordered shows sinus rhythm, V paced  Device interrogation is reviewed today in detail.  See PaceArt for details.   Recent Labs: No results found for requested labs within last 8760 hours.    Lipid Panel  No results found for: CHOL, TRIG, HDL, CHOLHDL, VLDL, LDLCALC, LDLDIRECT   Wt Readings from Last 3 Encounters:  10/23/16 119 lb 6.4 oz (54.2 kg)  10/16/16 120 lb 6.4 oz (54.6 kg)  05/10/16 120 lb (54.4 kg)      Other studies Reviewed: Additional studies/ records that were reviewed  today include: Epic notes, SPECT 2018  Review of the above records today demonstrates:   Left ventricular wall motion: There is normal contractility of all wall segments. Left ventricular ejection fraction: 77%. Right ventricular wall motion: Normal   ASSESSMENT AND PLAN:  1.  Chronic systolic and diastolic heart failure: On optimal medical therapy with carvedilol and ramipril. Status post [employer] BiV ICD. Device is functioning appropriately. She does have a high threshold on her LV lead. It is set to the most appropriate vector currently. She has 2 years left on her battery. We'll set her up for remote monitoring to be sent to our office.  2. Hypertension: BP well controlled. No changes at this time.  3. Hyperlipidemia: continue atorvastatin  4. Coronary artery disease: on aspirin and plavix. No current chest pain.  Current medicines are reviewed at length with the patient today.   The patient does not have concerns regarding her medicines.  The following changes were made today:  none  Labs/ tests ordered today include:  Orders Placed This Encounter  Procedures  . EKG 12-Lead     Disposition:   FU with Rachna Schonberger 1 year  Signed, Juliona Vales 2019, MD  10/23/2016 2:28 PM     [facility] [address] [place] [phone] (office) [phone] (fax)

## 2016-10-24 ENCOUNTER — Telehealth: Payer: Self-pay | Admitting: Cardiology

## 2016-10-24 NOTE — Telephone Encounter (Signed)
Release Of Information faxed to [facility].

## 2016-11-07 ENCOUNTER — Telehealth: Payer: Self-pay | Admitting: Cardiology

## 2016-11-07 NOTE — Telephone Encounter (Signed)
Records received from [facility]. Placed in Chart Prep.

## 2016-12-02 DIAGNOSIS — M109 Gout, unspecified: Secondary | ICD-10-CM | POA: Insufficient documentation

## 2016-12-02 DIAGNOSIS — I701 Atherosclerosis of renal artery: Secondary | ICD-10-CM

## 2016-12-02 DIAGNOSIS — N200 Calculus of kidney: Secondary | ICD-10-CM | POA: Insufficient documentation

## 2016-12-02 DIAGNOSIS — I447 Left bundle-branch block, unspecified: Secondary | ICD-10-CM

## 2016-12-02 DIAGNOSIS — G43909 Migraine, unspecified, not intractable, without status migrainosus: Secondary | ICD-10-CM | POA: Insufficient documentation

## 2016-12-02 HISTORY — DX: Left bundle-branch block, unspecified: I44.7

## 2016-12-02 HISTORY — DX: Atherosclerosis of renal artery: I70.1

## 2016-12-02 HISTORY — DX: Migraine, unspecified, not intractable, without status migrainosus: G43.909

## 2016-12-02 HISTORY — DX: Gout, unspecified: M10.9

## 2016-12-02 HISTORY — DX: Calculus of kidney: N20.0

## 2016-12-05 DIAGNOSIS — N183 Chronic kidney disease, stage 3 unspecified: Secondary | ICD-10-CM

## 2016-12-05 HISTORY — DX: Chronic kidney disease, stage 3 unspecified: N18.30

## 2017-01-19 ENCOUNTER — Ambulatory Visit (INDEPENDENT_AMBULATORY_CARE_PROVIDER_SITE_OTHER): Payer: Medicare Other | Admitting: Sports Medicine

## 2017-01-19 DIAGNOSIS — I739 Peripheral vascular disease, unspecified: Secondary | ICD-10-CM

## 2017-01-19 DIAGNOSIS — E1140 Type 2 diabetes mellitus with diabetic neuropathy, unspecified: Secondary | ICD-10-CM

## 2017-01-19 DIAGNOSIS — B351 Tinea unguium: Secondary | ICD-10-CM | POA: Diagnosis not present

## 2017-01-19 DIAGNOSIS — Q828 Other specified congenital malformations of skin: Secondary | ICD-10-CM

## 2017-01-19 NOTE — Progress Notes (Signed)
Subjective: Shari [name] is a 82 y.o. female patient with history of diabetes who returns to office today complaining of dry callus skin on the right foot and long, painful nails  while ambulating in shoes; unable to trim. Patient states that the glucose reading this morning good, 134, last A1c 7.6. Patient denies any new changes in medication or new problems.    Patient Active Problem List   Diagnosis Date Noted  . Vitamin D deficiency 06/20/2016  . Elevated troponin 06/04/2016  . Hypokalemia 06/04/2016  . Precordial chest pain 06/04/2016  . 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 Rheumatoid arthritis involving multiple sites (HCC) 05/21/2015  . Preoperative cardiovascular examination 04/02/2015  . PAD (peripheral artery disease) (HCC) 11/03/2014  . Peripheral arterial occlusive disease (HCC) 11/03/2014  . Automatic implantable cardioverter-defibrillator in situ 10/25/2014  . 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 Chronic combined systolic and diastolic heart failure (HCC) 10/25/2014  . Costochondritis 10/25/2014  . Mild CAD 10/25/2014  . High cholesterol 11/10/2010  . Hypertensive heart failure (HCC) 11/10/2010  . ibs 11/10/2010  . Osteoarthritis 11/10/2010  . Diverticulitis 11/10/2010  . GERD (gastroesophageal reflux disease) 11/10/2010  . Glaucoma 11/10/2010  . Diabetes mellitus 11/10/2010  . Onychomycosis 11/10/2010   Current Outpatient Prescriptions on File Prior to Visit  Medication Sig Dispense Refill  . Acetaminophen (TYLENOL ARTHRITIS PAIN PO) Take 2 tablets by mouth 2 (two) times daily. Reported on 07/07/2015    . ALPRAZolam (XANAX) 0.25 MG tablet Take 0.25 mg by mouth daily as needed for anxiety.    . Ascorbic Acid (VITAMIN C) 1000 MG tablet Take 1,000 mg by mouth daily.    Marland Kitchen atorvastatin (LIPITOR) 10 MG tablet Take 10 mg by mouth at bedtime.  11  . Calcium Carbonate-Vit D-Min (CALCIUM 1200 PO)  Take 1 tablet by mouth daily.     . carvedilol (COREG) 25 MG tablet Take 25 mg by mouth 2 (two) times daily with a meal.      . clopidogrel (PLAVIX) 75 MG tablet Take 1 tablet (75 mg total) by mouth daily. 90 tablet 3  . DEXILANT 60 MG capsule Take 60 mg by mouth daily.     . furosemide (LASIX) 40 MG tablet Take 40 mg by mouth 2 (two) times daily.     Marland Kitchen glipiZIDE (GLUCOTROL) 10 MG tablet Take 10 mg by mouth 2 (two) times daily before a meal.     . isosorbide mononitrate (IMDUR) 60 MG 24 hr tablet Take 60 mg by mouth 2 (two) times daily.     . montelukast (SINGULAIR) 10 MG tablet Take 10 mg by mouth daily as needed for cough.    . Multiple Vitamin (MULTIVITAMIN PO) Take 1 tablet by mouth daily.     Marland Kitchen NITROSTAT 0.4 MG SL tablet Place 0.4 mg under the tongue every 5 (five) minutes as needed.     . potassium chloride (K-DUR) 10 MEQ tablet Take 10 mEq by mouth 2 (two) times daily.     . promethazine (PHENERGAN) 25 MG tablet Take 1 tablet by mouth daily as needed for nausea.    . ramipril (ALTACE) 10 MG capsule Take 10 mg by mouth daily.      Marland Kitchen RANEXA 500 MG 12 hr tablet Take 500 mg by mouth every 12 (twelve) hours.  2  . ranitidine (ZANTAC) 300 MG tablet Take 300 mg by mouth at bedtime.     . Vitamin D, Ergocalciferol, (DRISDOL) 50000 units  CAPS capsule Take 1 capsule by mouth once a week.     No current facility-administered medications on file prior to visit.    Allergies  Allergen Reactions  . Codeine Diarrhea, Nausea And Vomiting and Other (See Comments)    All-over body aches, too  . Ivp Dye [Iodinated Diagnostic Agents] Nausea And Vomiting and Other (See Comments)  . Metrizamide Nausea And Vomiting and Other (See Comments)    No results found for this or any previous visit (from the past 2160 hour(s)).  Objective: General: Patient is awake, alert, and oriented x 3 and in no acute distress.  Integument: Skin is warm, dry and supple bilateral. Nails are tender, long, thickened and  dystrophic with subungual debris, consistent with onychomycosis, 2-5 bilateral. There are no nails on bilateral hallux, or very limited nail due to previous nail procedure bilateral There is mild callus at the right hallux and medial first metatarsophalangeal joint with surrounding dry skin with no signs of infection. No open lesions present bilateral. Remaining integument unremarkable.  Vasculature: Dorsalis Pedis pulse 1/4 bilateral. Posterior Tibial pulse 1/4 bilateral. Capillary fill time <5 sec 1-5 bilateral. Scant hair growth to the level of the digits.Temperature gradient within normal limits. Mild varicosities present bilateral. No edema present bilateral.   Neurology: The patient has slightly diminished sensation measured with a 5.07/10g Semmes Weinstein Monofilament at all pedal sites bilateral . Vibratory sensation diminished bilateral with tuning fork. No Babinski sign present bilateral.   Musculoskeletal: Asymptomatic mild hammertoes pedal deformities noted bilateral. Muscular strength 5/5 in all lower extremity muscular groups bilateral without pain on range of motion . No tenderness with calf compression bilateral.  Assessment and Plan: Problem List Items Addressed This Visit    None    Visit Diagnoses    Dermatophytosis of nail    -  Primary   Porokeratosis       Type 2 diabetes, controlled, with neuropathy (HCC)       Relevant Medications   JANUVIA 100 MG tablet   PVD (peripheral vascular disease) (HCC)         -Examined patient. -Discussed and educated patient on diabetic foot care, especially with  regards to the vascular, neurological and musculoskeletal systems.  -Stressed the importance of good glycemic control and the detriment of not controlling glucose levels in relation to the foot. -Mechanically debrided all nails bilateral using sterile nail nipper and filed with dremel without incident & smoothed callus2 on right foot using rotary bur without incident.   -Recommend continue with daily skin emollients for dry skin and to decrease recurrence of callus -Answered all patient questions -Patient to return  in 3 months for at risk foot care -Patient advised to call the office if any problems or questions arise in the meantime.  Asencion Islam, DPM

## 2017-01-19 NOTE — Progress Notes (Signed)
   Subjective:    Patient ID: Shari [name], female    DOB: Jul 18, 1934, 82 y.o.   MRN: [id number]  HPI    Review of Systems  All other systems reviewed and are negative.      Objective:   Physical Exam        Assessment & Plan:

## 2017-01-22 ENCOUNTER — Ambulatory Visit (INDEPENDENT_AMBULATORY_CARE_PROVIDER_SITE_OTHER): Payer: Medicare Other | Admitting: *Deleted

## 2017-01-22 ENCOUNTER — Telehealth: Payer: Self-pay | Admitting: Cardiology

## 2017-01-22 DIAGNOSIS — I428 Other cardiomyopathies: Secondary | ICD-10-CM

## 2017-01-22 NOTE — Progress Notes (Signed)
Remote ICD transmission.   

## 2017-01-22 NOTE — Telephone Encounter (Signed)
LMOVM reminding pt to send remote transmission.   

## 2017-01-24 ENCOUNTER — Other Ambulatory Visit: Payer: Self-pay

## 2017-01-24 LAB — CUP PACEART REMOTE DEVICE CHECK
Battery Remaining Percentage: 35 %
Battery Voltage: 2.92 V
Brady Statistic AP VS Percent: 1 %
Brady Statistic AS VP Percent: 99 %
Brady Statistic AS VS Percent: 1 %
Brady Statistic RA Percent Paced: 1 %
Date Time Interrogation Session: 20181022203048
HighPow Impedance: 64 Ohm
HighPow Impedance: 64 Ohm
Implantable Lead Implant Date: 20140702
Implantable Lead Implant Date: 20150730
Implantable Lead Location: 753859
Implantable Lead Model: 7122
Implantable Pulse Generator Implant Date: 20150730
Lead Channel Impedance Value: 460 Ohm
Lead Channel Pacing Threshold Amplitude: 0.75 V
Lead Channel Pacing Threshold Amplitude: 0.75 V
Lead Channel Pacing Threshold Pulse Width: 0.5 ms
Lead Channel Pacing Threshold Pulse Width: 1 ms
Lead Channel Sensing Intrinsic Amplitude: 11.7 mV
Lead Channel Setting Pacing Amplitude: 2 V
Lead Channel Setting Pacing Amplitude: 2 V
Lead Channel Setting Pacing Amplitude: 3 V
Lead Channel Setting Pacing Pulse Width: 0.5 ms
Lead Channel Setting Pacing Pulse Width: 1 ms
MDC IDC LEAD IMPLANT DT: 20140702
MDC IDC LEAD LOCATION: 753858
MDC IDC LEAD LOCATION: 753860
MDC IDC MSMT BATTERY REMAINING LONGEVITY: 20 mo
MDC IDC MSMT LEADCHNL LV IMPEDANCE VALUE: 480 Ohm
MDC IDC MSMT LEADCHNL LV PACING THRESHOLD AMPLITUDE: 2.5 V
MDC IDC MSMT LEADCHNL RA SENSING INTR AMPL: 2.5 mV
MDC IDC MSMT LEADCHNL RV IMPEDANCE VALUE: 340 Ohm
MDC IDC MSMT LEADCHNL RV PACING THRESHOLD PULSEWIDTH: 0.5 ms
MDC IDC SET LEADCHNL RV SENSING SENSITIVITY: 0.5 mV
MDC IDC STAT BRADY AP VP PERCENT: 1 %
Pulse Gen Serial Number: 7147055

## 2017-01-24 MED ORDER — ISOSORBIDE MONONITRATE ER 60 MG PO TB24: 60.0000 mg | ORAL_TABLET | Freq: Every day | ORAL | 2 refills | Status: DC

## 2017-01-26 ENCOUNTER — Encounter: Payer: Self-pay | Admitting: Cardiology

## 2017-01-31 ENCOUNTER — Other Ambulatory Visit: Payer: Self-pay

## 2017-01-31 MED ORDER — RANEXA 500 MG PO TB12: 500.0000 mg | ORAL_TABLET | Freq: Two times a day (BID) | ORAL | 2 refills | Status: DC

## 2017-02-07 ENCOUNTER — Other Ambulatory Visit: Payer: Self-pay

## 2017-02-07 MED ORDER — POTASSIUM CHLORIDE ER 10 MEQ PO TBCR: 10.0000 meq | EXTENDED_RELEASE_TABLET | Freq: Two times a day (BID) | ORAL | 2 refills | Status: DC

## 2017-04-04 ENCOUNTER — Other Ambulatory Visit: Payer: Self-pay

## 2017-04-04 MED ORDER — FUROSEMIDE 40 MG PO TABS: 80.0000 mg | ORAL_TABLET | Freq: Two times a day (BID) | ORAL | 11 refills | Status: DC

## 2017-04-11 ENCOUNTER — Other Ambulatory Visit: Payer: Self-pay

## 2017-04-11 MED ORDER — ATORVASTATIN CALCIUM 10 MG PO TABS: 10.0000 mg | ORAL_TABLET | Freq: Every day | ORAL | 1 refills | Status: DC

## 2017-04-11 MED ORDER — FUROSEMIDE 80 MG PO TABS: 80.0000 mg | ORAL_TABLET | Freq: Two times a day (BID) | ORAL | 1 refills | Status: DC

## 2017-04-23 ENCOUNTER — Ambulatory Visit (INDEPENDENT_AMBULATORY_CARE_PROVIDER_SITE_OTHER): Payer: Medicare Other | Admitting: *Deleted

## 2017-04-23 DIAGNOSIS — I428 Other cardiomyopathies: Secondary | ICD-10-CM | POA: Diagnosis not present

## 2017-04-23 NOTE — Progress Notes (Signed)
Remote ICD transmission.   

## 2017-04-24 ENCOUNTER — Encounter: Payer: Self-pay | Admitting: Cardiology

## 2017-04-25 ENCOUNTER — Ambulatory Visit: Payer: Medicare Other | Admitting: Sports Medicine

## 2017-04-25 ENCOUNTER — Encounter: Payer: Self-pay | Admitting: Sports Medicine

## 2017-04-25 DIAGNOSIS — B351 Tinea unguium: Secondary | ICD-10-CM | POA: Diagnosis not present

## 2017-04-25 DIAGNOSIS — I739 Peripheral vascular disease, unspecified: Secondary | ICD-10-CM

## 2017-04-25 DIAGNOSIS — Q828 Other specified congenital malformations of skin: Secondary | ICD-10-CM

## 2017-04-25 DIAGNOSIS — E1140 Type 2 diabetes mellitus with diabetic neuropathy, unspecified: Secondary | ICD-10-CM

## 2017-04-25 NOTE — Progress Notes (Signed)
Subjective: Shari [name] is a 83 y.o. female patient with history of diabetes who returns to office today complaining of callus skin on the right foot and long, painful nails  while ambulating in shoes; unable to trim. Patient states that the glucose reading this morning good, 188. Patient denies any new changes in medication or new problems.  Saw PCP 2 months ago has appt on 05-21-17.   Patient Active Problem List   Diagnosis Date Noted  . Vitamin D deficiency 06/20/2016  . Elevated troponin 06/04/2016  . Hypokalemia 06/04/2016  . Precordial chest pain 06/04/2016  . 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 Rheumatoid arthritis involving multiple sites (HCC) 05/21/2015  . Preoperative cardiovascular examination 04/02/2015  . PAD (peripheral artery disease) (HCC) 11/03/2014  . Peripheral arterial occlusive disease (HCC) 11/03/2014  . Automatic implantable cardioverter-defibrillator in situ 10/25/2014  . 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 Chronic combined systolic and diastolic heart failure (HCC) 10/25/2014  . Costochondritis 10/25/2014  . Mild CAD 10/25/2014  . High cholesterol 11/10/2010  . Hypertensive heart failure (HCC) 11/10/2010  . ibs 11/10/2010  . Osteoarthritis 11/10/2010  . Diverticulitis 11/10/2010  . GERD (gastroesophageal reflux disease) 11/10/2010  . Glaucoma 11/10/2010  . Diabetes mellitus 11/10/2010  . Onychomycosis 11/10/2010   Current Outpatient Medications on File Prior to Visit  Medication Sig Dispense Refill  . Acetaminophen (TYLENOL ARTHRITIS PAIN PO) Take 2 tablets by mouth 2 (two) times daily. Reported on 07/07/2015    . ALPRAZolam (XANAX) 0.25 MG tablet Take 0.25 mg by mouth daily as needed for anxiety.    . Ascorbic Acid (VITAMIN C) 1000 MG tablet Take 1,000 mg by mouth daily.    Marland Kitchen atorvastatin (LIPITOR) 10 MG tablet Take 1 tablet (10 mg total) by mouth at bedtime. 90 tablet 1  .  Calcium Carbonate-Vit D-Min (CALCIUM 1200 PO) Take 1 tablet by mouth daily.     . carvedilol (COREG) 25 MG tablet Take 25 mg by mouth 2 (two) times daily with a meal.      . clopidogrel (PLAVIX) 75 MG tablet Take 1 tablet (75 mg total) by mouth daily. 90 tablet 3  . DEXILANT 60 MG capsule Take 60 mg by mouth daily.     . furosemide (LASIX) 80 MG tablet Take 1 tablet (80 mg total) by mouth 2 (two) times daily. 180 tablet 1  . glipiZIDE (GLUCOTROL) 10 MG tablet Take 10 mg by mouth 2 (two) times daily before a meal.     . isosorbide mononitrate (IMDUR) 60 MG 24 hr tablet Take 1 tablet (60 mg total) by mouth daily. 90 tablet 2  . JANUVIA 100 MG tablet TAKE ONE TABLET BY MOUTH DAILY FOR diabetes  3  . montelukast (SINGULAIR) 10 MG tablet Take 10 mg by mouth daily as needed for cough.    . Multiple Vitamin (MULTIVITAMIN PO) Take 1 tablet by mouth daily.     Marland Kitchen NITROSTAT 0.4 MG SL tablet Place 0.4 mg under the tongue every 5 (five) minutes as needed.     . potassium chloride (K-DUR) 10 MEQ tablet Take 1 tablet (10 mEq total) 2 (two) times daily by mouth. 180 tablet 2  . promethazine (PHENERGAN) 25 MG tablet Take 1 tablet by mouth daily as needed for nausea.    . ramipril (ALTACE) 10 MG capsule Take 10 mg by mouth daily.      Marland Kitchen RANEXA 500 MG 12 hr tablet Take 1 tablet (500 mg  total) by mouth every 12 (twelve) hours. 180 tablet 2  . ranitidine (ZANTAC) 300 MG tablet Take 300 mg by mouth at bedtime.     . Vitamin D, Ergocalciferol, (DRISDOL) 50000 units CAPS capsule Take 1 capsule by mouth once a week.     No current facility-administered medications on file prior to visit.    Allergies  Allergen Reactions  . Codeine Diarrhea, Nausea And Vomiting and Other (See Comments)    All-over body aches, too  . Ivp Dye [Iodinated Diagnostic Agents] Nausea And Vomiting and Other (See Comments)  . Metrizamide Nausea And Vomiting and Other (See Comments)    No results found for this or any previous visit (from  the past 2160 hour(s)).  Objective: General: Patient is awake, alert, and oriented x 3 and in no acute distress.  Integument: Skin is warm, dry and supple bilateral. Nails are tender, long, thickened and dystrophic with subungual debris, consistent with onychomycosis, 2-5 bilateral. There are no nails on bilateral hallux, or very limited nail due to previous nail procedure bilateral There is mild callus at the right hallux and medial first metatarsophalangeal joint with surrounding dry skin with no signs of infection. No open lesions present bilateral. Remaining integument unremarkable.  Vasculature: Dorsalis Pedis pulse 1/4 bilateral. Posterior Tibial pulse 1/4 bilateral. Capillary fill time <5 sec 1-5 bilateral. Scant hair growth to the level of the digits.Temperature gradient within normal limits. Mild varicosities present bilateral. No edema present bilateral.   Neurology: The patient has slightly diminished sensation measured with a 5.07/10g Semmes Weinstein Monofilament at all pedal sites bilateral . Vibratory sensation diminished bilateral with tuning fork. No Babinski sign present bilateral.   Musculoskeletal: Asymptomatic mild hammertoes pedal deformities noted bilateral. Muscular strength 5/5 in all lower extremity muscular groups bilateral without pain on range of motion . No tenderness with calf compression bilateral.  Assessment and Plan: Problem List Items Addressed This Visit    None    Visit Diagnoses    Dermatophytosis of nail    -  Primary   Porokeratosis       Type 2 diabetes, controlled, with neuropathy (HCC)       PVD (peripheral vascular disease) (HCC)         -Examined patient. -Discussed and educated patient on diabetic foot care, especially with  regards to the vascular, neurological and musculoskeletal systems.  -Stressed the importance of good glycemic control and the detriment of not controlling glucose levels in relation to the foot. -Mechanically debrided all  nails bilateral using sterile nail nipper and filed with dremel without incident & smoothed callus2 on right foot using rotary bur without incident.  -Recommend continue with daily skin emollients for dry skin and to decrease recurrence of callus as pervious -Answered all patient questions -Patient to return  in 3 months for at risk foot care -Patient advised to call the office if any problems or questions arise in the meantime.  Asencion Islam, DPM

## 2017-05-01 ENCOUNTER — Ambulatory Visit: Payer: Medicare Other | Admitting: Cardiology

## 2017-05-01 VITALS — BP 112/60 | HR 76 | Ht 62.0 in | Wt 124.0 lb

## 2017-05-01 DIAGNOSIS — E7800 Pure hypercholesterolemia, unspecified: Secondary | ICD-10-CM

## 2017-05-01 DIAGNOSIS — I110 Hypertensive heart disease with heart failure: Secondary | ICD-10-CM

## 2017-05-01 DIAGNOSIS — I5042 Chronic combined systolic (congestive) and diastolic (congestive) heart failure: Secondary | ICD-10-CM

## 2017-05-01 DIAGNOSIS — I2510 Atherosclerotic heart disease of native coronary artery without angina pectoris: Secondary | ICD-10-CM | POA: Diagnosis not present

## 2017-05-01 DIAGNOSIS — Z95810 Presence of automatic (implantable) cardiac defibrillator: Secondary | ICD-10-CM | POA: Diagnosis not present

## 2017-05-01 NOTE — Patient Instructions (Signed)
Medication Instructions Your physician recommends that you continue on your current medications as directed. Please refer to the Current Medication list given to you today.  Labwork: None  Testing/Procedures: Your physician has requested that you have an echocardiogram. Echocardiography is a painless test that uses sound waves to create images of your heart. It provides your doctor with information about the size and shape of your heart and how well your heart's chambers and valves are working. This procedure takes approximately one hour. There are no restrictions for this procedure.  Follow-Up: Your physician recommends that you schedule a follow-up appointment in: 3 months  Any Other Special Instructions Will Be Listed Below (If Applicable).  If you need a refill on your cardiac medications before your next appointment, please call your pharmacy.  

## 2017-05-01 NOTE — Progress Notes (Signed)
Cardiology Office Note:    Date:  05/01/2017   ID:  Shari [name], DOB [birth year], MRN [id number]  PCP:  Olive Bass, MD  Cardiologist:  Norman Herrlich, MD    Referring MD: Olive Bass, MD    ASSESSMENT:    1. Chronic combined systolic and diastolic heart failure (HCC)   2. Hypertensive heart failure (HCC)   3. Mild CAD   4. Biventricular ICD (implantable cardioverter-defibrillator) in place   5. High cholesterol    PLAN:    In order of problems listed above:  1. She is markedly improved with medical therapy CRT and her ejection fraction is normalized.  Her heart failure is almost asymptomatic class II and she has no evidence of volume overload.  I asked her to continue current medical treatment with beta-blocker loop diuretic ACE inhibitor.  With normalization of heart failure ejection fraction and clinical stability I would not transition to ARNI at this time.   2. Stable continue medical therapy including clopidogrel stable blood pressure target continue current treatment 3. Stable continue treatment including clopidogrel and high intensity statin 4. Stable management device clinic she is biventricular paced 100% of the time and optimal 5. Continue her statin      Next appointment: 3 months   Medication Adjustments/Labs and Tests Ordered: Current medicines are reviewed at length with the patient today.  Concerns regarding medicines are outlined above.  Orders Placed This Encounter  Procedures  . ECHOCARDIOGRAM COMPLETE   No orders of the defined types were placed in this encounter.   Chief Complaint  Patient presents with  . Follow-up    6 month flup appt   . Shortness of Breath  . Congestive Heart Failure    with LBBB and CRT-D  . Hypertension    History of Present Illness:    Shari [name] is a 83 y.o. female with a hx of LBBB with heart failure EF 77% 06/04/16, Dyslipidemia, HTN , and CRT-D  last seen 6 months ago. Compliance with diet, lifestyle  and medications: Yes She is pleased with the quality of her life and with the exception of periods of fatigue early in the day is having no exercise intolerance dyspnea chest pain palpitation or syncope.  She has transitioned device therapy to my practice.  Her weights are stable at home she is sodium restricts and is compliant with her diuretic.  She is now focused on caring for her husband is becoming increasingly dependent on her Past Medical History:  Diagnosis Date  . Anxiety   . Automatic implantable cardioverter-defibrillator in situ 10/25/2014   Overview:  Overview:  dual chamber ICD,upgraded to CRT with LBBB and nonischemic cardiomyopathy  . Biventricular ICD (implantable cardioverter-defibrillator) in place 10/25/2014   Overview:  BIV -ICD,upgraded to CRT with LBBB and nonischemic cardiomyopathy  . Chronic coronary artery disease 10/25/2014  . Costochondritis 10/25/2014  . Diabetes mellitus 2 years  . Diverticulitis   . Elevated troponin 06/04/2016   Last Assessment & Plan:  Likely may be related to ventricular pacing and underlying cardiomyopathy.  The trajectory of the biomarker elevation and neither is her clinical history suggestive of acute coronary syndrome.  Further stratification with nuclear stress testing  . GERD (gastroesophageal reflux disease)   . Glaucoma   . High cholesterol   . Hypertension   . Hypertensive heart disease with heart failure (HCC) 10/25/2014  . Hypokalemia 06/04/2016   Last Assessment & Plan:  Replace with potassium chloride.  . IBS (  irritable bowel syndrome)   . 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 Mild CAD 10/25/2014  . Onychomycosis 11/10/2010  . Osteoarthritis   . PAD (peripheral artery disease) (HCC) 11/03/2014   Overview:  status post insertion of aortic stent and left common iliac stent by Dr. Imogene Burn in August of 2016  . Peripheral arterial occlusive disease (HCC) 11/03/2014  . Precordial chest pain 06/04/2016   Last  Assessment & Plan:  My suspicion is that this is likely related to gastroesophageal reflux disease with acute indigestion last evening.  The biomarker elevation is not completely unexpected in this elderly patient with reported cardiomyopathy and current demand ventricular pacing.  Nonetheless she does have a history of extensive vascular disease (though apparently no significant coronary atherosclerosis in 2013) so we will proceed with a noninvasive risk stratification with pharmacological nuclear stress testing.  Assuming this is a low risk or normal study then I would not advise any invasive cardiac evaluation at this time.  . Preoperative cardiovascular examination 04/02/2015  . Rheumatoid arthritis involving multiple sites (HCC) 05/21/2015  . Stroke (HCC)   . Vitamin D deficiency 06/20/2016    Past Surgical History:  Procedure Laterality Date  . FOOT SURGERY    . pacemaker surgery     a-fib and was done july 2014  . PERIPHERAL VASCULAR CATHETERIZATION N/A 11/05/2014   Procedure: Abdominal Aortogram;  Surgeon: Fransisco Hertz, MD;  Location: [facility] INVASIVE CV LAB;  Service: Cardiovascular;  Laterality: N/A;    Current Medications: Current Meds  Medication Sig  . Acetaminophen (TYLENOL ARTHRITIS PAIN PO) Take 2 tablets by mouth 2 (two) times daily. Reported on 07/07/2015  . ALPRAZolam (XANAX) 0.25 MG tablet Take 0.25 mg by mouth daily as needed for anxiety.  . Ascorbic Acid (VITAMIN C) 1000 MG tablet Take 1,000 mg by mouth daily.  Marland Kitchen atorvastatin (LIPITOR) 10 MG tablet Take 1 tablet (10 mg total) by mouth at bedtime.  . Calcium Carbonate-Vit D-Min (CALCIUM 1200 PO) Take 1 tablet by mouth daily.   . carvedilol (COREG) 25 MG tablet Take 25 mg by mouth 2 (two) times daily with a meal.    . clopidogrel (PLAVIX) 75 MG tablet Take 1 tablet (75 mg total) by mouth daily.  Marland Kitchen DEXILANT 60 MG capsule Take 60 mg by mouth daily.   . furosemide (LASIX) 80 MG tablet Take 1 tablet (80 mg total) by mouth 2 (two)  times daily.  Marland Kitchen glipiZIDE (GLUCOTROL) 10 MG tablet Take 10 mg by mouth 2 (two) times daily before a meal.   . isosorbide mononitrate (IMDUR) 60 MG 24 hr tablet Take 1 tablet (60 mg total) by mouth daily.  Marland Kitchen JANUVIA 100 MG tablet TAKE ONE TABLET BY MOUTH DAILY FOR diabetes  . montelukast (SINGULAIR) 10 MG tablet Take 10 mg by mouth daily as needed for cough.  . Multiple Vitamin (MULTIVITAMIN PO) Take 1 tablet by mouth daily.   Marland Kitchen NITROSTAT 0.4 MG SL tablet Place 0.4 mg under the tongue every 5 (five) minutes as needed.   . potassium chloride (K-DUR) 10 MEQ tablet Take 1 tablet (10 mEq total) 2 (two) times daily by mouth.  . promethazine (PHENERGAN) 25 MG tablet Take 1 tablet by mouth daily as needed for nausea.  . ramipril (ALTACE) 10 MG capsule Take 10 mg by mouth daily.    Marland Kitchen RANEXA 500 MG 12 hr tablet Take 1 tablet (500 mg total) by mouth every 12 (twelve) hours.  . ranitidine (ZANTAC) 300  MG tablet Take 300 mg by mouth at bedtime.   . Vitamin D, Ergocalciferol, (DRISDOL) 50000 units CAPS capsule Take 1 capsule by mouth once a week.     Allergies:   Codeine; Ivp dye [iodinated diagnostic agents]; Metformin; Metrizamide; and Pioglitazone   Social History   Socioeconomic History  . Marital status: Married    Spouse name: Not on file  . Number of children: Not on file  . Years of education: Not on file  . Highest education level: Not on file  Social Needs  . Financial resource strain: Not on file  . Food insecurity - worry: Not on file  . Food insecurity - inability: Not on file  . Transportation needs - medical: Not on file  . Transportation needs - non-medical: Not on file  Occupational History  . Not on file  Tobacco Use  . Smoking status: Never Smoker  . Smokeless tobacco: Never Used  Substance and Sexual Activity  . Alcohol use: No    Alcohol/week: 0.0 oz  . Drug use: No  . Sexual activity: Not on file  Other Topics Concern  . Not on file  Social History Narrative  . Not  on file     Family History: The patient's family history includes Alzheimer's disease in her mother; Arthritis in her mother and sister; Bladder Cancer in her father; Heart attack in her maternal grandfather and maternal grandmother; Hyperlipidemia in her father and mother; Vitiligo in her sister. ROS:   Please see the history of present illness.    All other systems reviewed and are negative.  EKGs/Labs/Other Studies Reviewed:    The following studies were reviewed today:  she relates a recent device check but I cannot access it in the electronic health record  Recent Labs: Recent BMP normal at her PCP office No results found for requested labs within last 8760 hours.  Recent Lipid Panel No results found for: CHOL, TRIG, HDL, CHOLHDL, VLDL, LDLCALC, LDLDIRECT  Physical Exam:    VS:  BP 112/60 (BP Location: Right Arm, Patient Position: Sitting, Cuff Size: Normal)   Pulse 76   Ht 5\' 2"  (1.575 m)   Wt 124 lb (56.2 kg)   SpO2 97%   BMI 22.68 kg/m     Wt Readings from Last 3 Encounters:  05/01/17 124 lb (56.2 kg)  10/23/16 119 lb 6.4 oz (54.2 kg)  10/16/16 120 lb 6.4 oz (54.6 kg)     GEN:  Well nourished, well developed in no acute distress HEENT: Normal NECK: No JVD; No carotid bruits LYMPHATICS: No lymphadenopathy CARDIAC: RRR, no murmurs, rubs, gallops RESPIRATORY:  Clear to auscultation without rales, wheezing or rhonchi  ABDOMEN: Soft, non-tender, non-distended MUSCULOSKELETAL:  No edema; No deformity  SKIN: Warm and dry NEUROLOGIC:  Alert and oriented x 3 PSYCHIATRIC:  Normal affect    Signed, Norman Herrlich, MD  05/01/2017 5:00 PM    [facility]

## 2017-05-14 ENCOUNTER — Inpatient Hospital Stay (HOSPITAL_COMMUNITY): Admission: RE | Admit: 2017-05-14 | Payer: Medicare Other | Source: Ambulatory Visit

## 2017-05-14 ENCOUNTER — Encounter (HOSPITAL_COMMUNITY): Payer: Medicare Other

## 2017-05-14 ENCOUNTER — Ambulatory Visit: Payer: Medicare Other | Admitting: Family

## 2017-05-19 LAB — CUP PACEART REMOTE DEVICE CHECK
Battery Remaining Percentage: 30 %
Battery Voltage: 2.92 V
Brady Statistic AP VP Percent: 1 %
Brady Statistic AS VP Percent: 99 %
Brady Statistic RA Percent Paced: 1 %
HIGH POWER IMPEDANCE MEASURED VALUE: 65 Ohm
HighPow Impedance: 65 Ohm
Implantable Lead Implant Date: 20140702
Implantable Lead Implant Date: 20150730
Implantable Lead Location: 753858
Implantable Lead Location: 753859
Implantable Lead Location: 753860
Lead Channel Impedance Value: 330 Ohm
Lead Channel Impedance Value: 490 Ohm
Lead Channel Impedance Value: 540 Ohm
Lead Channel Pacing Threshold Amplitude: 0.75 V
Lead Channel Pacing Threshold Amplitude: 0.75 V
Lead Channel Pacing Threshold Pulse Width: 0.5 ms
Lead Channel Pacing Threshold Pulse Width: 1 ms
Lead Channel Sensing Intrinsic Amplitude: 11.7 mV
Lead Channel Setting Pacing Amplitude: 2 V
Lead Channel Setting Pacing Pulse Width: 0.5 ms
Lead Channel Setting Pacing Pulse Width: 1 ms
MDC IDC LEAD IMPLANT DT: 20140702
MDC IDC MSMT BATTERY REMAINING LONGEVITY: 18 mo
MDC IDC MSMT LEADCHNL LV PACING THRESHOLD AMPLITUDE: 2.5 V
MDC IDC MSMT LEADCHNL RA PACING THRESHOLD PULSEWIDTH: 0.5 ms
MDC IDC MSMT LEADCHNL RA SENSING INTR AMPL: 2.9 mV
MDC IDC PG IMPLANT DT: 20150730
MDC IDC PG SERIAL: 7147055
MDC IDC SESS DTM: 20190121090018
MDC IDC SET LEADCHNL LV PACING AMPLITUDE: 3 V
MDC IDC SET LEADCHNL RV PACING AMPLITUDE: 2 V
MDC IDC SET LEADCHNL RV SENSING SENSITIVITY: 0.5 mV
MDC IDC STAT BRADY AP VS PERCENT: 1 %
MDC IDC STAT BRADY AS VS PERCENT: 1 %

## 2017-05-22 ENCOUNTER — Ambulatory Visit (HOSPITAL_BASED_OUTPATIENT_CLINIC_OR_DEPARTMENT_OTHER)
Admission: RE | Admit: 2017-05-22 | Discharge: 2017-05-22 | Disposition: A | Discharge status: Home / Self Care | Payer: Medicare Other | Source: Ambulatory Visit | Attending: Family Medicine | Admitting: Family Medicine

## 2017-05-22 DIAGNOSIS — E785 Hyperlipidemia, unspecified: Secondary | ICD-10-CM | POA: Diagnosis not present

## 2017-05-22 DIAGNOSIS — I351 Nonrheumatic aortic (valve) insufficiency: Secondary | ICD-10-CM | POA: Diagnosis not present

## 2017-05-22 DIAGNOSIS — I739 Peripheral vascular disease, unspecified: Secondary | ICD-10-CM | POA: Diagnosis not present

## 2017-05-22 DIAGNOSIS — I2510 Atherosclerotic heart disease of native coronary artery without angina pectoris: Secondary | ICD-10-CM | POA: Insufficient documentation

## 2017-05-22 DIAGNOSIS — I110 Hypertensive heart disease with heart failure: Secondary | ICD-10-CM | POA: Diagnosis not present

## 2017-05-22 DIAGNOSIS — I5042 Chronic combined systolic (congestive) and diastolic (congestive) heart failure: Secondary | ICD-10-CM | POA: Diagnosis present

## 2017-05-22 NOTE — Progress Notes (Signed)
Echocardiogram 2D Echocardiogram has been performed.  Shari [name] 05/22/2017, 2:51 PM

## 2017-06-15 DIAGNOSIS — R300 Dysuria: Secondary | ICD-10-CM

## 2017-06-15 HISTORY — DX: Dysuria: R30.0

## 2017-06-19 ENCOUNTER — Telehealth: Payer: Self-pay

## 2017-06-19 NOTE — Telephone Encounter (Signed)
Very minor, I would not change meds or be unduly concerned

## 2017-06-19 NOTE — Telephone Encounter (Signed)
Patient advised no medication changes necessary at this time. Patient verbalized understanding, no further questions.

## 2017-06-19 NOTE — Telephone Encounter (Signed)
Patient is concerned because she had lab work at Dr. Robyne Peers office last week that showed her kidney function is elevated. Dr. Sol Passer said that it may be due to her diuretics. Patient is having repeat lab work next week. Patient wanted to know if she should decrease her diuretics, but also, does not want to start swelling again. Please advise.

## 2017-07-16 ENCOUNTER — Other Ambulatory Visit: Payer: Self-pay

## 2017-07-16 DIAGNOSIS — I7409 Other arterial embolism and thrombosis of abdominal aorta: Secondary | ICD-10-CM

## 2017-07-16 DIAGNOSIS — I739 Peripheral vascular disease, unspecified: Secondary | ICD-10-CM

## 2017-07-23 ENCOUNTER — Ambulatory Visit (INDEPENDENT_AMBULATORY_CARE_PROVIDER_SITE_OTHER): Payer: Medicare Other | Admitting: *Deleted

## 2017-07-23 DIAGNOSIS — I428 Other cardiomyopathies: Secondary | ICD-10-CM

## 2017-07-23 NOTE — Progress Notes (Signed)
Remote ICD transmission.   

## 2017-07-24 ENCOUNTER — Encounter: Payer: Self-pay | Admitting: Cardiology

## 2017-07-24 LAB — CUP PACEART REMOTE DEVICE CHECK
Date Time Interrogation Session: 20190423153641
Implantable Lead Implant Date: 20140702
Implantable Lead Implant Date: 20140702
Implantable Lead Location: 753858
Implantable Lead Location: 753859
Implantable Pulse Generator Implant Date: 20150730
MDC IDC LEAD IMPLANT DT: 20150730
MDC IDC LEAD LOCATION: 753860
Pulse Gen Serial Number: 7147055

## 2017-07-25 ENCOUNTER — Telehealth: Payer: Self-pay | Admitting: Cardiology

## 2017-07-25 NOTE — Telephone Encounter (Signed)
New Message ° ° ° °1. Has your device fired? no ° °2. Is you device beeping? no ° °3. Are you experiencing draining or swelling at device site? no ° °4. Are you calling to see if we received your device transmission? yes ° °5. Have you passed out? no ° ° ° °Please route to Device Clinic Pool ° °

## 2017-07-25 NOTE — Telephone Encounter (Signed)
Transmission received 07/23/17, pt aware and appreciative.

## 2017-07-26 ENCOUNTER — Ambulatory Visit: Payer: Medicare Other | Admitting: Sports Medicine

## 2017-07-26 ENCOUNTER — Encounter: Payer: Self-pay | Admitting: Sports Medicine

## 2017-07-26 DIAGNOSIS — M069 Rheumatoid arthritis, unspecified: Secondary | ICD-10-CM | POA: Diagnosis not present

## 2017-07-26 DIAGNOSIS — E1140 Type 2 diabetes mellitus with diabetic neuropathy, unspecified: Secondary | ICD-10-CM | POA: Diagnosis not present

## 2017-07-26 DIAGNOSIS — I739 Peripheral vascular disease, unspecified: Secondary | ICD-10-CM

## 2017-07-26 DIAGNOSIS — B351 Tinea unguium: Secondary | ICD-10-CM | POA: Diagnosis not present

## 2017-07-26 DIAGNOSIS — Q828 Other specified congenital malformations of skin: Secondary | ICD-10-CM

## 2017-07-26 NOTE — Progress Notes (Signed)
Subjective: Shari [name] is a 83 y.o. female patient with history of diabetes who returns to office today complaining of callus skin and long, painful nails  while ambulating in shoes; unable to trim. Patient states that the glucose reading this morning was 208. Patient denies any new changes in medication or new problems.   Saw PCP, Dr. Sol Passer 1 month ago last A1c was 6.8.    Patient Active Problem List   Diagnosis Date Noted  . Vitamin D deficiency 06/20/2016  . Elevated troponin 06/04/2016  . Hypokalemia 06/04/2016  . Precordial chest pain 06/04/2016  . Rheumatoid arthritis involving multiple sites (HCC) 05/21/2015  . Preoperative cardiovascular examination 04/02/2015  . PAD (peripheral artery disease) (HCC) 11/03/2014  . Peripheral arterial occlusive disease (HCC) 11/03/2014  . Automatic implantable cardioverter-defibrillator in situ 10/25/2014  . 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 Chronic combined systolic and diastolic heart failure (HCC) 10/25/2014  . Costochondritis 10/25/2014  . Mild CAD 10/25/2014  . High cholesterol 11/10/2010  . Hypertensive heart failure (HCC) 11/10/2010  . ibs 11/10/2010  . Osteoarthritis 11/10/2010  . Diverticulitis 11/10/2010  . GERD (gastroesophageal reflux disease) 11/10/2010  . Glaucoma 11/10/2010  . Diabetes mellitus 11/10/2010  . Onychomycosis 11/10/2010   Current Outpatient Medications on File Prior to Visit  Medication Sig Dispense Refill  . Acetaminophen (TYLENOL ARTHRITIS PAIN PO) Take 2 tablets by mouth 2 (two) times daily. Reported on 07/07/2015    . ALPRAZolam (XANAX) 0.25 MG tablet Take 0.25 mg by mouth daily as needed for anxiety.    . Ascorbic Acid (VITAMIN C) 1000 MG tablet Take 1,000 mg by mouth daily.    Marland Kitchen atorvastatin (LIPITOR) 10 MG tablet Take 1 tablet (10 mg total) by mouth at bedtime. 90 tablet 1  . Calcium Carbonate-Vit D-Min (CALCIUM 1200 PO) Take 1 tablet by mouth daily.     .  carvedilol (COREG) 25 MG tablet Take 25 mg by mouth 2 (two) times daily with a meal.      . clopidogrel (PLAVIX) 75 MG tablet Take 1 tablet (75 mg total) by mouth daily. 90 tablet 3  . DEXILANT 60 MG capsule Take 60 mg by mouth daily.     . furosemide (LASIX) 80 MG tablet Take 1 tablet (80 mg total) by mouth 2 (two) times daily. 180 tablet 1  . glipiZIDE (GLUCOTROL) 10 MG tablet Take 10 mg by mouth 2 (two) times daily before a meal.     . isosorbide mononitrate (IMDUR) 60 MG 24 hr tablet Take 1 tablet (60 mg total) by mouth daily. 90 tablet 2  . JANUVIA 100 MG tablet TAKE ONE TABLET BY MOUTH DAILY FOR diabetes  3  . montelukast (SINGULAIR) 10 MG tablet Take 10 mg by mouth daily as needed for cough.    . Multiple Vitamin (MULTIVITAMIN PO) Take 1 tablet by mouth daily.     Marland Kitchen NITROSTAT 0.4 MG SL tablet Place 0.4 mg under the tongue every 5 (five) minutes as needed.     . potassium chloride (K-DUR) 10 MEQ tablet Take 1 tablet (10 mEq total) 2 (two) times daily by mouth. 180 tablet 2  . promethazine (PHENERGAN) 25 MG tablet Take 1 tablet by mouth daily as needed for nausea.    . ramipril (ALTACE) 10 MG capsule Take 10 mg by mouth daily.      Marland Kitchen RANEXA 500 MG 12 hr tablet Take 1 tablet (500 mg total) by mouth every 12 (twelve) hours. 180 tablet 2  .  ranitidine (ZANTAC) 300 MG tablet Take 300 mg by mouth at bedtime.     . Vitamin D, Ergocalciferol, (DRISDOL) 50000 units CAPS capsule Take 1 capsule by mouth once a week.     No current facility-administered medications on file prior to visit.    Allergies  Allergen Reactions  . Codeine Diarrhea, Nausea And Vomiting, Other (See Comments) and Hives    All-over body aches, too  . Ivp Dye [Iodinated Diagnostic Agents] Nausea And Vomiting and Other (See Comments)  . Metformin Other (See Comments)  . Metrizamide Nausea And Vomiting and Other (See Comments)  . Pioglitazone Other (See Comments)    Has CHF    Recent Results (from the past 2160 hour(s))   CUP PACEART REMOTE DEVICE CHECK     Status: None   Collection Time: 07/24/17  7:35 PM  Result Value Ref Range   Pulse Generator Manufacturer [employer]    Date Time Interrogation Session [id number]    Pulse Gen Model 3785-88F Dillon Bjork    Pulse Gen Serial Number [id number]    Clinic Name [facility]    Implantable Pulse Generator Type Cardiac Resynch Therapy Defibulator    Implantable Pulse Generator Implant Date [id number]    Implantable Lead Manufacturer [facility]    Implantable Lead Model 1458Q Quartet    Implantable Lead Serial Number [id number]    Implantable Lead Implant Date [id number]    Implantable Lead Location Detail 1 UNKNOWN    Implantable Lead Location [id number]    Implantable Lead Manufacturer [facility]    Implantable Lead Model [phone] Tendril STS    Implantable Lead Serial Number [id number]    Implantable Lead Implant Date [id number]    Implantable Lead Location Detail 1 UNKNOWN    Implantable Lead Location [id number]    Implantable Lead Manufacturer [facility]    Implantable Lead Model [id number] Durata    Implantable Lead Serial Number [id number]    Implantable Lead Implant Date [id number]    Implantable Lead Location Detail 1 UNKNOWN    Implantable Lead Location [id number]    Eval Rhythm As/Bi-Vp     Objective: General: Patient is awake, alert, and oriented x 3 and in no acute distress.  Integument: Skin is warm, dry and supple bilateral. Nails are tender, long, thickened and dystrophic with subungual debris, consistent with onychomycosis, 2-5 bilateral. There are no nails on bilateral hallux, or very limited nail due to previous nail procedure bilateral There is mild callus at the right hallux and medial first metatarsophalangeal joint with surrounding dry skin with no signs of infection. No open lesions present bilateral. Remaining integument unremarkable.  Vasculature: Dorsalis Pedis pulse 1/4 bilateral. Posterior Tibial pulse 1/4 bilateral. Capillary fill time <5 sec 1-5 bilateral. Scant hair  growth to the level of the digits.Temperature gradient within normal limits. Mild varicosities present bilateral. No edema present bilateral.   Neurology: The patient has slightly diminished sensation measured with a 5.07/10g Semmes Weinstein Monofilament at all pedal sites bilateral . Vibratory sensation diminished bilateral with tuning fork. No Babinski sign present bilateral.   Musculoskeletal: Asymptomatic mild hammertoes pedal deformities noted bilateral. Muscular strength 5/5 in all lower extremity muscular groups bilateral without pain on range of motion . No tenderness with calf compression bilateral.  Assessment and Plan: Problem List Items Addressed This Visit      Cardiovascular and Mediastinum   PAD (peripheral artery disease) (HCC)     Musculoskeletal and Integument   Rheumatoid arthritis involving multiple sites [facility])    Other Visit Diagnoses  Dermatophytosis of nail    -  Primary   Porokeratosis       Type 2 diabetes, controlled, with neuropathy (HCC)         -Examined patient. -Discussed and educated patient on diabetic foot care, especially with  regards to the vascular, neurological and musculoskeletal systems.  -Stressed the importance of good glycemic control and the detriment of not controlling glucose levels in relation to the foot. -Mechanically debrided all nails bilateral using sterile nail nipper and filed with dremel without incident & smoothed callus2 on right foot using rotary bur without incident.  -Continue with skin emollients to callus areas  -Patient to return  in 3 months for at risk foot care -Patient advised to call the office if any problems or questions arise in the meantime.  Asencion Islam, DPM

## 2017-07-29 NOTE — Progress Notes (Signed)
Cardiology Office Note:    Date:  07/30/2017   ID:  Shari [name], DOB 08/25/1934, MRN [id number]  PCP:  Olive Bass, MD  Cardiologist:  Norman Herrlich, MD    Referring MD: Olive Bass, MD    ASSESSMENT:    1. Chronic combined systolic and diastolic heart failure (HCC)   2. Hypertensive heart and kidney disease with heart failure and with chronic kidney disease stage III (HCC)   3. Mild CAD   4. High cholesterol   5. Biventricular ICD (implantable cardioverter-defibrillator) in place    PLAN:    In order of problems listed above:  1. Stable compensated continue current loop diuretic along with beta-blocker ACE inhibitor 2. Stable continue current treatment of cough persist she can be transitioned to a ARB such as Atacand 16 mg daily 3. Stable continue current medical treatment I would not pursue an ischemia evaluation at this time 4. Poorly controlled triglycerides remain elevated she will require additional lipid-lowering therapy with either fish oil, vasepsa or gemfibrozil 5. Stable followed in my practice   Next appointment: 6 months   Medication Adjustments/Labs and Tests Ordered: Current medicines are reviewed at length with the patient today.  Concerns regarding medicines are outlined above.  No orders of the defined types were placed in this encounter.  No orders of the defined types were placed in this encounter.   Chief Complaint  Patient presents with  . Follow-up    pacemaker/ICD  . Congestive Heart Failure  . Hypertension  . Hyperlipidemia  . Coronary Artery Disease    History of Present Illness:    Shari [name] is a 83 y.o. female with a hx of LBBB with heart failure EF 77% 06/04/16, Dyslipidemia, HTN ,stage 3 CKD, PAD   and CRT-D last seen 3 months ago. Compliance with diet, lifestyle and medications: yes She has seasonal allergies and has had a cough.  No chest pain shortness of breath palpitation or syncope Past Medical History:    Diagnosis Date  . Anxiety   . Automatic implantable cardioverter-defibrillator in situ 10/25/2014   Overview:  Overview:  dual chamber ICD,upgraded to CRT with LBBB and nonischemic cardiomyopathy  . Biventricular ICD (implantable cardioverter-defibrillator) in place 10/25/2014   Overview:  BIV -ICD,upgraded to CRT with LBBB and nonischemic cardiomyopathy  . Chronic coronary artery disease 10/25/2014  . Costochondritis 10/25/2014  . Diabetes mellitus 2 years  . Diverticulitis   . Elevated troponin 06/04/2016   Last Assessment & Plan:  Likely may be related to ventricular pacing and underlying cardiomyopathy.  The trajectory of the biomarker elevation and neither is her clinical history suggestive of acute coronary syndrome.  Further stratification with nuclear stress testing  . GERD (gastroesophageal reflux disease)   . Glaucoma   . High cholesterol   . Hypertension   . Hypertensive heart disease with heart failure (HCC) 10/25/2014  . Hypokalemia 06/04/2016   Last Assessment & Plan:  Replace with potassium chloride.  . IBS (irritable bowel syndrome)   . 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 Mild CAD 10/25/2014  . Onychomycosis 11/10/2010  . Osteoarthritis   . PAD (peripheral artery disease) (HCC) 11/03/2014   Overview:  status post insertion of aortic stent and left common iliac stent by Dr. Imogene Burn in August of 2016  . Peripheral arterial occlusive disease (HCC) 11/03/2014  . Precordial chest pain 06/04/2016   Last Assessment & Plan:  My suspicion is that this is likely related to  gastroesophageal reflux disease with acute indigestion last evening.  The biomarker elevation is not completely unexpected in this elderly patient with reported cardiomyopathy and current demand ventricular pacing.  Nonetheless she does have a history of extensive vascular disease (though apparently no significant coronary atherosclerosis in 2013) so we will proceed with a noninvasive risk  stratification with pharmacological nuclear stress testing.  Assuming this is a low risk or normal study then I would not advise any invasive cardiac evaluation at this time.  . Preoperative cardiovascular examination 04/02/2015  . Rheumatoid arthritis involving multiple sites (HCC) 05/21/2015  . Stroke (HCC)   . Vitamin D deficiency 06/20/2016    Past Surgical History:  Procedure Laterality Date  . FOOT SURGERY    . pacemaker surgery     a-fib and was done july 2014  . PERIPHERAL VASCULAR CATHETERIZATION N/A 11/05/2014   Procedure: Abdominal Aortogram;  Surgeon: Fransisco Hertz, MD;  Location: [facility] INVASIVE CV LAB;  Service: Cardiovascular;  Laterality: N/A;    Current Medications: Current Meds  Medication Sig  . Acetaminophen (TYLENOL ARTHRITIS PAIN PO) Take 2 tablets by mouth 2 (two) times daily. Reported on 07/07/2015  . ALPRAZolam (XANAX) 0.25 MG tablet Take 0.25 mg by mouth daily as needed for anxiety.  . Ascorbic Acid (VITAMIN C) 1000 MG tablet Take 1,000 mg by mouth daily.  Marland Kitchen atorvastatin (LIPITOR) 10 MG tablet Take 1 tablet (10 mg total) by mouth at bedtime.  . Calcium Carbonate-Vit D-Min (CALCIUM 1200 PO) Take 1 tablet by mouth daily.   . carvedilol (COREG) 25 MG tablet Take 25 mg by mouth 2 (two) times daily with a meal.    . clopidogrel (PLAVIX) 75 MG tablet Take 1 tablet (75 mg total) by mouth daily.  Marland Kitchen DEXILANT 60 MG capsule Take 60 mg by mouth daily.   . furosemide (LASIX) 80 MG tablet TAKE TWO TABLETS BY MOUTH TWICE DAILY  . glipiZIDE (GLUCOTROL) 10 MG tablet Take 10 mg by mouth 2 (two) times daily before a meal.   . isosorbide mononitrate (IMDUR) 60 MG 24 hr tablet Take 1 tablet (60 mg total) by mouth daily.  Marland Kitchen JANUVIA 100 MG tablet TAKE ONE TABLET BY MOUTH DAILY FOR diabetes  . montelukast (SINGULAIR) 10 MG tablet Take 10 mg by mouth daily as needed for cough.  . Multiple Vitamin (MULTIVITAMIN PO) Take 1 tablet by mouth daily.   Marland Kitchen NITROSTAT 0.4 MG SL tablet Place 0.4 mg under  the tongue every 5 (five) minutes as needed.   . potassium chloride (K-DUR) 10 MEQ tablet Take 1 tablet (10 mEq total) 2 (two) times daily by mouth.  . promethazine (PHENERGAN) 25 MG tablet Take 1 tablet by mouth daily as needed for nausea.  . ramipril (ALTACE) 10 MG capsule Take 10 mg by mouth daily.    Marland Kitchen RANEXA 500 MG 12 hr tablet Take 1 tablet (500 mg total) by mouth every 12 (twelve) hours.  . ranitidine (ZANTAC) 300 MG tablet Take 300 mg by mouth at bedtime.   . Vitamin D, Ergocalciferol, (DRISDOL) 50000 units CAPS capsule Take 1 capsule by mouth once a week.     Allergies:   Codeine; Ivp dye [iodinated diagnostic agents]; Metformin; Metrizamide; and Pioglitazone   Social History   Socioeconomic History  . Marital status: Married    Spouse name: Not on file  . Number of children: Not on file  . Years of education: Not on file  . Highest education level: Not on file  Occupational  History  . Not on file  Social Needs  . Financial resource strain: Not on file  . Food insecurity:    Worry: Not on file    Inability: Not on file  . Transportation needs:    Medical: Not on file    Non-medical: Not on file  Tobacco Use  . Smoking status: Never Smoker  . Smokeless tobacco: Never Used  Substance and Sexual Activity  . Alcohol use: No    Alcohol/week: 0.0 oz  . Drug use: No  . Sexual activity: Not on file  Lifestyle  . Physical activity:    Days per week: Not on file    Minutes per session: Not on file  . Stress: Not on file  Relationships  . Social connections:    Talks on phone: Not on file    Gets together: Not on file    Attends religious service: Not on file    Active member of club or organization: Not on file    Attends meetings of clubs or organizations: Not on file    Relationship status: Not on file  Other Topics Concern  . Not on file  Social History Narrative  . Not on file     Family History: The patient's family history includes Alzheimer's disease in  her mother; Arthritis in her mother and sister; Bladder Cancer in her father; Heart attack in her maternal grandfather and maternal grandmother; Hyperlipidemia in her father and mother; Vitiligo in her sister. ROS:   Please see the history of present illness.    All other systems reviewed and are negative.  EKGs/Labs/Other Studies Reviewed:    The following studies were reviewed today:   Recent Labs: Recent CMP normal potassium 4.4 recent lipid profile cholesterol 217 triglycerides 556 and she is pending recheck with her PCP No results found for requested labs within last 8760 hours.  Recent Lipid Panel No results found for: CHOL, TRIG, HDL, CHOLHDL, VLDL, LDLCALC, LDLDIRECT  Physical Exam:    VS:  BP (!) 114/58 (BP Location: Right Arm, Patient Position: Sitting, Cuff Size: Normal)   Pulse 62   Ht 5\' 2"  (1.575 m)   Wt 124 lb 1.9 oz (56.3 kg)   SpO2 96%   BMI 22.70 kg/m     Wt Readings from Last 3 Encounters:  07/30/17 124 lb 1.9 oz (56.3 kg)  05/01/17 124 lb (56.2 kg)  10/23/16 119 lb 6.4 oz (54.2 kg)     GEN:  Well nourished, well developed in no acute distress HEENT: Normal NECK: No JVD; No carotid bruits LYMPHATICS: No lymphadenopathy CARDIAC: RRR, no murmurs, rubs, gallops RESPIRATORY:  Clear to auscultation without rales, wheezing or rhonchi  ABDOMEN: Soft, non-tender, non-distended MUSCULOSKELETAL:  No edema; No deformity  SKIN: Warm and dry NEUROLOGIC:  Alert and oriented x 3 PSYCHIATRIC:  Normal affect    Signed, 10/25/16, MD  07/30/2017 12:51 PM    [facility]

## 2017-07-30 ENCOUNTER — Encounter: Payer: Self-pay | Admitting: Cardiology

## 2017-07-30 ENCOUNTER — Ambulatory Visit: Payer: Medicare Other | Admitting: Cardiology

## 2017-07-30 VITALS — BP 114/58 | HR 62 | Ht 62.0 in | Wt 124.1 lb

## 2017-07-30 DIAGNOSIS — Z95810 Presence of automatic (implantable) cardiac defibrillator: Secondary | ICD-10-CM | POA: Diagnosis not present

## 2017-07-30 DIAGNOSIS — I5042 Chronic combined systolic (congestive) and diastolic (congestive) heart failure: Secondary | ICD-10-CM

## 2017-07-30 DIAGNOSIS — I130 Hypertensive heart and chronic kidney disease with heart failure and stage 1 through stage 4 chronic kidney disease, or unspecified chronic kidney disease: Secondary | ICD-10-CM

## 2017-07-30 DIAGNOSIS — I2510 Atherosclerotic heart disease of native coronary artery without angina pectoris: Secondary | ICD-10-CM

## 2017-07-30 DIAGNOSIS — N183 Chronic kidney disease, stage 3 unspecified: Secondary | ICD-10-CM

## 2017-07-30 DIAGNOSIS — E7800 Pure hypercholesterolemia, unspecified: Secondary | ICD-10-CM

## 2017-07-30 NOTE — Patient Instructions (Signed)

## 2017-08-07 ENCOUNTER — Encounter: Payer: Self-pay | Admitting: Family

## 2017-08-07 ENCOUNTER — Ambulatory Visit (HOSPITAL_COMMUNITY)
Admission: RE | Admit: 2017-08-07 | Discharge: 2017-08-07 | Disposition: A | Discharge status: Home / Self Care | Payer: Medicare Other | Source: Ambulatory Visit | Attending: Family | Admitting: Family

## 2017-08-07 ENCOUNTER — Other Ambulatory Visit: Payer: Self-pay

## 2017-08-07 ENCOUNTER — Ambulatory Visit (INDEPENDENT_AMBULATORY_CARE_PROVIDER_SITE_OTHER): Payer: Medicare Other | Admitting: Family

## 2017-08-07 VITALS — BP 117/65 | HR 68 | Temp 97.3°F | Resp 16 | Ht 62.0 in | Wt 125.0 lb

## 2017-08-07 DIAGNOSIS — I739 Peripheral vascular disease, unspecified: Secondary | ICD-10-CM

## 2017-08-07 DIAGNOSIS — I7409 Other arterial embolism and thrombosis of abdominal aorta: Secondary | ICD-10-CM | POA: Diagnosis present

## 2017-08-07 DIAGNOSIS — Z959 Presence of cardiac and vascular implant and graft, unspecified: Secondary | ICD-10-CM

## 2017-08-07 DIAGNOSIS — E1151 Type 2 diabetes mellitus with diabetic peripheral angiopathy without gangrene: Secondary | ICD-10-CM

## 2017-08-07 NOTE — Progress Notes (Signed)
[facility] [place]   CC: Follow up peripheral artery occlusive disease   History of Present Illness Shari [name] is a 83 y.o. female patient of Dr. Hart Rochester who is status post insertion of aortic stent and left common iliac stent by Dr. Imogene Burn in August of 2016. This was done for life limiting claudication of both legs.   Dr. Hart Rochester had seen the patient with bilateral lower extremity claudication symptoms and suspected aortic and iliac disease. She states that after the procedure she was completely free of claudication symptoms in both legs. There had been some question about whether she had neurogenic claudication and that has now been ruled out.  She had right hand surgery in August 2017 for carpal tunnel syndrome.  Her walking does not seem to be limited by her arthritis pain, she plans to walk more when the weather gets warmer.  She is seeing a rheumatologist.  She sees Dr. Dulce Sellar in [place] for CHF, has a Facilities manager.   She had a TIA in 2013. She states that Dr. Dulce Sellar requested a carotid duplex, performed at [facility], that showed no blockages (note that no carotid duplex results on file in [facility]).   Pt states she has problems with recurrent constipation and gas.  More recently, after walking about 1/2 mile, both legs feel tired.  She is walking 30 minutes total, at least 3 days/week.   Diabetic: Yes, pt states last A1C was 6.8 Tobacco use: non-smoker  Pt meds include:  Statin :Yes Betablocker: Yes ASA: No Takes Plavix  Past Medical History:  Diagnosis Date  . Anxiety   . Automatic implantable cardioverter-defibrillator in situ 10/25/2014   Overview:  Overview:  dual chamber ICD,upgraded to CRT with LBBB and nonischemic cardiomyopathy  . Biventricular ICD (implantable cardioverter-defibrillator) in place 10/25/2014   Overview:  BIV -ICD,upgraded to CRT with LBBB and nonischemic cardiomyopathy  . Chronic  coronary artery disease 10/25/2014  . Costochondritis 10/25/2014  . Diabetes mellitus 2 years  . Diverticulitis   . Elevated troponin 06/04/2016   Last Assessment & Plan:  Likely may be related to ventricular pacing and underlying cardiomyopathy.  The trajectory of the biomarker elevation and neither is her clinical history suggestive of acute coronary syndrome.  Further stratification with nuclear stress testing  . GERD (gastroesophageal reflux disease)   . Glaucoma   . High cholesterol   . Hypertension   . Hypertensive heart disease with heart failure (HCC) 10/25/2014  . Hypokalemia 06/04/2016   Last Assessment & Plan:  Replace with potassium chloride.  . IBS (irritable bowel syndrome)   . 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 Mild CAD 10/25/2014  . Onychomycosis 11/10/2010  . Osteoarthritis   . PAD (peripheral artery disease) (HCC) 11/03/2014   Overview:  status post insertion of aortic stent and left common iliac stent by Dr. Imogene Burn in August of 2016  . Peripheral arterial occlusive disease (HCC) 11/03/2014  . Precordial chest pain 06/04/2016   Last Assessment & Plan:  My suspicion is that this is likely related to gastroesophageal reflux disease with acute indigestion last evening.  The biomarker elevation is not completely unexpected in this elderly patient with reported cardiomyopathy and current demand ventricular pacing.  Nonetheless she does have a history of extensive vascular disease (though apparently no significant coronary atherosclerosis in 2013) so we will proceed with a noninvasive risk stratification with pharmacological nuclear stress testing.  Assuming this is a low risk or normal  study then I would not advise any invasive cardiac evaluation at this time.  . Preoperative cardiovascular examination 04/02/2015  . Rheumatoid arthritis involving multiple sites (HCC) 05/21/2015  . Stroke (HCC)   . Vitamin D deficiency 06/20/2016    Social History Social  History   Tobacco Use  . Smoking status: Never Smoker  . Smokeless tobacco: Never Used  Substance Use Topics  . Alcohol use: No    Alcohol/week: 0.0 oz  . Drug use: No    Family History Family History  Problem Relation Age of Onset  . Heart attack Maternal Grandmother   . Heart attack Maternal Grandfather   . Alzheimer's disease Mother   . Hyperlipidemia Mother   . Arthritis Mother   . Bladder Cancer Father   . Hyperlipidemia Father   . Vitiligo Sister   . Arthritis Sister     Past Surgical History:  Procedure Laterality Date  . FOOT SURGERY    . pacemaker surgery     a-fib and was done july 2014  . PERIPHERAL VASCULAR CATHETERIZATION N/A 11/05/2014   Procedure: Abdominal Aortogram;  Surgeon: Fransisco Hertz, MD;  Location: [facility] INVASIVE CV LAB;  Service: Cardiovascular;  Laterality: N/A;    Allergies  Allergen Reactions  . Codeine Diarrhea, Nausea And Vomiting, Other (See Comments) and Hives    All-over body aches, too  . Ivp Dye [Iodinated Diagnostic Agents] Nausea And Vomiting and Other (See Comments)  . Metformin Other (See Comments)  . Metrizamide Nausea And Vomiting and Other (See Comments)  . Pioglitazone Other (See Comments)    Has CHF    Current Outpatient Medications  Medication Sig Dispense Refill  . Acetaminophen (TYLENOL ARTHRITIS PAIN PO) Take 2 tablets by mouth 2 (two) times daily. Reported on 07/07/2015    . ALPRAZolam (XANAX) 0.25 MG tablet Take 0.25 mg by mouth daily as needed for anxiety.    . Ascorbic Acid (VITAMIN C) 1000 MG tablet Take 1,000 mg by mouth daily.    Marland Kitchen atorvastatin (LIPITOR) 10 MG tablet Take 1 tablet (10 mg total) by mouth at bedtime. 90 tablet 1  . Calcium Carbonate-Vit D-Min (CALCIUM 1200 PO) Take 1 tablet by mouth daily.     . carvedilol (COREG) 25 MG tablet Take 25 mg by mouth 2 (two) times daily with a meal.      . clopidogrel (PLAVIX) 75 MG tablet Take 1 tablet (75 mg total) by mouth daily. 90 tablet 3  . DEXILANT 60 MG capsule  Take 60 mg by mouth daily.     . furosemide (LASIX) 80 MG tablet TAKE TWO TABLETS BY MOUTH TWICE DAILY    . glipiZIDE (GLUCOTROL) 10 MG tablet Take 10 mg by mouth 2 (two) times daily before a meal.     . isosorbide mononitrate (IMDUR) 60 MG 24 hr tablet Take 1 tablet (60 mg total) by mouth daily. 90 tablet 2  . JANUVIA 100 MG tablet TAKE ONE TABLET BY MOUTH DAILY FOR diabetes  3  . montelukast (SINGULAIR) 10 MG tablet Take 10 mg by mouth daily as needed for cough.    . Multiple Vitamin (MULTIVITAMIN PO) Take 1 tablet by mouth daily.     Marland Kitchen NITROSTAT 0.4 MG SL tablet Place 0.4 mg under the tongue every 5 (five) minutes as needed.     . potassium chloride (K-DUR) 10 MEQ tablet Take 1 tablet (10 mEq total) 2 (two) times daily by mouth. 180 tablet 2  . ramipril (ALTACE) 10 MG capsule Take  10 mg by mouth daily.      Marland Kitchen RANEXA 500 MG 12 hr tablet Take 1 tablet (500 mg total) by mouth every 12 (twelve) hours. 180 tablet 2  . ranitidine (ZANTAC) 300 MG tablet Take 300 mg by mouth at bedtime.     . Vitamin D, Ergocalciferol, (DRISDOL) 50000 units CAPS capsule Take 1 capsule by mouth once a week.     No current facility-administered medications for this visit.     ROS: See HPI for pertinent positives and negatives.   Physical Examination  Vitals:   08/07/17 1057 08/07/17 1058  BP: (!) 113/56 117/65  Pulse: 68   Resp: 16   Temp: (!) 97.3 F (36.3 C)   TempSrc: Oral   SpO2: 95%   Weight: 125 lb (56.7 kg)   Height: 5\' 2"  (1.575 m)    Body mass index is 22.86 kg/m.  General: A&O x 3, WDWN female. Gait: normal HENT: No gross abnormalities  Eyes: PERRLA. Pulmonary: Respirations are non labored, CTAB, good air movement in all fields.  Cardiac: regular rhythm, no detected murmur.Pacemaker/defrillator subcutaneous left upper chest.    Carotid Bruits Right Left   Negative Negative   Abdominal aortic pulse is not palpable. Radial pulses: 2+ palpable and  =   VASCULAR EXAM: Extremitiesno ischemic changes,no Gangrene; no open wounds.     LE Pulses Right Left   FEMORAL 2+ palpable 2+ palpable    POPLITEAL not palpable  not palpable   POSTERIOR TIBIAL 2+ palpable  1+ palpable    DORSALIS PEDIS  ANTERIOR TIBIAL 2+palpable  1+ palpable    Abdomen: soft, NT, no palpable masses. Skin: no rashes, no cellulitis, no ulcers. Musculoskeletal: no muscle wasting or atrophy.Arthritic changes in hands and feet. Neurologic: A&O X 3; Appropriate Affect, MOTOR FUNCTION: moving all extremities equally, motor strength 4/5 throughout. Speech is fluent/normal. CN 2-12 intact except for considerable hearing loss. Psychiatric: Thought content is normal, mood appropriate for clinical situation.    ASSESSMENT: Shari [name] is a 83 y.o. female who is status post insertion of aortic stent and left common iliac stent by Dr. Imogene Burn in August of 2016.  Dr. Hart Rochester had seen the patient with bilateral lower extremity claudication symptoms and suspected aortic and iliac disease. After the procedure she was completely free of claudication symptoms in both legs. There had been some question about whether she had neurogenic claudication and that has now been ruled out.  More recently both legs feel tired after walking about 1/2 mile, but she admits to not walking as much over the colder months.  She has no signs of ischemia in either lower extremity.  Fortunately she has never used tobacco. Her primary atherosclerotic risk factor is her now controlled DM, had been uncontrolled.  She takes a daily statin and Plavix.     DATA  Left Aortoiliac Duplex (08/07/17): Aortic stent could not be precisely identified, however, PSV within the  aorta increase from 92 cm/s to 263 cm/s, c/w >50% stenosis, all biphasic waveforms. Left CIA stent: no stenosis, tri and biphasic waveforms.    ABI (Date: 08/07/2017):  R:   ABI: 0.87 (was 1.13 on 05-10-16),   PT: tri  DP: tri  TBI:  0.39 (was 0.86)  L:   ABI: 0.94 (was 1.12),   PT: tri  DP: tri  TBI: 0.68 (was 0.82)  Decline in bilateral ABI and TBI, waveforms remain triphasic, mild disease bilaterally.     PLAN:  Graduated walking program discussed  and how to achieve safely.  Based on the patient's vascular studies and examination, pt will return to clinic in 1 year with ABI's and left aortoiliac duplex. I advised her to notify us if she develops concerns re the circulation in her feet or legs.    I discussed in depth with the patient the nature of atherosclerosis, and emphasized the importance of maximal medical management including strict control of blood pressure, blood glucose, and lipid levels, obtaining regular exercise, and continued cessation of smoking.  The patient is aware that without maximal medical management the underlying atherosclerotic disease process will progress, limiting the benefit of any interventions.  The patient was given information about PAD including signs, symptoms, treatment, what symptoms should prompt the patient to seek immediate medical care, and risk reduction measures to take.   Charisse March, RN, MSN, FNP-C [facility] Phone: [phone]  Clinic MD: Early/Fields  08/07/17 12:04 PM

## 2017-08-07 NOTE — Patient Instructions (Signed)

## 2017-08-21 ENCOUNTER — Other Ambulatory Visit: Payer: Self-pay | Admitting: *Deleted

## 2017-08-21 DIAGNOSIS — I739 Peripheral vascular disease, unspecified: Secondary | ICD-10-CM

## 2017-08-21 MED ORDER — CLOPIDOGREL BISULFATE 75 MG PO TABS: 75.0000 mg | ORAL_TABLET | Freq: Every day | ORAL | 3 refills | Status: DC

## 2017-09-03 ENCOUNTER — Other Ambulatory Visit: Payer: Self-pay

## 2017-09-03 MED ORDER — NITROGLYCERIN 0.4 MG SL SUBL: 0.4000 mg | SUBLINGUAL_TABLET | SUBLINGUAL | 11 refills | Status: DC | PRN

## 2017-10-01 ENCOUNTER — Other Ambulatory Visit: Payer: Self-pay

## 2017-10-01 MED ORDER — ATORVASTATIN CALCIUM 10 MG PO TABS: 10.0000 mg | ORAL_TABLET | Freq: Every day | ORAL | 1 refills | Status: DC

## 2017-10-22 ENCOUNTER — Ambulatory Visit (INDEPENDENT_AMBULATORY_CARE_PROVIDER_SITE_OTHER): Payer: Medicare Other | Admitting: *Deleted

## 2017-10-22 DIAGNOSIS — I428 Other cardiomyopathies: Secondary | ICD-10-CM

## 2017-10-22 NOTE — Progress Notes (Signed)
Remote ICD transmission.   

## 2017-10-23 ENCOUNTER — Encounter: Payer: Self-pay | Admitting: Cardiology

## 2017-10-23 NOTE — Progress Notes (Signed)
Letter  

## 2017-10-25 ENCOUNTER — Ambulatory Visit: Payer: Medicare Other | Admitting: Sports Medicine

## 2017-10-28 NOTE — Progress Notes (Signed)
Electrophysiology Office Note   Date:  10/28/2017   ID:  [name], [name] 1935-01-16, MRN [id number]  PCP:  Olive Bass, MD  Cardiologist:  Dulce Sellar Primary Electrophysiologist:  Samie Barclift [name], MD    No chief complaint on file.    History of Present Illness: Shari [name] is a 83 y.o. female who is being seen today for the evaluation of CHF at the request of [name], Doris Cheadle, MD. Presenting today for electrophysiology evaluation. She has a history of hypertensive heart disease, mild coronary artery disease, chronic combined systolic and diastolic heart failure, hyperlipidemia.  She has a BiV ICD in place.  Today, denies symptoms of palpitations, chest pain, PND, lower extremity edema, claudication, dizziness, presyncope, syncope, bleeding, or neurologic sequela. The patient is tolerating medications without difficulties.  Overall she is doing well.  She is short of breath at baseline.  She is able to do most of her daily activities but at times has to stop to catch her breath.  She also gets short of breath at night.  This is been a constant thing.  She does take twice daily Lasix.   Past Medical History:  Diagnosis Date  . Anxiety   . Automatic implantable cardioverter-defibrillator in situ 10/25/2014   Overview:  Overview:  dual chamber ICD,upgraded to CRT with LBBB and nonischemic cardiomyopathy  . Biventricular ICD (implantable cardioverter-defibrillator) in place 10/25/2014   Overview:  BIV -ICD,upgraded to CRT with LBBB and nonischemic cardiomyopathy  . Chronic coronary artery disease 10/25/2014  . Costochondritis 10/25/2014  . Diabetes mellitus 2 years  . Diverticulitis   . Elevated troponin 06/04/2016   Last Assessment & Plan:  Likely may be related to ventricular pacing and underlying cardiomyopathy.  The trajectory of the biomarker elevation and neither is her clinical history suggestive of acute coronary syndrome.  Further stratification with nuclear stress testing  .  GERD (gastroesophageal reflux disease)   . Glaucoma   . High cholesterol   . Hypertension   . Hypertensive heart disease with heart failure (HCC) 10/25/2014  . Hypokalemia 06/04/2016   Last Assessment & Plan:  Replace with potassium chloride.  . IBS (irritable bowel syndrome)   . 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 Mild CAD 10/25/2014  . Onychomycosis 11/10/2010  . Osteoarthritis   . PAD (peripheral artery disease) (HCC) 11/03/2014   Overview:  status post insertion of aortic stent and left common iliac stent by Dr. Imogene Burn in August of 2016  . Peripheral arterial occlusive disease (HCC) 11/03/2014  . Precordial chest pain 06/04/2016   Last Assessment & Plan:  My suspicion is that this is likely related to gastroesophageal reflux disease with acute indigestion last evening.  The biomarker elevation is not completely unexpected in this elderly patient with reported cardiomyopathy and current demand ventricular pacing.  Nonetheless she does have a history of extensive vascular disease (though apparently no significant coronary atherosclerosis in 2013) so we Gricel Copen proceed with a noninvasive risk stratification with pharmacological nuclear stress testing.  Assuming this is a low risk or normal study then I would not advise any invasive cardiac evaluation at this time.  . Preoperative cardiovascular examination 04/02/2015  . Rheumatoid arthritis involving multiple sites (HCC) 05/21/2015  . Stroke (HCC)   . Vitamin D deficiency 06/20/2016   Past Surgical History:  Procedure Laterality Date  . FOOT SURGERY    . pacemaker surgery     a-fib and was done july 2014  . PERIPHERAL  VASCULAR CATHETERIZATION N/A 11/05/2014   Procedure: Abdominal Aortogram;  Surgeon: Fransisco Hertz, MD;  Location: [facility] INVASIVE CV LAB;  Service: Cardiovascular;  Laterality: N/A;     Current Outpatient Medications  Medication Sig Dispense Refill  . Acetaminophen (TYLENOL ARTHRITIS PAIN PO) Take 2 tablets  by mouth 2 (two) times daily. Reported on 07/07/2015    . ALPRAZolam (XANAX) 0.25 MG tablet Take 0.25 mg by mouth daily as needed for anxiety.    . Ascorbic Acid (VITAMIN C) 1000 MG tablet Take 1,000 mg by mouth daily.    Marland Kitchen atorvastatin (LIPITOR) 10 MG tablet Take 1 tablet (10 mg total) by mouth at bedtime. 90 tablet 1  . Calcium Carbonate-Vit D-Min (CALCIUM 1200 PO) Take 1 tablet by mouth daily.     . carvedilol (COREG) 25 MG tablet Take 25 mg by mouth 2 (two) times daily with a meal.      . clopidogrel (PLAVIX) 75 MG tablet Take 1 tablet (75 mg total) by mouth daily. 90 tablet 3  . DEXILANT 60 MG capsule Take 60 mg by mouth daily.     . furosemide (LASIX) 80 MG tablet TAKE TWO TABLETS BY MOUTH TWICE DAILY    . glipiZIDE (GLUCOTROL) 10 MG tablet Take 10 mg by mouth 2 (two) times daily before a meal.     . isosorbide mononitrate (IMDUR) 60 MG 24 hr tablet Take 1 tablet (60 mg total) by mouth daily. 90 tablet 2  . JANUVIA 100 MG tablet TAKE ONE TABLET BY MOUTH DAILY FOR diabetes  3  . montelukast (SINGULAIR) 10 MG tablet Take 10 mg by mouth daily as needed for cough.    . Multiple Vitamin (MULTIVITAMIN PO) Take 1 tablet by mouth daily.     . nitroGLYCERIN (NITROSTAT) 0.4 MG SL tablet Place 1 tablet (0.4 mg total) under the tongue every 5 (five) minutes as needed. 25 tablet 11  . potassium chloride (K-DUR) 10 MEQ tablet Take 1 tablet (10 mEq total) 2 (two) times daily by mouth. 180 tablet 2  . ramipril (ALTACE) 10 MG capsule Take 10 mg by mouth daily.      Marland Kitchen RANEXA 500 MG 12 hr tablet Take 1 tablet (500 mg total) by mouth every 12 (twelve) hours. 180 tablet 2  . ranitidine (ZANTAC) 300 MG tablet Take 300 mg by mouth at bedtime.     . Vitamin D, Ergocalciferol, (DRISDOL) 50000 units CAPS capsule Take 1 capsule by mouth once a week.     No current facility-administered medications for this visit.     Allergies:   Codeine; Ivp dye [iodinated diagnostic agents]; Metformin; Metrizamide; and  Pioglitazone   Social History:  The patient  reports that she has never smoked. She has never used smokeless tobacco. She reports that she does not drink alcohol or use drugs.   Family History:  The patient's family history includes Alzheimer's disease in her mother; Arthritis in her mother and sister; Bladder Cancer in her father; Heart attack in her maternal grandfather and maternal grandmother; Hyperlipidemia in her father and mother; Vitiligo in her sister.    ROS:  Please see the history of present illness.   Otherwise, review of systems is positive for shortness of breath, hearing loss, visual changes, and cough.   All other systems are reviewed and negative.   PHYSICAL EXAM: VS:  There were no vitals taken for this visit. , BMI There is no height or weight on file to calculate BMI. GEN: Well nourished, well  developed, in no acute distress  HEENT: normal  Neck: no JVD, carotid bruits, or masses Cardiac: RRR; no murmurs, rubs, or gallops,no edema  Respiratory:  clear to auscultation bilaterally, normal work of breathing GI: soft, nontender, nondistended, + BS MS: no deformity or atrophy  Skin: warm and dry, device site well healed Neuro:  Strength and sensation are intact Psych: euthymic mood, full affect  EKG:  EKG is ordered today. Personal review of the ekg ordered shows atrial sensed, ventricular paced  Personal review of the device interrogation today. Results in Paceart  Recent Labs: No results found for requested labs within last 8760 hours.    Lipid Panel  No results found for: CHOL, TRIG, HDL, CHOLHDL, VLDL, LDLCALC, LDLDIRECT   Wt Readings from Last 3 Encounters:  08/07/17 125 lb (56.7 kg)  07/30/17 124 lb 1.9 oz (56.3 kg)  05/01/17 124 lb (56.2 kg)      Other studies Reviewed: Additional studies/ records that were reviewed today include: Epic notes, SPECT 2018  Review of the above records today demonstrates:   Left ventricular wall motion: There is normal  contractility of all wall segments. Left ventricular ejection fraction: 77%. Right ventricular wall motion: Normal   ASSESSMENT AND PLAN:  1.  Chronic systolic and diastolic heart failure: Status post Saint Jude by V ICD.  She is on good medical therapy with carvedilol and ramipril.  She does have some shortness of breath and thus in the future may benefit from a switch to [facility] should her shortness of breath continue.  This may help out with some of her heart failure symptoms.  In review of her device interrogation, she does not have excess volume.  No changes at this time.    2. Hypertension: Well-controlled.  No changes.  3. Hyperlipidemia:  continue atorvastatin  4. Coronary artery disease: Currently on aspirin and Plavix.  No changes.  Current medicines are reviewed at length with the patient today.   The patient does not have concerns regarding her medicines.  The following changes were made today: None  Labs/ tests ordered today include:  No orders of the defined types were placed in this encounter.    Disposition:   FU with Tylesha Gibeault 1 year  Signed, Delante Karapetyan [name], MD  10/28/2017 1:27 PM     [facility] [address] [place] [address] [phone] (office) [phone] (fax)

## 2017-10-29 ENCOUNTER — Encounter: Payer: Self-pay | Admitting: Cardiology

## 2017-10-29 ENCOUNTER — Ambulatory Visit: Payer: Medicare Other | Admitting: Cardiology

## 2017-10-29 VITALS — BP 110/58 | HR 68 | Ht 62.0 in | Wt 127.2 lb

## 2017-10-29 DIAGNOSIS — I10 Essential (primary) hypertension: Secondary | ICD-10-CM

## 2017-10-29 DIAGNOSIS — I5022 Chronic systolic (congestive) heart failure: Secondary | ICD-10-CM

## 2017-10-29 DIAGNOSIS — E785 Hyperlipidemia, unspecified: Secondary | ICD-10-CM | POA: Diagnosis not present

## 2017-10-29 DIAGNOSIS — I2510 Atherosclerotic heart disease of native coronary artery without angina pectoris: Secondary | ICD-10-CM | POA: Diagnosis not present

## 2017-10-29 NOTE — Patient Instructions (Signed)
Medication Instructions:  Your physician recommends that you continue on your current medications as directed. Please refer to the Current Medication list given to you today.  *If you need a refill on your cardiac medications before your next appointment, please call your pharmacy*  Labwork: None ordered  Testing/Procedures: None ordered  Follow-Up: Remote monitoring is used to monitor your Pacemaker or ICD from home. This monitoring reduces the number of office visits required to check your device to one time per year. It allows [place] to keep an eye on the functioning of your device to ensure it is working properly. You are scheduled for a device check from home on 01/21/2018. You may send your transmission at any time that day. If you have a wireless device, the transmission will be sent automatically. After your physician reviews your transmission, you will receive a postcard with your next transmission date.  Your physician wants you to follow-up in: 1 year with Dr. Elberta Fortis.  You will receive a reminder letter in the mail two months in advance. If you don't receive a letter, please call our office to schedule the follow-up appointment.  Thank you for choosing [facility]!!   Dory Horn, RN [phone]

## 2017-11-07 LAB — CUP PACEART INCLINIC DEVICE CHECK
Brady Statistic RV Percent Paced: 99.97 %
Date Time Interrogation Session: 20190729144640
HIGH POWER IMPEDANCE MEASURED VALUE: 65.25 Ohm
Implantable Lead Implant Date: 20140702
Implantable Lead Implant Date: 20140702
Implantable Lead Implant Date: 20150730
Implantable Lead Location: 753858
Lead Channel Impedance Value: 475 Ohm
Lead Channel Pacing Threshold Amplitude: 0.75 V
Lead Channel Pacing Threshold Amplitude: 1 V
Lead Channel Pacing Threshold Amplitude: 2.5 V
Lead Channel Pacing Threshold Amplitude: 2.5 V
Lead Channel Pacing Threshold Pulse Width: 0.5 ms
Lead Channel Pacing Threshold Pulse Width: 1 ms
Lead Channel Pacing Threshold Pulse Width: 1 ms
Lead Channel Sensing Intrinsic Amplitude: 11.7 mV
Lead Channel Setting Pacing Amplitude: 2 V
Lead Channel Setting Pacing Pulse Width: 1 ms
MDC IDC LEAD LOCATION: 753859
MDC IDC LEAD LOCATION: 753860
MDC IDC MSMT BATTERY REMAINING LONGEVITY: 15 mo
MDC IDC MSMT LEADCHNL LV IMPEDANCE VALUE: 537.5 Ohm
MDC IDC MSMT LEADCHNL RA PACING THRESHOLD AMPLITUDE: 0.75 V
MDC IDC MSMT LEADCHNL RA PACING THRESHOLD PULSEWIDTH: 0.5 ms
MDC IDC MSMT LEADCHNL RA SENSING INTR AMPL: 2.4 mV
MDC IDC MSMT LEADCHNL RV IMPEDANCE VALUE: 337.5 Ohm
MDC IDC MSMT LEADCHNL RV PACING THRESHOLD AMPLITUDE: 1 V
MDC IDC MSMT LEADCHNL RV PACING THRESHOLD PULSEWIDTH: 0.5 ms
MDC IDC MSMT LEADCHNL RV PACING THRESHOLD PULSEWIDTH: 0.5 ms
MDC IDC PG IMPLANT DT: 20150730
MDC IDC SET LEADCHNL LV PACING AMPLITUDE: 3 V
MDC IDC SET LEADCHNL RV PACING AMPLITUDE: 2 V
MDC IDC SET LEADCHNL RV PACING PULSEWIDTH: 0.5 ms
MDC IDC SET LEADCHNL RV SENSING SENSITIVITY: 0.5 mV
MDC IDC STAT BRADY RA PERCENT PACED: 0.41 %
Pulse Gen Serial Number: 7147055

## 2017-11-19 DIAGNOSIS — K625 Hemorrhage of anus and rectum: Secondary | ICD-10-CM

## 2017-11-19 HISTORY — DX: Hemorrhage of anus and rectum: K62.5

## 2017-11-27 ENCOUNTER — Telehealth: Payer: Self-pay | Admitting: Cardiology

## 2017-11-27 NOTE — Telephone Encounter (Signed)
Spoke w/ pt and requested that she send a manual transmission b/c her home monitor has not updated in at least 7 days.   

## 2017-11-28 LAB — CUP PACEART REMOTE DEVICE CHECK
Battery Remaining Percentage: 27 %
Battery Voltage: 2.87 V
Brady Statistic AP VS Percent: 1 %
Brady Statistic AS VP Percent: 99 %
Brady Statistic RA Percent Paced: 1 %
Date Time Interrogation Session: 20190722080023
HighPow Impedance: 64 Ohm
HighPow Impedance: 64 Ohm
Implantable Lead Implant Date: 20140702
Implantable Lead Implant Date: 20150730
Implantable Lead Location: 753859
Implantable Lead Location: 753860
Implantable Lead Model: 7122
Lead Channel Impedance Value: 330 Ohm
Lead Channel Impedance Value: 460 Ohm
Lead Channel Impedance Value: 480 Ohm
Lead Channel Pacing Threshold Amplitude: 0.75 V
Lead Channel Pacing Threshold Amplitude: 0.75 V
Lead Channel Pacing Threshold Amplitude: 2.5 V
Lead Channel Pacing Threshold Pulse Width: 0.5 ms
Lead Channel Pacing Threshold Pulse Width: 1 ms
Lead Channel Sensing Intrinsic Amplitude: 11.7 mV
Lead Channel Setting Pacing Amplitude: 2 V
Lead Channel Setting Pacing Pulse Width: 0.5 ms
Lead Channel Setting Pacing Pulse Width: 1 ms
MDC IDC LEAD IMPLANT DT: 20140702
MDC IDC LEAD LOCATION: 753858
MDC IDC MSMT BATTERY REMAINING LONGEVITY: 16 mo
MDC IDC MSMT LEADCHNL RA PACING THRESHOLD PULSEWIDTH: 0.5 ms
MDC IDC MSMT LEADCHNL RA SENSING INTR AMPL: 2.1 mV
MDC IDC PG IMPLANT DT: 20150730
MDC IDC PG SERIAL: 7147055
MDC IDC SET LEADCHNL LV PACING AMPLITUDE: 3 V
MDC IDC SET LEADCHNL RV PACING AMPLITUDE: 2 V
MDC IDC SET LEADCHNL RV SENSING SENSITIVITY: 0.5 mV
MDC IDC STAT BRADY AP VP PERCENT: 1 %
MDC IDC STAT BRADY AS VS PERCENT: 1 %

## 2018-01-21 ENCOUNTER — Ambulatory Visit (INDEPENDENT_AMBULATORY_CARE_PROVIDER_SITE_OTHER): Payer: Medicare Other | Admitting: *Deleted

## 2018-01-21 DIAGNOSIS — I428 Other cardiomyopathies: Secondary | ICD-10-CM | POA: Diagnosis not present

## 2018-01-21 NOTE — Progress Notes (Signed)
Remote ICD transmission.   

## 2018-01-22 ENCOUNTER — Other Ambulatory Visit: Payer: Self-pay | Admitting: Cardiology

## 2018-01-22 ENCOUNTER — Encounter: Payer: Self-pay | Admitting: Cardiology

## 2018-01-22 NOTE — Telephone Encounter (Signed)
Isosorbide mononitrate 60 mg refilled

## 2018-01-28 ENCOUNTER — Telehealth: Payer: Self-pay

## 2018-01-28 MED ORDER — RANEXA 500 MG PO TB12: 500.0000 mg | ORAL_TABLET | Freq: Two times a day (BID) | ORAL | 0 refills | Status: DC

## 2018-01-28 MED ORDER — POTASSIUM CHLORIDE ER 10 MEQ PO TBCR: 10.0000 meq | EXTENDED_RELEASE_TABLET | Freq: Two times a day (BID) | ORAL | 0 refills | Status: DC

## 2018-01-28 MED ORDER — FUROSEMIDE 80 MG PO TABS: 160.0000 mg | ORAL_TABLET | Freq: Two times a day (BID) | ORAL | 0 refills | Status: DC

## 2018-01-28 NOTE — Telephone Encounter (Signed)
Rx sent to pharmacy as requested.

## 2018-01-29 ENCOUNTER — Other Ambulatory Visit: Payer: Self-pay | Admitting: Cardiology

## 2018-03-01 NOTE — Progress Notes (Signed)
Cardiology Office Note:    Date:  03/04/2018   ID:  Shari [name], DOB 09-10-1934, MRN [id number]  PCP:  Olive Bass, MD  Cardiologist:  Norman Herrlich, MD    Referring MD: Olive Bass, MD    ASSESSMENT:    1. Chest pain in adult   2. Chronic combined systolic and diastolic heart failure (HCC)   3. Hypertensive heart and kidney disease with heart failure and with chronic kidney disease stage III (HCC)   4. Biventricular ICD (implantable cardioverter-defibrillator) in place   5. Hyperlipidemia, unspecified hyperlipidemia type   6. Anxiety    PLAN:    In order of problems listed above:  1. Symptoms are difficult to define it may be her typical costochondritis in the past we decided to become more frequent bothersome she undergone ischemia evaluation think she be best suited to cardiac CTA.  She has a prescription for nitroglycerin to use if needed 2. Stable compensated New York Heart Association class I she has no fluid overload she will continue her current treatment with loop diuretic beta-blocker ACE inhibitor.  Recent labs reviewed from her PCP office stable 3. Stable blood pressure target continue current treatment 4. Stable followed in our practice she is close to 100% ventricularly paced and has had no arrhythmia on device telemetry 5. Stable continue high intensity statin 6. Rarely uses Xanax I gave her a prescription for 30 tablets no refill as the symptoms have overlap with cardiac symptoms in the past causing difficulty.   Next appointment: 6 months   Medication Adjustments/Labs and Tests Ordered: Current medicines are reviewed at length with the patient today.  Concerns regarding medicines are outlined above.  No orders of the defined types were placed in this encounter.  Meds ordered this encounter  Medications  . ALPRAZolam (XANAX) 0.25 MG tablet    Sig: Take 1 tablet (0.25 mg total) by mouth daily as needed for anxiety.    Dispense:  30 tablet    Refill:   0    Chief Complaint  Patient presents with  . Follow-up  . Congestive Heart Failure  . Coronary Artery Disease    History of Present Illness:    Shari [name] is a 83 y.o. female with a hx of  LBBB with heart failure EF 77% 06/04/16, Dyslipidemia, HTN ,stage 3 CKD, PAD   and CRT-D  last seen 04/291/9. Compliance with diet, lifestyle and medications: Yes  Overall she is done well she notes that such as doing heavy housework she will have some sternal discomfort at times it is tight.  She has a background history of mild memory of mild atherosclerosis and coronary angiography years ago as well as costal chondritis.  We discussed an ischemia evaluation at this time she does not wish to pursue this his symptoms were infrequent mild and not disrupting her lifestyle.  Her heart failure is nicely compensated she has no edema orthopnea no palpitation or syncope and her ICD is approaching 1 year of expected generator life and she has home telemetry has been followed closely. Past Medical History:  Diagnosis Date  . Anxiety   . Automatic implantable cardioverter-defibrillator in situ 10/25/2014   Overview:  Overview:  dual chamber ICD,upgraded to CRT with LBBB and nonischemic cardiomyopathy  . Biventricular ICD (implantable cardioverter-defibrillator) in place 10/25/2014   Overview:  BIV -ICD,upgraded to CRT with LBBB and nonischemic cardiomyopathy  . Chronic coronary artery disease 10/25/2014  . Costochondritis 10/25/2014  . Diabetes mellitus  2 years  . Diverticulitis   . Elevated troponin 06/04/2016   Last Assessment & Plan:  Likely may be related to ventricular pacing and underlying cardiomyopathy.  The trajectory of the biomarker elevation and neither is her clinical history suggestive of acute coronary syndrome.  Further stratification with nuclear stress testing  . GERD (gastroesophageal reflux disease)   . Glaucoma   . High cholesterol   . Hypertension   . Hypertensive heart disease with  heart failure (HCC) 10/25/2014  . Hypokalemia 06/04/2016   Last Assessment & Plan:  Replace with potassium chloride.  . IBS (irritable bowel syndrome)   . 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 Mild CAD 10/25/2014  . Onychomycosis 11/10/2010  . Osteoarthritis   . PAD (peripheral artery disease) (HCC) 11/03/2014   Overview:  status post insertion of aortic stent and left common iliac stent by Dr. Imogene Burn in August of 2016  . Peripheral arterial occlusive disease (HCC) 11/03/2014  . Precordial chest pain 06/04/2016   Last Assessment & Plan:  My suspicion is that this is likely related to gastroesophageal reflux disease with acute indigestion last evening.  The biomarker elevation is not completely unexpected in this elderly patient with reported cardiomyopathy and current demand ventricular pacing.  Nonetheless she does have a history of extensive vascular disease (though apparently no significant coronary atherosclerosis in 2013) so we will proceed with a noninvasive risk stratification with pharmacological nuclear stress testing.  Assuming this is a low risk or normal study then I would not advise any invasive cardiac evaluation at this time.  . Preoperative cardiovascular examination 04/02/2015  . Rheumatoid arthritis involving multiple sites (HCC) 05/21/2015  . Stroke (HCC)   . Vitamin D deficiency 06/20/2016    Past Surgical History:  Procedure Laterality Date  . FOOT SURGERY    . pacemaker surgery     a-fib and was done july 2014  . PERIPHERAL VASCULAR CATHETERIZATION N/A 11/05/2014   Procedure: Abdominal Aortogram;  Surgeon: Fransisco Hertz, MD;  Location: [facility] INVASIVE CV LAB;  Service: Cardiovascular;  Laterality: N/A;    Current Medications: Current Meds  Medication Sig  . Acetaminophen (TYLENOL ARTHRITIS PAIN PO) Take 2 tablets by mouth 2 (two) times daily. Reported on 07/07/2015  . ALPRAZolam (XANAX) 0.25 MG tablet Take 1 tablet (0.25 mg total) by mouth daily as  needed for anxiety.  . Ascorbic Acid (VITAMIN C) 1000 MG tablet Take 1,000 mg by mouth daily.  Marland Kitchen atorvastatin (LIPITOR) 10 MG tablet Take 1 tablet (10 mg total) by mouth at bedtime.  . Calcium Carbonate-Vit D-Min (CALCIUM 1200 PO) Take 1 tablet by mouth daily.   . carvedilol (COREG) 25 MG tablet Take 25 mg by mouth 2 (two) times daily with a meal.    . clopidogrel (PLAVIX) 75 MG tablet Take 1 tablet (75 mg total) by mouth daily.  Marland Kitchen DEXILANT 60 MG capsule Take 60 mg by mouth daily.   . furosemide (LASIX) 80 MG tablet Take 2 tablets (160 mg total) by mouth 2 (two) times daily.  Marland Kitchen glipiZIDE (GLUCOTROL) 10 MG tablet Take 10 mg by mouth 2 (two) times daily before a meal.   . isosorbide mononitrate (IMDUR) 60 MG 24 hr tablet Take 1 tablet (60 mg total) by mouth daily.  Marland Kitchen JANUVIA 100 MG tablet Take 50 mg by mouth daily.   . montelukast (SINGULAIR) 10 MG tablet Take 10 mg by mouth daily as needed for cough.  . Multiple Vitamin (MULTIVITAMIN PO) Take  1 tablet by mouth daily.   . nitroGLYCERIN (NITROSTAT) 0.4 MG SL tablet Place 1 tablet (0.4 mg total) under the tongue every 5 (five) minutes as needed.  . potassium chloride (K-DUR) 10 MEQ tablet Take 1 tablet (10 mEq total) by mouth 2 (two) times daily.  . ramipril (ALTACE) 10 MG capsule Take 10 mg by mouth daily.    Marland Kitchen RANEXA 500 MG 12 hr tablet Take 1 tablet (500 mg total) by mouth every 12 (twelve) hours.  . ranitidine (ZANTAC) 300 MG tablet Take 300 mg by mouth at bedtime.   Marland Kitchen VASCEPA 1 g CAPS Take 2 capsules by mouth 2 times daily with meals. triglycerides  . Vitamin D, Ergocalciferol, (DRISDOL) 50000 units CAPS capsule Take 1 capsule by mouth every 14 (fourteen) days.   . [DISCONTINUED] ALPRAZolam (XANAX) 0.25 MG tablet Take 0.25 mg by mouth daily as needed for anxiety.     Allergies:   Codeine; Ivp dye [iodinated diagnostic agents]; Metformin; Metrizamide; and Pioglitazone   Social History   Socioeconomic History  . Marital status: Married     Spouse name: Not on file  . Number of children: Not on file  . Years of education: Not on file  . Highest education level: Not on file  Occupational History  . Not on file  Social Needs  . Financial resource strain: Not on file  . Food insecurity:    Worry: Not on file    Inability: Not on file  . Transportation needs:    Medical: Not on file    Non-medical: Not on file  Tobacco Use  . Smoking status: Never Smoker  . Smokeless tobacco: Never Used  Substance and Sexual Activity  . Alcohol use: No    Alcohol/week: 0.0 standard drinks  . Drug use: No  . Sexual activity: Not on file  Lifestyle  . Physical activity:    Days per week: Not on file    Minutes per session: Not on file  . Stress: Not on file  Relationships  . Social connections:    Talks on phone: Not on file    Gets together: Not on file    Attends religious service: Not on file    Active member of club or organization: Not on file    Attends meetings of clubs or organizations: Not on file    Relationship status: Not on file  Other Topics Concern  . Not on file  Social History Narrative  . Not on file     Family History: The patient's family history includes Alzheimer's disease in her mother; Arthritis in her mother and sister; Bladder Cancer in her father; Heart attack in her maternal grandfather and maternal grandmother; Hyperlipidemia in her father and mother; Vitiligo in her sister. ROS:   Please see the history of present illness.    All other systems reviewed and are negative.  EKGs/Labs/Other Studies Reviewed:    The following studies were reviewed today:  EKG:  EKG ordered today.  The ekg ordered today demonstrates normal dual paced CRT EKG  Recent Labs:  12/26/17 CMP normal Chol 248 LDL 73   Physical Exam:    VS:  BP (!) 102/56 (BP Location: Right Arm, Patient Position: Sitting, Cuff Size: Normal)   Pulse 65   Ht 5\' 2"  (1.575 m)   Wt 124 lb 12.8 oz (56.6 kg)   SpO2 94%   BMI 22.83 kg/m      Wt Readings from Last 3 Encounters:  03/04/18 124  lb 12.8 oz (56.6 kg)  10/29/17 127 lb 3.2 oz (57.7 kg)  08/07/17 125 lb (56.7 kg)     GEN:  Well nourished, well developed in no acute distress HEENT: Normal NECK: No JVD; No carotid bruits LYMPHATICS: No lymphadenopathy CARDIAC: No chest wall tenderness RRR, no murmurs, rubs, gallops RESPIRATORY:  Clear to auscultation without rales, wheezing or rhonchi  ABDOMEN: Soft, non-tender, non-distended MUSCULOSKELETAL:  No edema; No deformity  SKIN: Warm and dry NEUROLOGIC:  Alert and oriented x 3 PSYCHIATRIC:  Normal affect    Signed, Norman Herrlich, MD  03/04/2018 11:43 AM    [facility]

## 2018-03-04 ENCOUNTER — Encounter: Payer: Self-pay | Admitting: Cardiology

## 2018-03-04 ENCOUNTER — Ambulatory Visit: Payer: Medicare Other | Admitting: Cardiology

## 2018-03-04 VITALS — BP 102/56 | HR 65 | Ht 62.0 in | Wt 124.8 lb

## 2018-03-04 DIAGNOSIS — N183 Chronic kidney disease, stage 3 unspecified: Secondary | ICD-10-CM

## 2018-03-04 DIAGNOSIS — R079 Chest pain, unspecified: Secondary | ICD-10-CM | POA: Diagnosis not present

## 2018-03-04 DIAGNOSIS — I5042 Chronic combined systolic (congestive) and diastolic (congestive) heart failure: Secondary | ICD-10-CM | POA: Diagnosis not present

## 2018-03-04 DIAGNOSIS — F419 Anxiety disorder, unspecified: Secondary | ICD-10-CM

## 2018-03-04 DIAGNOSIS — Z95810 Presence of automatic (implantable) cardiac defibrillator: Secondary | ICD-10-CM | POA: Diagnosis not present

## 2018-03-04 DIAGNOSIS — E785 Hyperlipidemia, unspecified: Secondary | ICD-10-CM

## 2018-03-04 DIAGNOSIS — I130 Hypertensive heart and chronic kidney disease with heart failure and stage 1 through stage 4 chronic kidney disease, or unspecified chronic kidney disease: Secondary | ICD-10-CM | POA: Diagnosis not present

## 2018-03-04 MED ORDER — NITROGLYCERIN 0.4 MG SL SUBL: 0.4000 mg | SUBLINGUAL_TABLET | SUBLINGUAL | 6 refills | Status: DC | PRN

## 2018-03-04 MED ORDER — ALPRAZOLAM 0.25 MG PO TABS: 0.2500 mg | ORAL_TABLET | Freq: Every day | ORAL | 0 refills | Status: DC | PRN

## 2018-03-04 NOTE — Patient Instructions (Addendum)

## 2018-03-24 LAB — CUP PACEART REMOTE DEVICE CHECK
Battery Remaining Percentage: 28 %
Battery Voltage: 2.86 V
Brady Statistic AP VP Percent: 1 %
Brady Statistic AS VP Percent: 99 %
Brady Statistic AS VS Percent: 1 %
Brady Statistic RA Percent Paced: 1 %
Date Time Interrogation Session: 20191021092905
HIGH POWER IMPEDANCE MEASURED VALUE: 65 Ohm
HighPow Impedance: 65 Ohm
Implantable Lead Implant Date: 20140702
Implantable Lead Location: 753859
Implantable Pulse Generator Implant Date: 20150730
Lead Channel Impedance Value: 300 Ohm
Lead Channel Impedance Value: 510 Ohm
Lead Channel Pacing Threshold Pulse Width: 0.5 ms
Lead Channel Pacing Threshold Pulse Width: 1 ms
Lead Channel Sensing Intrinsic Amplitude: 11.7 mV
Lead Channel Sensing Intrinsic Amplitude: 2.6 mV
Lead Channel Setting Pacing Amplitude: 2 V
Lead Channel Setting Pacing Amplitude: 2 V
MDC IDC LEAD IMPLANT DT: 20140702
MDC IDC LEAD IMPLANT DT: 20150730
MDC IDC LEAD LOCATION: 753858
MDC IDC LEAD LOCATION: 753860
MDC IDC MSMT BATTERY REMAINING LONGEVITY: 16 mo
MDC IDC MSMT LEADCHNL LV PACING THRESHOLD AMPLITUDE: 2.5 V
MDC IDC MSMT LEADCHNL RA IMPEDANCE VALUE: 480 Ohm
MDC IDC MSMT LEADCHNL RA PACING THRESHOLD AMPLITUDE: 0.75 V
MDC IDC MSMT LEADCHNL RA PACING THRESHOLD PULSEWIDTH: 0.5 ms
MDC IDC MSMT LEADCHNL RV PACING THRESHOLD AMPLITUDE: 1 V
MDC IDC PG SERIAL: 7147055
MDC IDC SET LEADCHNL LV PACING AMPLITUDE: 3 V
MDC IDC SET LEADCHNL LV PACING PULSEWIDTH: 1 ms
MDC IDC SET LEADCHNL RV PACING PULSEWIDTH: 0.5 ms
MDC IDC SET LEADCHNL RV SENSING SENSITIVITY: 0.5 mV
MDC IDC STAT BRADY AP VS PERCENT: 1 %

## 2018-04-22 ENCOUNTER — Ambulatory Visit (INDEPENDENT_AMBULATORY_CARE_PROVIDER_SITE_OTHER): Payer: Medicare Other

## 2018-04-22 DIAGNOSIS — I428 Other cardiomyopathies: Secondary | ICD-10-CM

## 2018-04-23 LAB — CUP PACEART REMOTE DEVICE CHECK
Battery Remaining Longevity: 16 mo
Brady Statistic AP VP Percent: 1 %
Brady Statistic AS VP Percent: 99 %
Brady Statistic AS VS Percent: 1 %
Date Time Interrogation Session: 20200120080327
HIGH POWER IMPEDANCE MEASURED VALUE: 64 Ohm
HIGH POWER IMPEDANCE MEASURED VALUE: 64 Ohm
Implantable Lead Implant Date: 20140702
Implantable Lead Implant Date: 20150730
Implantable Lead Location: 753858
Implantable Lead Location: 753859
Implantable Lead Model: 7122
Implantable Pulse Generator Implant Date: 20150730
Lead Channel Pacing Threshold Pulse Width: 0.5 ms
Lead Channel Pacing Threshold Pulse Width: 0.5 ms
Lead Channel Pacing Threshold Pulse Width: 1 ms
Lead Channel Sensing Intrinsic Amplitude: 3.3 mV
Lead Channel Setting Pacing Amplitude: 2 V
Lead Channel Setting Pacing Amplitude: 2 V
Lead Channel Setting Pacing Amplitude: 3 V
Lead Channel Setting Pacing Pulse Width: 1 ms
MDC IDC LEAD IMPLANT DT: 20140702
MDC IDC LEAD LOCATION: 753860
MDC IDC MSMT BATTERY REMAINING PERCENTAGE: 26 %
MDC IDC MSMT BATTERY VOLTAGE: 2.83 V
MDC IDC MSMT LEADCHNL LV IMPEDANCE VALUE: 550 Ohm
MDC IDC MSMT LEADCHNL LV PACING THRESHOLD AMPLITUDE: 2.5 V
MDC IDC MSMT LEADCHNL RA IMPEDANCE VALUE: 480 Ohm
MDC IDC MSMT LEADCHNL RA PACING THRESHOLD AMPLITUDE: 0.75 V
MDC IDC MSMT LEADCHNL RV IMPEDANCE VALUE: 330 Ohm
MDC IDC MSMT LEADCHNL RV PACING THRESHOLD AMPLITUDE: 1 V
MDC IDC MSMT LEADCHNL RV SENSING INTR AMPL: 11.7 mV
MDC IDC SET LEADCHNL RV PACING PULSEWIDTH: 0.5 ms
MDC IDC SET LEADCHNL RV SENSING SENSITIVITY: 0.5 mV
MDC IDC STAT BRADY AP VS PERCENT: 1 %
MDC IDC STAT BRADY RA PERCENT PACED: 1 %
Pulse Gen Serial Number: 7147055

## 2018-04-23 NOTE — Progress Notes (Signed)
Remote ICD transmission.   

## 2018-05-08 ENCOUNTER — Other Ambulatory Visit: Payer: Self-pay

## 2018-05-08 ENCOUNTER — Other Ambulatory Visit: Payer: Self-pay | Admitting: Cardiology

## 2018-05-08 MED ORDER — FUROSEMIDE 80 MG PO TABS: 160.0000 mg | ORAL_TABLET | Freq: Two times a day (BID) | ORAL | 0 refills | Status: DC

## 2018-05-08 MED ORDER — RANOLAZINE ER 500 MG PO TB12: 500.0000 mg | ORAL_TABLET | Freq: Two times a day (BID) | ORAL | 2 refills | Status: DC

## 2018-05-08 MED ORDER — POTASSIUM CHLORIDE ER 10 MEQ PO TBCR: 10.0000 meq | EXTENDED_RELEASE_TABLET | Freq: Two times a day (BID) | ORAL | 0 refills | Status: DC

## 2018-05-08 MED ORDER — RANEXA 500 MG PO TB12: 500.0000 mg | ORAL_TABLET | Freq: Two times a day (BID) | ORAL | 0 refills | Status: DC

## 2018-05-08 NOTE — Addendum Note (Signed)
Addended by: Carren Rang on: 05/08/2018 11:44 AM   Modules accepted: Orders

## 2018-06-30 DIAGNOSIS — Z8673 Personal history of transient ischemic attack (TIA), and cerebral infarction without residual deficits: Secondary | ICD-10-CM

## 2018-06-30 DIAGNOSIS — R42 Dizziness and giddiness: Secondary | ICD-10-CM

## 2018-06-30 HISTORY — DX: Dizziness and giddiness: R42

## 2018-06-30 HISTORY — DX: Personal history of transient ischemic attack (TIA), and cerebral infarction without residual deficits: Z86.73

## 2018-07-18 ENCOUNTER — Other Ambulatory Visit: Payer: Self-pay | Admitting: Cardiology

## 2018-07-18 MED ORDER — ISOSORBIDE MONONITRATE ER 60 MG PO TB24: 60.0000 mg | ORAL_TABLET | Freq: Every day | ORAL | 1 refills | Status: DC

## 2018-07-18 NOTE — Telephone Encounter (Signed)
Imdur sent to [facility]

## 2018-07-18 NOTE — Telephone Encounter (Signed)
°*  STAT* If patient is at the pharmacy, call can be transferred to refill team.   1. Which medications need to be refilled? (please list name of each medication and dose if known) isosorbide mononitrate (IMDUR) 60 MG 24 hr tablet   2. Which pharmacy/location (including street and city if local pharmacy) is medication to be sent to?  [place], [place] - [address] [phone] (Phone) [phone] (Fax)    3. Do they need a 30 day or 90 day supply? 90 day

## 2018-07-22 ENCOUNTER — Other Ambulatory Visit: Payer: Self-pay

## 2018-07-22 ENCOUNTER — Ambulatory Visit: Payer: Medicare Other | Admitting: *Deleted

## 2018-07-23 ENCOUNTER — Telehealth: Payer: Self-pay

## 2018-07-23 NOTE — Telephone Encounter (Signed)
Left message for patient to remind of missed remote transmission.  

## 2018-07-24 ENCOUNTER — Telehealth: Payer: Self-pay | Admitting: *Deleted

## 2018-07-24 NOTE — Telephone Encounter (Signed)
Error

## 2018-07-24 NOTE — Telephone Encounter (Signed)
Pt supposed to have pace maker checked on 4/20 but when the time came to monitor her pacer, monitor did not work. Pt states had checked at [facility] 4/1 - 4/3 and it was okay. Pt would like nurse to wait until 4/23 to call: [phone]

## 2018-07-25 NOTE — Telephone Encounter (Signed)
Spoke w/ pt and attempted to help her send transmission. When she started the transmission the tower lite up and started making an abnormal sound and orange lights was flashing. Instructed pt to call tech support. Pt verbalized understanding.

## 2018-07-31 ENCOUNTER — Encounter: Payer: Self-pay | Admitting: Cardiology

## 2018-08-12 ENCOUNTER — Other Ambulatory Visit: Payer: Self-pay

## 2018-08-12 ENCOUNTER — Ambulatory Visit (INDEPENDENT_AMBULATORY_CARE_PROVIDER_SITE_OTHER): Payer: Medicare Other | Admitting: *Deleted

## 2018-08-12 DIAGNOSIS — I5042 Chronic combined systolic (congestive) and diastolic (congestive) heart failure: Secondary | ICD-10-CM

## 2018-08-12 DIAGNOSIS — I428 Other cardiomyopathies: Secondary | ICD-10-CM | POA: Diagnosis not present

## 2018-08-15 LAB — CUP PACEART REMOTE DEVICE CHECK
Battery Remaining Longevity: 12 mo
Battery Remaining Percentage: 22 %
Brady Statistic RA Percent Paced: 1 %
Brady Statistic RV Percent Paced: 100 %
Date Time Interrogation Session: 20200514170001
HighPow Impedance: 71 Ohm
Implantable Lead Implant Date: 20140702
Implantable Lead Implant Date: 20140702
Implantable Lead Implant Date: 20150730
Implantable Lead Location: 753858
Implantable Lead Location: 753859
Implantable Lead Location: 753860
Implantable Lead Model: 7122
Implantable Pulse Generator Implant Date: 20150730
Lead Channel Impedance Value: 340 Ohm
Lead Channel Impedance Value: 440 Ohm
Lead Channel Impedance Value: 480 Ohm
Lead Channel Sensing Intrinsic Amplitude: 2 mV
Lead Channel Setting Pacing Amplitude: 2 V
Lead Channel Setting Pacing Amplitude: 2 V
Lead Channel Setting Pacing Amplitude: 3 V
Lead Channel Setting Pacing Pulse Width: 0.5 ms
Lead Channel Setting Pacing Pulse Width: 1 ms
Lead Channel Setting Sensing Sensitivity: 0.5 mV
Pulse Gen Serial Number: 7147055

## 2018-08-17 LAB — CUP PACEART REMOTE DEVICE CHECK
Date Time Interrogation Session: 20200516090538
Implantable Lead Implant Date: 20140702
Implantable Lead Implant Date: 20140702
Implantable Lead Implant Date: 20150730
Implantable Lead Location: 753858
Implantable Lead Location: 753859
Implantable Lead Location: 753860
Implantable Lead Model: 7122
Implantable Pulse Generator Implant Date: 20150730
Pulse Gen Serial Number: 7147055

## 2018-08-23 ENCOUNTER — Other Ambulatory Visit: Payer: Self-pay | Admitting: Cardiology

## 2018-08-23 DIAGNOSIS — I739 Peripheral vascular disease, unspecified: Secondary | ICD-10-CM

## 2018-08-23 NOTE — Progress Notes (Signed)
Remote ICD transmission.   

## 2018-08-28 ENCOUNTER — Other Ambulatory Visit: Payer: Self-pay | Admitting: Cardiology

## 2018-09-13 ENCOUNTER — Other Ambulatory Visit: Payer: Self-pay | Admitting: Cardiology

## 2018-09-19 ENCOUNTER — Ambulatory Visit: Payer: Medicare Other | Admitting: Cardiology

## 2018-09-19 ENCOUNTER — Other Ambulatory Visit: Payer: Self-pay

## 2018-09-19 ENCOUNTER — Encounter: Payer: Self-pay | Admitting: Cardiology

## 2018-09-19 VITALS — BP 136/66 | HR 78 | Temp 98.2°F | Wt 121.1 lb

## 2018-09-19 DIAGNOSIS — I5042 Chronic combined systolic (congestive) and diastolic (congestive) heart failure: Secondary | ICD-10-CM | POA: Diagnosis not present

## 2018-09-19 DIAGNOSIS — I130 Hypertensive heart and chronic kidney disease with heart failure and stage 1 through stage 4 chronic kidney disease, or unspecified chronic kidney disease: Secondary | ICD-10-CM | POA: Diagnosis not present

## 2018-09-19 DIAGNOSIS — N183 Chronic kidney disease, stage 3 (moderate): Secondary | ICD-10-CM

## 2018-09-19 DIAGNOSIS — E785 Hyperlipidemia, unspecified: Secondary | ICD-10-CM | POA: Diagnosis not present

## 2018-09-19 DIAGNOSIS — I2510 Atherosclerotic heart disease of native coronary artery without angina pectoris: Secondary | ICD-10-CM

## 2018-09-19 DIAGNOSIS — R42 Dizziness and giddiness: Secondary | ICD-10-CM

## 2018-09-19 MED ORDER — RANEXA 500 MG PO TB12: 500.0000 mg | ORAL_TABLET | Freq: Every day | ORAL | 1 refills | Status: DC

## 2018-09-19 NOTE — Progress Notes (Signed)
Cardiology Office Note:    Date:  09/19/2018   ID:  Shari [name], DOB May 05, 1934, MRN [id number]  PCP:  Olive Bass, MD  Cardiologist:  Norman Herrlich, MD    Referring MD: Olive Bass, MD    ASSESSMENT:    1. Chronic combined systolic and diastolic heart failure (HCC)   2. Hypertensive heart and kidney disease with heart failure and with chronic kidney disease stage III (HCC)   3. Mild CAD   4. Hyperlipidemia, unspecified hyperlipidemia type   5. Dizzy    PLAN:    In order of problems listed above:  1. Heart failure is compensated New York Heart Association class I she has no fluid overload I agree with the reduced dose of diuretic and continue her current guideline directed medical therapy with normalization of ejection fraction. 2. Hypertensive heart disease stable standing blood pressure by me 132/80 continue current guideline directed treatment 3. CAD stable New York Heart Association class I continue medical therapy including clopidogrel statin and having no anginal discomfort on current medical treatment 4. Continue high intensity statin with CAD 5. May be due to ranolazine decrease to once daily and if persistent discontinue   Next appointment: 6 months   Medication Adjustments/Labs and Tests Ordered: Current medicines are reviewed at length with the patient today.  Concerns regarding medicines are outlined above.  No orders of the defined types were placed in this encounter.  Meds ordered this encounter  Medications   RANEXA 500 MG 12 hr tablet    Sig: Take 1 tablet (500 mg total) by mouth daily.    Dispense:  90 tablet    Refill:  1    No chief complaint on file.   History of Present Illness:    Shari [name] is a 84 y.o. female with a hx of LBBB, CHF EF 77% 06/04/16 s/p CRT-D, DLD, HTN, CKD3, PAD, mild coronary atherosclerosis, costal chondritis, DM2 last seen 03/04/18.   She was admitted to [facility] 06/30/18-07/02/18 for complaint of  dizziness treated with IVF and DAPT of aspirin 81mg  and plavix for 3 weeks.     08/14/18 remote device check: "normal remote reviewed"  I was unaware she had been in the hospital in March she was dizzy she takes ranolazine she was admitted with concern of stroke there is no indication occurred echocardiogram showed normal ejection fraction normal pacemaker function and carotid CTA showed no significant stenosis.  Her dose of diuretic and beta-blocker was decreased and she still has vaguely dizzy and I suspect his Ranexa and I asked her to decrease to once daily and if it persist will discontinue.  I reviewed her labs imaging discharge summary and echocardiogram before the visit. Compliance with diet, lifestyle and medications: Yes Past Medical History:  Diagnosis Date   Anxiety    Automatic implantable cardioverter-defibrillator in situ 10/25/2014   Overview:  Overview:  dual chamber ICD,upgraded to CRT with LBBB and nonischemic cardiomyopathy   Biventricular ICD (implantable cardioverter-defibrillator) in place 10/25/2014   Overview:  BIV -ICD,upgraded to CRT with LBBB and nonischemic cardiomyopathy   Chronic coronary artery disease 10/25/2014   Costochondritis 10/25/2014   Diabetes mellitus 2 years   Diverticulitis    Elevated troponin 06/04/2016   Last Assessment & Plan:  Likely may be related to ventricular pacing and underlying cardiomyopathy.  The trajectory of the biomarker elevation and neither is her clinical history suggestive of acute coronary syndrome.  Further stratification with nuclear stress testing  GERD (gastroesophageal reflux disease)    Glaucoma    High cholesterol    Hypertension    Hypertensive heart disease with heart failure ([place]) 10/25/2014   Hypokalemia 06/04/2016   Last Assessment & Plan:  Replace with potassium chloride.   IBS (irritable bowel syndrome)    Malignant hypertension with congestive heart failure ([place]) 05/21/2015   Mild CAD 10/25/2014    Onychomycosis 11/10/2010   Osteoarthritis    PAD (peripheral artery disease) ([place]) 11/03/2014   Overview:  status post insertion of aortic stent and left common iliac stent by Dr. Bridgett Larsson in August of 2016   Peripheral arterial occlusive disease ([place]) 11/03/2014   Precordial chest pain 06/04/2016   Last Assessment & Plan:  My suspicion is that this is likely related to gastroesophageal reflux disease with acute indigestion last evening.  The biomarker elevation is not completely unexpected in this elderly patient with reported cardiomyopathy and current demand ventricular pacing.  Nonetheless she does have a history of extensive vascular disease (though apparently no significant coronary atherosclerosis in 2013) so we will proceed with a noninvasive risk stratification with pharmacological nuclear stress testing.  Assuming this is a low risk or normal study then I would not advise any invasive cardiac evaluation at this time.   Preoperative cardiovascular examination 04/02/2015   Rheumatoid arthritis involving multiple sites ([place]) 05/21/2015   Stroke ([place])    Vitamin D deficiency 06/20/2016    Past Surgical History:  Procedure Laterality Date   FOOT SURGERY     pacemaker surgery     a-fib and was done july 2014   PERIPHERAL VASCULAR CATHETERIZATION N/A 11/05/2014   Procedure: Abdominal Aortogram;  Surgeon: Conrad Deenwood, MD;  Location: [place] [facility];  Service: Cardiovascular;  Laterality: N/A;    Current Medications: Current Meds  Medication Sig   Acetaminophen (TYLENOL ARTHRITIS PAIN PO) Take 2 tablets by mouth 2 (two) times daily. Reported on 07/07/2015   ALPRAZolam (XANAX) 0.25 MG tablet Take 1 tablet (0.25 mg total) by mouth daily as needed for anxiety.   Ascorbic Acid (VITAMIN C) 1000 MG tablet Take 1,000 mg by mouth daily.   atorvastatin (LIPITOR) 10 MG tablet Take 1 tablet (10 mg total) by mouth at bedtime.   Calcium Carbonate-Vit D-Min (CALCIUM 1200 PO) Take 1 tablet by mouth  daily.    carvedilol (COREG) 25 MG tablet Take 12.5 mg by mouth 2 (two) times daily with a meal.    clopidogrel (PLAVIX) 75 MG tablet TAKE ONE TABLET BY MOUTH EVERY DAY   DEXILANT 60 MG capsule Take 60 mg by mouth daily.    furosemide (LASIX) 80 MG tablet Take 1 tablet by mouth daily.   glipiZIDE (GLUCOTROL) 10 MG tablet Take 10 mg by mouth 2 (two) times daily before a meal.    isosorbide mononitrate (IMDUR) 60 MG 24 hr tablet Take 1 tablet (60 mg total) by mouth daily.   JANUVIA 100 MG tablet Take 50 mg by mouth daily.    montelukast (SINGULAIR) 10 MG tablet Take 10 mg by mouth daily as needed for cough.   Multiple Vitamin (MULTIVITAMIN PO) Take 1 tablet by mouth daily.    nitroGLYCERIN (NITROSTAT) 0.4 MG SL tablet Place 1 tablet (0.4 mg total) under the tongue every 5 (five) minutes as needed.   potassium chloride (MICRO-K) 10 MEQ CR capsule Take 10 mEq by mouth daily.   ramipril (ALTACE) 10 MG capsule Take 10 mg by mouth daily.     RANEXA 500 MG  12 hr tablet Take 1 tablet (500 mg total) by mouth daily.   VASCEPA 1 g CAPS Take 2 capsules by mouth 2 times daily with meals. triglycerides   Vitamin D, Ergocalciferol, (DRISDOL) 50000 units CAPS capsule Take 1 capsule by mouth every 14 (fourteen) days.    [DISCONTINUED] RANEXA 500 MG 12 hr tablet Take 1 tablet (500 mg total) by mouth every 12 (twelve) hours.     Allergies:   Codeine, Dulaglutide, Ivp dye [iodinated diagnostic agents], Metformin, Metrizamide, and Pioglitazone   Social History   Socioeconomic History   Marital status: Married    Spouse name: Not on file   Number of children: Not on file   Years of education: Not on file   Highest education level: Not on file  Occupational History   Not on file  Social Needs   Financial resource strain: Not on file   Food insecurity    Worry: Not on file    Inability: Not on file   Transportation needs    Medical: Not on file    Non-medical: Not on file    Tobacco Use   Smoking status: Never Smoker   Smokeless tobacco: Never Used  Substance and Sexual Activity   Alcohol use: No    Alcohol/week: 0.0 standard drinks   Drug use: No   Sexual activity: Not on file  Lifestyle   Physical activity    Days per week: Not on file    Minutes per session: Not on file   Stress: Not on file  Relationships   Social connections    Talks on phone: Not on file    Gets together: Not on file    Attends religious service: Not on file    Active member of club or organization: Not on file    Attends meetings of clubs or organizations: Not on file    Relationship status: Not on file  Other Topics Concern   Not on file  Social History Narrative   Not on file     Family History: The patient's family history includes Alzheimer's disease in her mother; Arthritis in her mother and sister; Bladder Cancer in her father; Heart attack in her maternal grandfather and maternal grandmother; Hyperlipidemia in her father and mother; Vitiligo in her sister. ROS:   Please see the history of present illness.    All other systems reviewed and are negative.  EKGs/Labs/Other Studies Reviewed:    The following studies were reviewed today: CTA head and neck 06/30/18: Result Impression   1. No acute arterial abnormality in the head and neck. Mild, nonflow limiting atherosclerotic calcification within the bilateral carotid arteries as described above. 2. Mild cerebral atrophy and microvascular ischemic change.   Echo:    Conclusions:         Mild concentric left ventricular hypertrophy is observed.  Global left ventricular wall motion and contractility are within normal  limits.  There is a trace of aortic regurgitation.  The right ventricular systolic pressure is calculated at 29.05 mmHg.   A trivial pericardial effusion is visualized.  Intravenous agitated saline contrast was used to assess intracardiac  shunting.   There is no  evidence of patent foramen ovale shunting.     Electronically signed by: Carmon Ginsberg, MD, [facility]  07/01/2018 at 13:06:25   EKG: ECG 12-Lead (06/30/2018 12:59 PM EDT) ECG 12-Lead (06/30/2018 12:59 PM EDT)  Component Value Ref Range Performed At Pathologist Signature  VENTRICULAR RATE 63 BPM MUSE   ATRIAL  RATE 63 BPM MUSE   P-R INTERVAL 156 ms MUSE   QRS DURATION 132 ms MUSE   Q-T INTERVAL 474 ms MUSE   QTC CALCULATION(BAZETT) 485 ms MUSE   CALCULATED P AXIS 52 degrees MUSE   CALCULATED R AXIS -87 degrees MUSE   CALCULATED T AXIS 87 degrees MUSE   DIAGNOSIS  Suspect unspecified pacemaker failure Atrial-sensed ventricular-paced rhythm Abnormal ECG When compared with ECG of 04-Jun-2016 01:25, Vent. rate has decreased BY  5 BPM Confirmed by Hartley Barefoot ([id number]) on 07/01/2018 6:49:25 PM        Recent Labs: No results found for requested labs within last 8760 hours.  Recent Lipid Panel No results found for: CHOL, TRIG, HDL, CHOLHDL, VLDL, LDLCALC, LDLDIRECT  Physical Exam:    VS:  BP 136/66 (BP Location: Right Arm, Patient Position: Sitting, Cuff Size: Normal)    Pulse 78    Temp 98.2 F (36.8 C)    Wt 121 lb 1.9 oz (54.9 kg)    SpO2 97%    BMI 22.15 kg/m     Wt Readings from Last 3 Encounters:  09/19/18 121 lb 1.9 oz (54.9 kg)  03/04/18 124 lb 12.8 oz (56.6 kg)  10/29/17 127 lb 3.2 oz (57.7 kg)     GEN:  Well nourished, well developed in no acute distress HEENT: Normal NECK: No JVD; No carotid bruits LYMPHATICS: No lymphadenopathy CARDIAC: RRR, no murmurs, rubs, gallops RESPIRATORY:  Clear to auscultation without rales, wheezing or rhonchi  ABDOMEN: Soft, non-tender, non-distended MUSCULOSKELETAL:  No edema; No deformity  SKIN: Warm and dry NEUROLOGIC:  Alert and oriented x 3 PSYCHIATRIC:  Normal affect    Signed, Norman Herrlich, MD  09/19/2018 3:44 PM    [facility]

## 2018-09-19 NOTE — Patient Instructions (Signed)
Medication Instructions:  Your physician has recommended you make the following change in your medication:   CHANGE: Ranexa 500 mg (1 tab) daily  If you need a refill on your cardiac medications before your next appointment, please call your pharmacy.   Lab work: None If you have labs (blood work) drawn today and your tests are completely normal, you will receive your results only by: Marland Kitchen MyChart Message (if you have MyChart) OR . A paper copy in the mail If you have any lab test that is abnormal or we need to change your treatment, we will call you to review the results.  Testing/Procedures: None  Follow-Up: At [facility], you and your health needs are our priority.  As part of our continuing mission to provide you with exceptional heart care, we have created designated Provider Care Teams.  These Care Teams include your primary Cardiologist (physician) and Advanced Practice Providers (APPs -  Physician Assistants and Nurse Practitioners) who all work together to provide you with the care you need, when you need it. You will need a follow up appointment in 6 months.   Any Other Special Instructions Will Be Listed Below (If Applicable).

## 2018-09-20 ENCOUNTER — Telehealth: Payer: Self-pay | Admitting: Cardiology

## 2018-09-20 NOTE — Telephone Encounter (Signed)
Patient was on Ranolazine, rx changed to Ranexa at last office visit 09/19/18. Pharmacy would like to know if generic (Ranolazine) can be used or does rx need to be brand name (Ranexa).  pls advise, tx

## 2018-09-20 NOTE — Telephone Encounter (Signed)
Generic Ranolazine is fine. Thank you!

## 2018-09-20 NOTE — Telephone Encounter (Signed)
Called [facility], informed pharmacist generic ranolazine can be substituted for Ranexa. New rx to read:  ranolazine 500mg  QD.

## 2018-09-20 NOTE — Telephone Encounter (Signed)
[facility] has questions about patients Ranexa refill

## 2018-10-24 ENCOUNTER — Encounter: Payer: Self-pay | Admitting: Sports Medicine

## 2018-10-24 ENCOUNTER — Ambulatory Visit (INDEPENDENT_AMBULATORY_CARE_PROVIDER_SITE_OTHER): Payer: Medicare Other | Admitting: Sports Medicine

## 2018-10-24 ENCOUNTER — Other Ambulatory Visit: Payer: Self-pay

## 2018-10-24 DIAGNOSIS — M79674 Pain in right toe(s): Secondary | ICD-10-CM | POA: Diagnosis not present

## 2018-10-24 DIAGNOSIS — B351 Tinea unguium: Secondary | ICD-10-CM

## 2018-10-24 DIAGNOSIS — M79675 Pain in left toe(s): Secondary | ICD-10-CM

## 2018-10-24 DIAGNOSIS — I739 Peripheral vascular disease, unspecified: Secondary | ICD-10-CM

## 2018-10-24 DIAGNOSIS — E1140 Type 2 diabetes mellitus with diabetic neuropathy, unspecified: Secondary | ICD-10-CM | POA: Diagnosis not present

## 2018-10-24 DIAGNOSIS — M069 Rheumatoid arthritis, unspecified: Secondary | ICD-10-CM

## 2018-10-24 NOTE — Progress Notes (Signed)
Subjective: Shari [name] is a 84 y.o. female patient with history of diabetes who returns to office today complaining of callus skin and long, painful nails  while ambulating in shoes; unable to trim. Patient states that the glucose reading this morning was 176 and last A1c was 8.2. Patient denies any new changes in medication or new problems except suffering a mini stroke in March.   Saw PCP, Dr. Garlon Hatchet 1 month ago.  Patient Active Problem List   Diagnosis Date Noted  . Vitamin D deficiency 06/20/2016  . Elevated troponin 06/04/2016  . Hypokalemia 06/04/2016  . Precordial chest pain 06/04/2016  . Rheumatoid arthritis involving multiple sites ([place]) 05/21/2015  . Preoperative cardiovascular examination 04/02/2015  . PAD (peripheral artery disease) ([place]) 11/03/2014  . Peripheral arterial occlusive disease ([place]) 11/03/2014  . Automatic implantable cardioverter-defibrillator in situ 10/25/2014  . 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 Chronic combined systolic and diastolic heart failure ([place]) 10/25/2014  . Costochondritis 10/25/2014  . Mild CAD 10/25/2014  . Hyperlipidemia 11/10/2010  . Hypertensive heart and kidney disease with heart failure and with chronic kidney disease stage III ([place]) 11/10/2010  . ibs 11/10/2010  . Osteoarthritis 11/10/2010  . Diverticulitis 11/10/2010  . GERD (gastroesophageal reflux disease) 11/10/2010  . Glaucoma 11/10/2010  . Diabetes mellitus 11/10/2010  . Onychomycosis 11/10/2010   Current Outpatient Medications on File Prior to Visit  Medication Sig Dispense Refill  . Acetaminophen (TYLENOL ARTHRITIS PAIN PO) Take 2 tablets by mouth 2 (two) times daily. Reported on 07/07/2015    . ALPRAZolam (XANAX) 0.25 MG tablet Take 1 tablet (0.25 mg total) by mouth daily as needed for anxiety. 30 tablet 0  . Ascorbic Acid (VITAMIN C) 1000 MG tablet Take 1,000 mg by mouth daily.    Marland Kitchen atorvastatin (LIPITOR) 10 MG tablet Take 1  tablet (10 mg total) by mouth at bedtime. 90 tablet 1  . Calcium Carbonate-Vit D-Min (CALCIUM 1200 PO) Take 1 tablet by mouth daily.     . carvedilol (COREG) 25 MG tablet Take 12.5 mg by mouth 2 (two) times daily with a meal.     . clopidogrel (PLAVIX) 75 MG tablet TAKE ONE TABLET BY MOUTH EVERY DAY 90 tablet 2  . DEXILANT 60 MG capsule Take 60 mg by mouth daily.     . furosemide (LASIX) 80 MG tablet Take 1 tablet by mouth daily.    Marland Kitchen glipiZIDE (GLUCOTROL) 10 MG tablet Take 10 mg by mouth 2 (two) times daily before a meal.     . isosorbide mononitrate (IMDUR) 60 MG 24 hr tablet Take 1 tablet (60 mg total) by mouth daily. 90 tablet 1  . JANUVIA 100 MG tablet Take 50 mg by mouth daily.   3  . montelukast (SINGULAIR) 10 MG tablet Take 10 mg by mouth daily as needed for cough.    . Multiple Vitamin (MULTIVITAMIN PO) Take 1 tablet by mouth daily.     . nitroGLYCERIN (NITROSTAT) 0.4 MG SL tablet Place 1 tablet (0.4 mg total) under the tongue every 5 (five) minutes as needed. 25 tablet 6  . potassium chloride (MICRO-K) 10 MEQ CR capsule Take 10 mEq by mouth daily.    . ramipril (ALTACE) 10 MG capsule Take 10 mg by mouth daily.      Marland Kitchen RANEXA 500 MG 12 hr tablet Take 1 tablet (500 mg total) by mouth daily. 90 tablet 1  . VASCEPA 1 g CAPS Take 2 capsules by mouth 2 times daily  with meals. triglycerides  3  . Vitamin D, Ergocalciferol, (DRISDOL) 50000 units CAPS capsule Take 1 capsule by mouth every 14 (fourteen) days.      No current facility-administered medications on file prior to visit.    Allergies  Allergen Reactions  . Codeine Diarrhea, Nausea And Vomiting, Other (See Comments) and Hives    All-over body aches, too Other reaction(s): GI intolerance  . Dulaglutide Other (See Comments)    Chest pain, N/V  . Ivp Dye [Iodinated Diagnostic Agents] Nausea And Vomiting and Other (See Comments)  . Metformin Other (See Comments)  . Metrizamide Nausea And Vomiting and Other (See Comments)  .  Pioglitazone Other (See Comments)    Has CHF    Recent Results (from the past 2160 hour(s))  CUP PACEART REMOTE DEVICE CHECK     Status: None   Collection Time: 08/14/18  5:17 PM  Result Value Ref Range   Pulse Generator Manufacturer SJ[employer]    Date Time Interrogation Session [id number]    Pulse Gen Model 1914-78G Dillon Bjork    Pulse Gen Serial Number [id number]    Cl[facility] Name [facility]    Implantable Pulse Generator Type Cardiac Resynch Therapy Defibulator    Implantable Pulse Generator Implant Date [id number]    Implantable Lead Manufacturer [facility]    Implantable Lead Model 1458Q Quartet    Implantable Lead Serial Number [id number]    Implantable Lead Implant Date [id number]    Implantable Lead Location Detail 1 UNKNOWN    Implantable Lead Location [id number]    Implantable Lead Manufacturer [facility]    Implantable Lead Model [id number] Tendril STS    Implantable Lead Serial Number [id number]    Implantable Lead Implant Date [id number]    Implantable Lead Location Detail 1 UNKNOWN    Implantable Lead Location [id number]    Implantable Lead Manufacturer [facility]    Implantable Lead Model [id number] Durata    Implantable Lead Serial Number [id number]    Implantable Lead Implant Date [id number]    Implantable Lead Location Detail 1 UNKNOWN    Implantable Lead Location [id number]    Lead Channel Setting Sensing Sensitivity 0.5 mV   Lead Channel Setting Sensing Adaptation Mode Adaptive Sensing    Lead Channel Setting Pacing Amplitude 2.0 V   Lead Channel Setting Pacing Pulse Width 0.5 ms   Lead Channel Setting Pacing Amplitude 2.0 V   Lead Channel Setting Pacing Pulse Width 1.0 ms   Lead Channel Setting Pacing Amplitude 3.0 V   Lead Channel Setting Pacing Capture Mode Fixed Pacing    Lead Channel Impedance Value 440 ohm   Lead Channel Sensing Intrinsic Amplitude 2 mV   Lead Channel Impedance Value 340 ohm   HighPow Impedance 71 ohm   Lead Channel Impedance Value 480 ohm   Battery Remaining Longevity 12 mo    Battery Remaining Percentage 22 %   Brady Statistic RA Percent Paced 1 %   Brady Statistic RV Percent Paced 100 %    Objective: General: Patient is awake, alert, and oriented x 3 and in no acute distress.  Integument: Skin is warm, dry and supple bilateral. Nails are tender, long, thickened and dystrophic with subungual debris, consistent with onychomycosis, 2-5 bilateral. There are no nails on bilateral hallux, or very limited nail due to previous nail procedure bilateral There is mild callus at the right hallux and medial first metatarsophalangeal joint with surrounding dry skin with no signs of infection. No open lesions present bilateral. Remaining integument unremarkable.  Vasculature: Dorsalis  Pedis pulse 1/4 bilateral. Posterior Tibial pulse 1/4 bilateral. Capillary fill time <5 sec 1-5 bilateral. Scant hair growth to the level of the digits.Temperature gradient within normal limits. Mild varicosities present bilateral. No edema present bilateral.   Neurology: The patient has slightly diminished sensation measured with a 5.07/10g Semmes Weinstein Monofilament at all pedal sites bilateral . Vibratory sensation diminished bilateral with tuning fork. No Babinski sign present bilateral.   Musculoskeletal: Asymptomatic mild hammertoes pedal deformities noted bilateral. Muscular strength 5/5 in all lower extremity muscular groups bilateral without pain on range of motion . No tenderness with calf compression bilateral.  Assessment and Plan: Problem List Items Addressed This Visit      Cardiovascular and Mediastinum   PAD (peripheral artery disease) (HCC)     Musculoskeletal and Integument   Rheumatoid arthritis involving multiple sites (HCC)    Other Visit Diagnoses    Pain due to onychomycosis of toenails of both feet    -  Primary   Type 2 diabetes, controlled, with neuropathy (HCC)         -Examined patient. -Discussed and educated patient on diabetic foot care, especially with   regards to the vascular, neurological and musculoskeletal systems.  -Mechanically debrided all nails bilateral using sterile nail nipper and filed with dremel without incident & smoothed callus2 on right foot using rotary bur without incident.   -Patient to return  in 3 months for at risk foot care -Patient advised to call the office if any problems or questions arise in the meantime.  Asencion Islam, DPM

## 2018-11-11 ENCOUNTER — Ambulatory Visit (INDEPENDENT_AMBULATORY_CARE_PROVIDER_SITE_OTHER): Payer: Medicare Other | Admitting: *Deleted

## 2018-11-11 DIAGNOSIS — I428 Other cardiomyopathies: Secondary | ICD-10-CM

## 2018-11-11 LAB — CUP PACEART REMOTE DEVICE CHECK
Date Time Interrogation Session: 20200810074154
Implantable Lead Implant Date: 20140702
Implantable Lead Implant Date: 20140702
Implantable Lead Implant Date: 20150730
Implantable Lead Location: 753858
Implantable Lead Location: 753859
Implantable Lead Location: 753860
Implantable Lead Model: 7122
Implantable Pulse Generator Implant Date: 20150730
Pulse Gen Serial Number: 7147055

## 2018-11-19 ENCOUNTER — Encounter: Payer: Self-pay | Admitting: Cardiology

## 2018-11-19 NOTE — Progress Notes (Signed)
Remote ICD transmission.   

## 2018-11-26 ENCOUNTER — Other Ambulatory Visit: Payer: Self-pay

## 2018-11-26 ENCOUNTER — Encounter: Payer: Self-pay | Admitting: Cardiology

## 2018-11-26 ENCOUNTER — Ambulatory Visit (INDEPENDENT_AMBULATORY_CARE_PROVIDER_SITE_OTHER): Payer: Medicare Other | Admitting: Cardiology

## 2018-11-26 VITALS — BP 128/74 | HR 73 | Ht 62.0 in | Wt 121.4 lb

## 2018-11-26 DIAGNOSIS — Z95810 Presence of automatic (implantable) cardiac defibrillator: Secondary | ICD-10-CM | POA: Diagnosis not present

## 2018-11-26 DIAGNOSIS — I255 Ischemic cardiomyopathy: Secondary | ICD-10-CM

## 2018-11-26 NOTE — Progress Notes (Signed)
Electrophysiology Office Note   Date:  11/26/2018   ID:  Shari [name] [birth year], MRN [id number]  PCP:  Olive Bass, MD  Cardiologist:  Dulce Sellar Primary Electrophysiologist:  Kyndell Zeiser [name], MD    No chief complaint on file.    History of Present Illness: Shari [name] is a 84 y.o. female who is being seen today for the evaluation of CHF at the request of [name], Doris Cheadle, MD. Presenting today for electrophysiology evaluation. She has a history of hypertensive heart disease, mild coronary artery disease, chronic combined systolic and diastolic heart failure, hyperlipidemia.  She has a BiV ICD in place.  Today, denies symptoms of palpitations, chest pain, shortness of breath, orthopnea, PND, lower extremity edema, claudication, dizziness, presyncope, syncope, bleeding, or neurologic sequela. The patient is tolerating medications without difficulties.     Past Medical History:  Diagnosis Date  . Anxiety   . Automatic implantable cardioverter-defibrillator in situ 10/25/2014   Overview:  Overview:  dual chamber ICD,upgraded to CRT with LBBB and nonischemic cardiomyopathy  . Biventricular ICD (implantable cardioverter-defibrillator) in place 10/25/2014   Overview:  BIV -ICD,upgraded to CRT with LBBB and nonischemic cardiomyopathy  . Chronic coronary artery disease 10/25/2014  . Costochondritis 10/25/2014  . Diabetes mellitus 2 years  . Diverticulitis   . Elevated troponin 06/04/2016   Last Assessment & Plan:  Likely may be related to ventricular pacing and underlying cardiomyopathy.  The trajectory of the biomarker elevation and neither is her clinical history suggestive of acute coronary syndrome.  Further stratification with nuclear stress testing  . GERD (gastroesophageal reflux disease)   . Glaucoma   . High cholesterol   . Hypertension   . Hypertensive heart disease with heart failure (HCC) 10/25/2014  . Hypokalemia 06/04/2016   Last Assessment & Plan:  Replace with  potassium chloride.  . IBS (irritable bowel syndrome)   . 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 Mild CAD 10/25/2014  . Onychomycosis 11/10/2010  . Osteoarthritis   . PAD (peripheral artery disease) (HCC) 11/03/2014   Overview:  status post insertion of aortic stent and left common iliac stent by Dr. Imogene Burn in August of 2016  . Peripheral arterial occlusive disease (HCC) 11/03/2014  . Precordial chest pain 06/04/2016   Last Assessment & Plan:  My suspicion is that this is likely related to gastroesophageal reflux disease with acute indigestion last evening.  The biomarker elevation is not completely unexpected in this elderly patient with reported cardiomyopathy and current demand ventricular pacing.  Nonetheless she does have a history of extensive vascular disease (though apparently no significant coronary atherosclerosis in 2013) so we Gaines Cartmell proceed with a noninvasive risk stratification with pharmacological nuclear stress testing.  Assuming this is a low risk or normal study then I would not advise any invasive cardiac evaluation at this time.  . Preoperative cardiovascular examination 04/02/2015  . Rheumatoid arthritis involving multiple sites (HCC) 05/21/2015  . Stroke (HCC)   . Vitamin D deficiency 06/20/2016   Past Surgical History:  Procedure Laterality Date  . FOOT SURGERY    . pacemaker surgery     a-fib and was done july 2014  . PERIPHERAL VASCULAR CATHETERIZATION N/A 11/05/2014   Procedure: Abdominal Aortogram;  Surgeon: Fransisco Hertz, MD;  Location: [facility] INVASIVE CV LAB;  Service: Cardiovascular;  Laterality: N/A;     Current Outpatient Medications  Medication Sig Dispense Refill  . Acetaminophen (TYLENOL ARTHRITIS PAIN PO) Take 2 tablets by mouth 2 (  two) times daily. Reported on 07/07/2015    . ALPRAZolam (XANAX) 0.25 MG tablet Take 1 tablet (0.25 mg total) by mouth daily as needed for anxiety. 30 tablet 0  . Ascorbic Acid (VITAMIN C) 1000 MG tablet Take 1,000  mg by mouth daily.    Marland Kitchen atorvastatin (LIPITOR) 10 MG tablet Take 1 tablet (10 mg total) by mouth at bedtime. 90 tablet 1  . Calcium Carbonate-Vit D-Min (CALCIUM 1200 PO) Take 1 tablet by mouth daily.     . carvedilol (COREG) 25 MG tablet Take 12.5 mg by mouth 2 (two) times daily with a meal.     . clopidogrel (PLAVIX) 75 MG tablet TAKE ONE TABLET BY MOUTH EVERY DAY 90 tablet 2  . DEXILANT 60 MG capsule Take 60 mg by mouth daily.     . furosemide (LASIX) 80 MG tablet Take 1 tablet by mouth daily.    Marland Kitchen glipiZIDE (GLUCOTROL) 10 MG tablet Take 10 mg by mouth 2 (two) times daily before a meal.     . isosorbide mononitrate (IMDUR) 60 MG 24 hr tablet Take 1 tablet (60 mg total) by mouth daily. 90 tablet 1  . JANUVIA 100 MG tablet Take 50 mg by mouth daily.   3  . montelukast (SINGULAIR) 10 MG tablet Take 10 mg by mouth daily as needed for cough.    . Multiple Vitamin (MULTIVITAMIN PO) Take 1 tablet by mouth daily.     . nitroGLYCERIN (NITROSTAT) 0.4 MG SL tablet Place 1 tablet (0.4 mg total) under the tongue every 5 (five) minutes as needed. 25 tablet 6  . potassium chloride (MICRO-K) 10 MEQ CR capsule Take 10 mEq by mouth daily.    . ramipril (ALTACE) 10 MG capsule Take 10 mg by mouth daily.      Marland Kitchen RANEXA 500 MG 12 hr tablet Take 1 tablet (500 mg total) by mouth daily. 90 tablet 1  . VASCEPA 1 g CAPS Take 2 capsules by mouth 2 times daily with meals. triglycerides  3  . Vitamin D, Ergocalciferol, (DRISDOL) 50000 units CAPS capsule Take 1 capsule by mouth every 14 (fourteen) days.      No current facility-administered medications for this visit.     Allergies:   Codeine, Dulaglutide, Ivp dye [iodinated diagnostic agents], Metformin, Metrizamide, and Pioglitazone   Social History:  The patient  reports that she has never smoked. She has never used smokeless tobacco. She reports that she does not drink alcohol or use drugs.   Family History:  The patient's family history includes Alzheimer's disease  in her mother; Arthritis in her mother and sister; Bladder Cancer in her father; Heart attack in her maternal grandfather and maternal grandmother; Hyperlipidemia in her father and mother; Vitiligo in her sister.    ROS:  Please see the history of present illness.   Otherwise, review of systems is positive for none.   All other systems are reviewed and negative.   PHYSICAL EXAM: VS:  Ht 5\' 2"  (1.575 m)   BMI 22.15 kg/m  , BMI Body mass index is 22.15 kg/m. GEN: Well nourished, well developed, in no acute distress  HEENT: normal  Neck: no JVD, carotid bruits, or masses Cardiac: RRR; no murmurs, rubs, or gallops,no edema  Respiratory:  clear to auscultation bilaterally, normal work of breathing GI: soft, nontender, nondistended, + BS MS: no deformity or atrophy  Skin: warm and dry, device site well healed Neuro:  Strength and sensation are intact Psych: euthymic mood, full affect  EKG:  EKG is ordered today. Personal review of the ekg ordered shows A sense, V paced  Personal review of the device interrogation today. Results in [place]: No results found for requested labs within last 8760 hours.    Lipid Panel  No results found for: CHOL, TRIG, HDL, CHOLHDL, VLDL, LDLCALC, LDLDIRECT   Wt Readings from Last 3 Encounters:  09/19/18 121 lb 1.9 oz (54.9 kg)  03/04/18 124 lb 12.8 oz (56.6 kg)  10/29/17 127 lb 3.2 oz (57.7 kg)      Other studies Reviewed: Additional studies/ records that were reviewed today include: TTE 05/22/2017 Review of the above records today demonstrates:  - Left ventricle: The cavity size was normal. Systolic function was   normal. The estimated ejection fraction was in the range of 60%   to 65%. Wall motion was normal; there were no regional wall   motion abnormalities. - Aortic valve: There was mild regurgitation. Valve area (VTI):   1.83 cm^2. Valve area (Vmax): 1.72 cm^2. Valve area (Vmean): 1.71   cm^2.   ASSESSMENT AND PLAN:  1.   Chronic systolic and diastolic heart failure: Status post Saint Jude CRT-D.  Currently on optimal medical therapy with carvedilol and ramipril. Device functioning appropriately. Battery with 7 months. No changes.  2. Hypertension: well controlled  3. Hyperlipidemia: Continue atorvastatin  4. Coronary artery disease: On Plavix.  No current chest pain.  Current medicines are reviewed at length with the patient today.   The patient does not have concerns regarding her medicines.  The following changes were made today: none  Labs/ tests ordered today include:  No orders of the defined types were placed in this encounter.    Disposition:   FU with Crystalynn Mcinerney 1 year  Signed, Lonney Revak [name], MD  11/26/2018 4:26 PM     [facility] [address] [place] [address] [phone] (office) [phone] (fax)

## 2018-12-18 ENCOUNTER — Other Ambulatory Visit: Payer: Self-pay | Admitting: Cardiology

## 2018-12-18 NOTE — Telephone Encounter (Signed)
Please advise if OK to refill. Listed under Historical Provider. Thank you! 

## 2018-12-18 NOTE — Telephone Encounter (Signed)
Attempted to contact patient to confirm what dose she is taking. NO answer. Will continue efforts.

## 2018-12-25 ENCOUNTER — Other Ambulatory Visit: Payer: Self-pay | Admitting: Cardiology

## 2018-12-27 ENCOUNTER — Encounter (HOSPITAL_COMMUNITY): Payer: Medicare Other

## 2018-12-27 ENCOUNTER — Ambulatory Visit: Payer: Medicare Other | Admitting: Family

## 2018-12-31 ENCOUNTER — Other Ambulatory Visit: Payer: Self-pay | Admitting: Cardiology

## 2019-01-24 ENCOUNTER — Other Ambulatory Visit: Payer: Self-pay

## 2019-01-24 ENCOUNTER — Encounter: Payer: Self-pay | Admitting: Sports Medicine

## 2019-01-24 ENCOUNTER — Ambulatory Visit (INDEPENDENT_AMBULATORY_CARE_PROVIDER_SITE_OTHER): Payer: Medicare Other | Admitting: Sports Medicine

## 2019-01-24 DIAGNOSIS — M79675 Pain in left toe(s): Secondary | ICD-10-CM | POA: Diagnosis not present

## 2019-01-24 DIAGNOSIS — B351 Tinea unguium: Secondary | ICD-10-CM | POA: Diagnosis not present

## 2019-01-24 DIAGNOSIS — M79674 Pain in right toe(s): Secondary | ICD-10-CM | POA: Diagnosis not present

## 2019-01-24 DIAGNOSIS — E1140 Type 2 diabetes mellitus with diabetic neuropathy, unspecified: Secondary | ICD-10-CM

## 2019-01-24 DIAGNOSIS — I739 Peripheral vascular disease, unspecified: Secondary | ICD-10-CM

## 2019-01-24 NOTE — Progress Notes (Signed)
Subjective: Shari [name] is a 84 y.o. female patient with history of diabetes who returns to office today complaining of callus skin and long, painful nails  while ambulating in shoes; unable to trim. Patient states that the glucose reading this morning was 176 and last A1c was 7.5. Patient denies any new changes in medication or new problems except suffering a mini stroke in March.   Saw PCP, Dr. Sol Passer 6 months ago.  Patient Active Problem List   Diagnosis Date Noted  . Vitamin D deficiency 06/20/2016  . Elevated troponin 06/04/2016  . Hypokalemia 06/04/2016  . Precordial chest pain 06/04/2016  . Rheumatoid arthritis involving multiple sites (HCC) 05/21/2015  . Preoperative cardiovascular examination 04/02/2015  . PAD (peripheral artery disease) (HCC) 11/03/2014  . Peripheral arterial occlusive disease (HCC) 11/03/2014  . Automatic implantable cardioverter-defibrillator in situ 10/25/2014  . 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 Chronic combined systolic and diastolic heart failure (HCC) 10/25/2014  . Costochondritis 10/25/2014  . Mild CAD 10/25/2014  . Hyperlipidemia 11/10/2010  . Hypertensive heart and kidney disease with heart failure and with chronic kidney disease stage III (HCC) 11/10/2010  . ibs 11/10/2010  . Osteoarthritis 11/10/2010  . Diverticulitis 11/10/2010  . GERD (gastroesophageal reflux disease) 11/10/2010  . Glaucoma 11/10/2010  . Diabetes mellitus 11/10/2010  . Onychomycosis 11/10/2010   Current Outpatient Medications on File Prior to Visit  Medication Sig Dispense Refill  . Acetaminophen (TYLENOL ARTHRITIS PAIN PO) Take 2 tablets by mouth 2 (two) times daily. Reported on 07/07/2015    . ALPRAZolam (XANAX) 0.25 MG tablet Take 1 tablet (0.25 mg total) by mouth daily as needed for anxiety. 30 tablet 0  . Ascorbic Acid (VITAMIN C) 1000 MG tablet Take 1,000 mg by mouth daily.    Marland Kitchen atorvastatin (LIPITOR) 20 MG tablet Take 20  mg by mouth daily.    . Calcium Carbonate-Vit D-Min (CALCIUM 1200 PO) Take 1 tablet by mouth daily.     . carvedilol (COREG) 25 MG tablet Take 12.5 mg by mouth 2 (two) times daily with a meal.     . clopidogrel (PLAVIX) 75 MG tablet TAKE ONE TABLET BY MOUTH EVERY DAY 90 tablet 2  . DEXILANT 60 MG capsule Take 60 mg by mouth daily.     . furosemide (LASIX) 80 MG tablet Take 2 tablets (160 mg total) by mouth 2 (two) times daily. 360 tablet 0  . glipiZIDE (GLUCOTROL) 10 MG tablet Take 10 mg by mouth 2 (two) times daily before a meal.     . isosorbide mononitrate (IMDUR) 60 MG 24 hr tablet Take 1 tablet (60 mg total) by mouth daily. 90 tablet 1  . JANUVIA 100 MG tablet Take 50 mg by mouth daily.   3  . montelukast (SINGULAIR) 10 MG tablet Take 10 mg by mouth daily as needed for cough.    . Multiple Vitamin (MULTIVITAMIN PO) Take 1 tablet by mouth daily.     . nitroGLYCERIN (NITROSTAT) 0.4 MG SL tablet Place 1 tablet (0.4 mg total) under the tongue every 5 (five) minutes as needed. 25 tablet 6  . potassium chloride (MICRO-K) 10 MEQ CR capsule Take 10 mEq by mouth daily.    . ramipril (ALTACE) 10 MG capsule Take 10 mg by mouth daily.      . ranolazine (RANEXA) 500 MG 12 hr tablet Take 500 mg by mouth 2 (two) times daily.    Marland Kitchen VASCEPA 1 g CAPS Take 2 capsules by mouth 2  times daily with meals. triglycerides  3  . Vitamin D, Ergocalciferol, (DRISDOL) 50000 units CAPS capsule Take 1 capsule by mouth every 14 (fourteen) days.      No current facility-administered medications on file prior to visit.    Allergies  Allergen Reactions  . Codeine Diarrhea, Nausea And Vomiting, Other (See Comments) and Hives    All-over body aches, too Other reaction(s): GI intolerance  . Dulaglutide Other (See Comments)    Chest pain, N/V  . Ivp Dye [Iodinated Diagnostic Agents] Nausea And Vomiting and Other (See Comments)  . Metformin Other (See Comments)  . Metrizamide Nausea And Vomiting and Other (See Comments)  .  Pioglitazone Other (See Comments)    Has CHF    Recent Results (from the past 2160 hour(s))  CUP PACEART REMOTE DEVICE CHECK     Status: None   Collection Time: 11/11/18  7:41 AM  Result Value Ref Range   Pulse Generator Manufacturer [employer]    Date Time Interrogation Session [id number]    Pulse Gen Model 3365-40Q Dillon Bjork    Pulse Gen Serial Number [id number]    Clinic Name [facility]    Implantable Pulse Generator Type Cardiac Resynch Therapy Defibulator    Implantable Pulse Generator Implant Date 28003491    Implantable Lead Manufacturer [facility]    Implantable Lead Model 1458Q Quartet    Implantable Lead Serial Number [id number]    Implantable Lead Implant Date 79150569    Implantable Lead Location Detail 1 UNKNOWN    Implantable Lead Location [id number]    Implantable Lead Manufacturer [employer]    Implantable Lead Model [id number] Tendril STS    Implantable Lead Serial Number [id number]    Implantable Lead Implant Date 79480165    Implantable Lead Location Detail 1 UNKNOWN    Implantable Lead Location [id number]    Implantable Lead Manufacturer [facility]    Implantable Lead Model C1931474 Durata    Implantable Lead Serial Number [id number]    Implantable Lead Implant Date 53748270    Implantable Lead Location Detail 1 UNKNOWN    Implantable Lead Location [id number]     Objective: General: Patient is awake, alert, and oriented x 3 and in no acute distress.  Integument: Skin is warm, dry and supple bilateral. Nails are tender, long, thickened and dystrophic with subungual debris, consistent with onychomycosis, 2-5 bilateral. There are no nails on bilateral hallux, or very limited nail due to previous nail procedure bilateral There is mild callus at the right hallux and medial first metatarsophalangeal joint with surrounding dry skin with no signs of infection. No open lesions present bilateral. Remaining integument unremarkable.  Vasculature: Dorsalis Pedis pulse 1/4 bilateral. Posterior Tibial pulse  1/4 bilateral. Capillary fill time <5 sec 1-5 bilateral. Scant hair growth to the level of the digits.Temperature gradient within normal limits. Mild varicosities present bilateral. No edema present bilateral.   Neurology: The patient has slightly diminished sensation measured with a 5.07/10g Semmes Weinstein Monofilament at all pedal sites bilateral . Vibratory sensation diminished bilateral with tuning fork. No Babinski sign present bilateral.   Musculoskeletal: Asymptomatic mild hammertoes pedal deformities noted bilateral. Muscular strength 5/5 in all lower extremity muscular groups bilateral without pain on range of motion . No tenderness with calf compression bilateral.  Assessment and Plan: Problem List Items Addressed This Visit      Cardiovascular and Mediastinum   PAD (peripheral artery disease) (HCC)    Other Visit Diagnoses    Pain due to onychomycosis of toenails of both feet    -  Primary   Type 2 diabetes, controlled, with neuropathy ([place])         -Examined patient. -Discussed and educated patient on diabetic foot care, especially with  regards to the vascular, neurological and musculoskeletal systems.  -Mechanically debrided all nails bilateral using sterile nail nipper and filed with dremel without incident & smoothed callus2 on right foot using rotary bur without incident.   -Diabetic shoes; office to contact primary care for approval / certification;  Office to arrange shoe fitting and dispensing. -Patient to return  in 3 months for at risk foot care -Patient advised to call the office if any problems or questions arise in the meantime.  Landis Martins, DPM

## 2019-01-27 ENCOUNTER — Other Ambulatory Visit: Payer: Self-pay

## 2019-01-27 DIAGNOSIS — I739 Peripheral vascular disease, unspecified: Secondary | ICD-10-CM

## 2019-01-27 DIAGNOSIS — I7409 Other arterial embolism and thrombosis of abdominal aorta: Secondary | ICD-10-CM

## 2019-01-28 ENCOUNTER — Telehealth (HOSPITAL_COMMUNITY): Payer: Self-pay | Admitting: *Deleted

## 2019-01-28 NOTE — Telephone Encounter (Signed)

## 2019-01-29 ENCOUNTER — Ambulatory Visit (HOSPITAL_COMMUNITY)
Admission: RE | Admit: 2019-01-29 | Discharge: 2019-01-29 | Disposition: A | Discharge status: Home / Self Care | Payer: Medicare Other | Source: Ambulatory Visit | Attending: Family | Admitting: Family

## 2019-01-29 ENCOUNTER — Other Ambulatory Visit: Payer: Self-pay

## 2019-01-29 ENCOUNTER — Ambulatory Visit (INDEPENDENT_AMBULATORY_CARE_PROVIDER_SITE_OTHER): Payer: Medicare Other | Admitting: Family

## 2019-01-29 ENCOUNTER — Ambulatory Visit (INDEPENDENT_AMBULATORY_CARE_PROVIDER_SITE_OTHER)
Admission: RE | Admit: 2019-01-29 | Discharge: 2019-01-29 | Disposition: A | Discharge status: Home / Self Care | Payer: Medicare Other | Source: Ambulatory Visit | Attending: Family | Admitting: Family

## 2019-01-29 ENCOUNTER — Encounter: Payer: Self-pay | Admitting: Family

## 2019-01-29 VITALS — BP 129/67 | HR 71 | Temp 97.8°F | Resp 14 | Ht 62.0 in | Wt 118.0 lb

## 2019-01-29 DIAGNOSIS — I7409 Other arterial embolism and thrombosis of abdominal aorta: Secondary | ICD-10-CM

## 2019-01-29 DIAGNOSIS — Z8673 Personal history of transient ischemic attack (TIA), and cerebral infarction without residual deficits: Secondary | ICD-10-CM

## 2019-01-29 DIAGNOSIS — I739 Peripheral vascular disease, unspecified: Secondary | ICD-10-CM | POA: Insufficient documentation

## 2019-01-29 DIAGNOSIS — Z959 Presence of cardiac and vascular implant and graft, unspecified: Secondary | ICD-10-CM | POA: Diagnosis not present

## 2019-01-29 DIAGNOSIS — E1151 Type 2 diabetes mellitus with diabetic peripheral angiopathy without gangrene: Secondary | ICD-10-CM

## 2019-01-29 NOTE — Progress Notes (Addendum)
[facility] [place]   CC: Follow up peripheral artery occlusive disease  History of Present Illness Shari [name] is a 84 y.o. female who is status post insertion of aortic stent and left common iliac stent by Dr. Imogene Burn in August of 2016.This was done for life limiting claudication of both legs.  Dr. Hart Rochester had seen the patient with bilateral lower extremity claudication symptoms and suspected aortic and iliac disease. She states that after the procedure she was completely free of claudication symptoms in both legs. There had been some question about whether she had neurogenic claudication and that has now been ruled out.  She had right hand surgery in August 2017 for carpal tunnel syndrome. Her walking does not seem to be limited by her arthritis pain. She is seeing a rheumatologist.   She sees Dr. Dulce Sellar in [place] for CHF, has a Facilities manager.   She sees Dr. Janit Pagan, nephrologist with [facility] in [facility]; pt states she was told that she has remained stable with her CKD, she is unsure of stage, possibly stage 3; no GFR result on file, only serum creatinine result on file was 1.1 on 11-05-14.   She had a TIA in 2013 and again in March 2020; she was evaluated at a hospital in [place] at that time, admitted for 3 days. She states that Dr. Dulce Sellar requested a carotid duplex, performed at [facility], that showed no blockages (note that no carotid duplex results on file in [facility]).   After walking about 1/2 mile, both legs feel tired.  She is walking almost 30 minutes total, rarely lately, she is caregiver for her husband.   She states that she has painful arthritis in her hands and hips.   Diabetic: Yes, pt states last A1C was 7.5, increased from the previous6.8, states she is working closely with her PCP re this Tobacco use: non-smoker  Pt meds include:  Statin :Yes Betablocker: Yes ASA: No Takes Plavix   Past  Medical History:  Diagnosis Date  . Anxiety   . Automatic implantable cardioverter-defibrillator in situ 10/25/2014   Overview:  Overview:  dual chamber ICD,upgraded to CRT with LBBB and nonischemic cardiomyopathy  . Biventricular ICD (implantable cardioverter-defibrillator) in place 10/25/2014   Overview:  BIV -ICD,upgraded to CRT with LBBB and nonischemic cardiomyopathy  . Chronic coronary artery disease 10/25/2014  . Costochondritis 10/25/2014  . Diabetes mellitus 2 years  . Diverticulitis   . Elevated troponin 06/04/2016   Last Assessment & Plan:  Likely may be related to ventricular pacing and underlying cardiomyopathy.  The trajectory of the biomarker elevation and neither is her clinical history suggestive of acute coronary syndrome.  Further stratification with nuclear stress testing  . GERD (gastroesophageal reflux disease)   . Glaucoma   . High cholesterol   . Hypertension   . Hypertensive heart disease with heart failure (HCC) 10/25/2014  . Hypokalemia 06/04/2016   Last Assessment & Plan:  Replace with potassium chloride.  . IBS (irritable bowel syndrome)   . 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 Mild CAD 10/25/2014  . Onychomycosis 11/10/2010  . Osteoarthritis   . PAD (peripheral artery disease) (HCC) 11/03/2014   Overview:  status post insertion of aortic stent and left common iliac stent by Dr. Imogene Burn in August of 2016  . Peripheral arterial occlusive disease (HCC) 11/03/2014  . Precordial chest pain 06/04/2016   Last Assessment & Plan:  My suspicion is that this is likely  related to gastroesophageal reflux disease with acute indigestion last evening.  The biomarker elevation is not completely unexpected in this elderly patient with reported cardiomyopathy and current demand ventricular pacing.  Nonetheless she does have a history of extensive vascular disease (though apparently no significant coronary atherosclerosis in 2013) so we will proceed with a  noninvasive risk stratification with pharmacological nuclear stress testing.  Assuming this is a low risk or normal study then I would not advise any invasive cardiac evaluation at this time.  . Preoperative cardiovascular examination 04/02/2015  . Rheumatoid arthritis involving multiple sites ([place]) 05/21/2015  . Stroke ([place])   . Vitamin D deficiency 06/20/2016    Social History Social History   Tobacco Use  . Smoking status: Never Smoker  . Smokeless tobacco: Never Used  Substance Use Topics  . Alcohol use: No    Alcohol/week: 0.0 standard drinks  . Drug use: No    Family History Family History  Problem Relation Age of Onset  . Heart attack Maternal Grandmother   . Heart attack Maternal Grandfather   . Alzheimer's disease Mother   . Hyperlipidemia Mother   . Arthritis Mother   . Bladder Cancer Father   . Hyperlipidemia Father   . Vitiligo Sister   . Arthritis Sister     Past Surgical History:  Procedure Laterality Date  . FOOT SURGERY    . pacemaker surgery     a-fib and was done july 2014  . PERIPHERAL VASCULAR CATHETERIZATION N/A 11/05/2014   Procedure: Abdominal Aortogram;  Surgeon: Conrad , MD;  Location: [facility];  Service: Cardiovascular;  Laterality: N/A;    Allergies  Allergen Reactions  . Codeine Diarrhea, Nausea And Vomiting, Other (See Comments) and Hives    All-over body aches, too Other reaction(s): GI intolerance  . Dulaglutide Other (See Comments)    Chest pain, N/V  . Ivp Dye [Iodinated Diagnostic Agents] Nausea And Vomiting and Other (See Comments)  . Metformin Other (See Comments)  . Metrizamide Nausea And Vomiting and Other (See Comments)  . Pioglitazone Other (See Comments)    Has CHF    Current Outpatient Medications  Medication Sig Dispense Refill  . Acetaminophen (TYLENOL ARTHRITIS PAIN PO) Take 2 tablets by mouth 2 (two) times daily. Reported on 07/07/2015    . ALPRAZolam (XANAX) 0.25 MG tablet Take 1 tablet (0.25 mg total)  by mouth daily as needed for anxiety. 30 tablet 0  . Ascorbic Acid (VITAMIN C) 1000 MG tablet Take 1,000 mg by mouth daily.    Marland Kitchen atorvastatin (LIPITOR) 20 MG tablet Take 20 mg by mouth daily.    . Calcium Carbonate-Vit D-Min (CALCIUM 1200 PO) Take 1 tablet by mouth daily.     . carvedilol (COREG) 25 MG tablet Take 12.5 mg by mouth 2 (two) times daily with a meal.     . clopidogrel (PLAVIX) 75 MG tablet TAKE ONE TABLET BY MOUTH EVERY DAY 90 tablet 2  . DEXILANT 60 MG capsule Take 60 mg by mouth daily.     . furosemide (LASIX) 80 MG tablet Take 2 tablets (160 mg total) by mouth 2 (two) times daily. 360 tablet 0  . glipiZIDE (GLUCOTROL) 10 MG tablet Take 10 mg by mouth 2 (two) times daily before a meal.     . isosorbide mononitrate (IMDUR) 60 MG 24 hr tablet Take 1 tablet (60 mg total) by mouth daily. 90 tablet 1  . JANUVIA 100 MG tablet Take 50 mg by mouth daily.  3  . montelukast (SINGULAIR) 10 MG tablet Take 10 mg by mouth daily as needed for cough.    . Multiple Vitamin (MULTIVITAMIN PO) Take 1 tablet by mouth daily.     . nitroGLYCERIN (NITROSTAT) 0.4 MG SL tablet Place 1 tablet (0.4 mg total) under the tongue every 5 (five) minutes as needed. 25 tablet 6  . potassium chloride (MICRO-K) 10 MEQ CR capsule Take 10 mEq by mouth daily.    . ramipril (ALTACE) 10 MG capsule Take 10 mg by mouth daily.      . ranolazine (RANEXA) 500 MG 12 hr tablet Take 500 mg by mouth 2 (two) times daily.    Marland Kitchen. VASCEPA 1 g CAPS Take 2 capsules by mouth 2 times daily with meals. triglycerides  3  . Vitamin D, Ergocalciferol, (DRISDOL) 50000 units CAPS capsule Take 1 capsule by mouth every 14 (fourteen) days.      No current facility-administered medications for this visit.     ROS: See HPI for pertinent positives and negatives.   Physical Examination  Vitals:   01/29/19 0954  BP: 129/67  Pulse: 71  Resp: 14  Temp: 97.8 F (36.6 C)  TempSrc: Temporal  SpO2: 97%  Weight: 118 lb (53.5 kg)  Height: 5\' 2"   (1.575 m)   Body mass index is 21.58 kg/m.  General: A&O x 3, WDWN, petite elderly female in NAD. Gait: normal HENT: No gross abnormalities.  Eyes: Pupils are equal and round Pulmonary: Respirations are non labored, CTAB, good air movement in all fields Cardiac: regular rhythm, no detected murmur.         Carotid Bruits Right Left   Negative Negative   Radial pulses are 2+ palpable bilaterally   Adominal aortic pulse is not palpable                         VASCULAR EXAM: Extremities without ischemic changes, without Gangrene; without open wounds.                                                                                                          LE Pulses Right Left       FEMORAL  2+ palpable  2+ palpable        POPLITEAL  2+ palpable   2+ palpable       POSTERIOR TIBIAL  not palpable   not palpable        DORSALIS PEDIS      ANTERIOR TIBIAL 2+ palpable  2+ palpable    Abdomen: soft, NT, no palpable masses. Skin: no rashes, no cellulitis, no ulcers noted. Musculoskeletal: no muscle wasting or atrophy. Arthritic deformities in both hands and feet.  Neurologic: A&O X 3; appropriate affect, Sensation is normal; MOTOR FUNCTION:  moving all extremities equally, motor strength 5/5 in lower extremities, 3-4/5 in upper extremities. Speech is fluent/normal. CN 2-12 intact. Psychiatric: Thought content is normal, mood appropriate for clinical situation.     DATA  Left Aortoiliac Duplex (01-29-19):  Abdominal Aorta Findings: +--------+-------+----------+----------+--------+--------+--------+ LocationAP (  cm)Trans (cm)PSV (cm/s)WaveformThrombusComments +--------+-------+----------+----------+--------+--------+--------+ Proximal                 152                                +--------+-------+----------+----------+--------+--------+--------+ Mid                      220                                 +--------+-------+----------+----------+--------+--------+--------+ Distal                   153                                +--------+-------+----------+----------+--------+--------+--------+  Left Stent(s): +---------------+---++--------++ Prox to Stent  153biphasic +---------------+---++--------++ Proximal Stent 177biphasic +---------------+---++--------++ Mid Stent      140biphasic +---------------+---++--------++ Distal Stent   107biphasic +---------------+---++--------++ Distal to Stent118biphasic +---------------+---++--------++ Summary: Aortic stent could not be precisely identified; however, peak systolic velocities within the aorta of 220 cm/s consistent with >50% stenosis. Patent left common iliac artery stent. No significant change compared to the exam on 08-07-17.   Left Aortoiliac Duplex (08/07/17): Aortic stent could not be precisely identified, however, PSV within the aorta increase from 92 cm/s to 263 cm/s, c/w >50% stenosis, all biphasic waveforms. Left CIA stent: no stenosis, tri and biphasic waveforms.    ABI Findings (01-29-19): +---------+------------------+-----+--------+--------+ Right    Rt Pressure (mmHg)IndexWaveformComment  +---------+------------------+-----+--------+--------+ Brachial 191                                     +---------+------------------+-----+--------+--------+ PTA      170               0.88 biphasic         +---------+------------------+-----+--------+--------+ DP       175               0.91 biphasic         +---------+------------------+-----+--------+--------+ Great Toe109               0.56                  +---------+------------------+-----+--------+--------+  +---------+------------------+-----+--------+-------+ Left     Lt Pressure (mmHg)IndexWaveformComment +---------+------------------+-----+--------+-------+ Brachial 193                                     +---------+------------------+-----+--------+-------+ PTA      171               0.89 biphasic        +---------+------------------+-----+--------+-------+ DP       163               0.84 biphasic        +---------+------------------+-----+--------+-------+ Great Toe124               0.64                 +---------+------------------+-----+--------+-------+  +-------+-----------+-----------+------------+------------+ ABI/TBIToday's ABIToday's TBIPrevious ABIPrevious TBI +-------+-----------+-----------+------------+------------+ Right  0.91       0.56       0.87  0.39         +-------+-----------+-----------+------------+------------+ Left   0.89       0.64       0.94        0.68         +-------+-----------+-----------+------------+------------+ Summary: Right: Resting right ankle-brachial index indicates mild right lower extremity arterial disease. The right toe-brachial index is abnormal. Left: Resting left ankle-brachial index indicates mild left lower extremity arterial disease. The left toe-brachial index is abnormal.    ABI (Date: 08/07/2017):  R:  ? ABI: 0.87 (was 1.13 on 05-10-16),  ? PT: tri ? DP: tri ? TBI:  0.39 (was 0.86)  L:  ? ABI: 0.94 (was 1.12),  ? PT: tri ? DP: tri ? TBI: 0.68 (was 0.82)  Decline in bilateral ABI and TBI, waveforms remain triphasic, mild disease bilaterally.    ASSESSMENT: Shari [name] is a 84 y.o. female who is status post insertion of aortic stent and left common iliac stent by Dr. Imogene Burnhen in August of 2016.  Dr. Hart RochesterLawson had seen the patient with bilateral lower extremity claudication symptoms and suspected aortic and iliac disease. After the procedure she was completely free of claudication symptoms in both legs. There had been some question about whether she had neurogenic claudication and that has now been ruled out.  Both legs feel tired after walking about 1/2 mile,  but she admits to not walking as much. She has no signs of ischemia in eitherlower extremity. Bilateral DP pulses are 2+ palpable.   Left aortoiliac duplex today shows no significant stenosis, stable compared to the exam on 08-07-17. ABI's today show mild disease bilaterally, stable compared to the exam on 08-07-17, except waveforms today are all biphasic, were all triphasic.  Fortunately she has never used tobacco. Her primary atherosclerotic risk factor is her DM.  She has some positional dizziness and states she is scheduled to see an ENT to evaluate this. She states that she may have had a TIA in 2013 and again in March of 2020; no carotid duplex result on file; will obtain carotid duplex when she returns in a year.   She takes a daily statin and Plavix.    PLAN:  Graduated walking program discussed and how to achieve safely.  Based on the patient's vascular studies and examination, pt will return to clinic in1 yearwith ABI's, left aortoiliac duplex, and carotid duplex. I advised her to notify us if she develops concerns re the circulation in her feet or legs.  I discussed in depth with the patient the nature of atherosclerosis, and emphasized the importance of maximal medical management including strict control of blood pressure, blood glucose, and lipid levels, obtaining regular exercise, and continued cessation of smoking.  The patient is aware that without maximal medical management the underlying atherosclerotic disease process will progress, limiting the benefit of any interventions.  The patient was given information about PAD including signs, symptoms, treatment, what symptoms should prompt the patient to seek immediate medical care, and risk reduction measures to take.  Charisse MarchSuzanne Roshawna Colclasure, RN, MSN, FNP-C [facility] [place] Office Phone: [phone]  Clinic MD: Edilia BoDickson  01/29/19 10:37 AM

## 2019-01-29 NOTE — Patient Instructions (Signed)
Before your next abdominal ultrasound:  Avoid gas forming foods and beverages the day before the test.   Take two Extra-Strength Gas-X capsules at bedtime the night before the test. Take another two Extra-Strength Gas-X capsules in the middle of the night if you get up to the restroom, if not, first thing in the morning with water.  Do not chew gum.      Peripheral Vascular Disease  Peripheral vascular disease (PVD) is a disease of the blood vessels that are not part of your heart and brain. A simple term for PVD is poor circulation. In most cases, PVD narrows the blood vessels that carry blood from your heart to the rest of your body. This can reduce the supply of blood to your arms, legs, and internal organs, like your stomach or kidneys. However, PVD most often affects a person's lower legs and feet. Without treatment, PVD tends to get worse. PVD can also lead to acute ischemic limb. This is when an arm or leg suddenly cannot get enough blood. This is a medical emergency. Follow these instructions at home: Lifestyle  Do not use any products that contain nicotine or tobacco, such as cigarettes and e-cigarettes. If you need help quitting, ask your doctor.  Lose weight if you are overweight. Or, stay at a healthy weight as told by your doctor.  Eat a diet that is low in fat and cholesterol. If you need help, ask your doctor.  Exercise regularly. Ask your doctor for activities that are right for you. General instructions  Take over-the-counter and prescription medicines only as told by your doctor.  Take good care of your feet: ? Wear comfortable shoes that fit well. ? Check your feet often for any cuts or sores.  Keep all follow-up visits as told by your doctor This is important. Contact a doctor if:  You have cramps in your legs when you walk.  You have leg pain when you are at rest.  You have coldness in a leg or foot.  Your skin changes.  You are unable to get or have an  erection (erectile dysfunction).  You have cuts or sores on your feet that do not heal. Get help right away if:  Your arm or leg turns cold, numb, and blue.  Your arms or legs become red, warm, swollen, painful, or numb.  You have chest pain.  You have trouble breathing.  You suddenly have weakness in your face, arm, or leg.  You become very confused or you cannot speak.  You suddenly have a very bad headache.  You suddenly cannot see. Summary  Peripheral vascular disease (PVD) is a disease of the blood vessels.  A simple term for PVD is poor circulation. Without treatment, PVD tends to get worse.  Treatment may include exercise, low fat and low cholesterol diet, and quitting smoking. This information is not intended to replace advice given to you by your health care provider. Make sure you discuss any questions you have with your health care provider. Document Released: 06/14/2009 Document Revised: 03/02/2017 Document Reviewed: 04/27/2016 Elsevier Patient Education  2020 [employer]  

## 2019-02-05 LAB — CUP PACEART INCLINIC DEVICE CHECK
Battery Remaining Longevity: 7 mo
Brady Statistic RA Percent Paced: 0.03 %
Brady Statistic RV Percent Paced: 99.91 %
Date Time Interrogation Session: 20200825204150
HighPow Impedance: 63 Ohm
Implantable Lead Implant Date: 20140702
Implantable Lead Implant Date: 20140702
Implantable Lead Implant Date: 20150730
Implantable Lead Location: 753858
Implantable Lead Location: 753859
Implantable Lead Location: 753860
Implantable Lead Model: 7122
Implantable Pulse Generator Implant Date: 20150730
Lead Channel Impedance Value: 325 Ohm
Lead Channel Impedance Value: 462.5 Ohm
Lead Channel Impedance Value: 512.5 Ohm
Lead Channel Pacing Threshold Amplitude: 1 V
Lead Channel Pacing Threshold Amplitude: 1 V
Lead Channel Pacing Threshold Amplitude: 2.75 V
Lead Channel Pacing Threshold Pulse Width: 0.5 ms
Lead Channel Pacing Threshold Pulse Width: 0.5 ms
Lead Channel Pacing Threshold Pulse Width: 1 ms
Lead Channel Sensing Intrinsic Amplitude: 11.7 mV
Lead Channel Sensing Intrinsic Amplitude: 2.9 mV
Lead Channel Setting Pacing Amplitude: 2 V
Lead Channel Setting Pacing Amplitude: 2 V
Lead Channel Setting Pacing Amplitude: 3.25 V
Lead Channel Setting Pacing Pulse Width: 0.5 ms
Lead Channel Setting Pacing Pulse Width: 1 ms
Lead Channel Setting Sensing Sensitivity: 0.5 mV
Pulse Gen Serial Number: 7147055

## 2019-02-10 ENCOUNTER — Ambulatory Visit (INDEPENDENT_AMBULATORY_CARE_PROVIDER_SITE_OTHER): Payer: Medicare Other | Admitting: *Deleted

## 2019-02-10 DIAGNOSIS — I5042 Chronic combined systolic (congestive) and diastolic (congestive) heart failure: Secondary | ICD-10-CM

## 2019-02-11 LAB — CUP PACEART REMOTE DEVICE CHECK
Battery Remaining Longevity: 4 mo
Battery Remaining Percentage: 7 %
Battery Voltage: 2.65 V
Brady Statistic AP VP Percent: 1 %
Brady Statistic AP VS Percent: 1 %
Brady Statistic AS VP Percent: 99 %
Brady Statistic AS VS Percent: 1 %
Brady Statistic RA Percent Paced: 1 %
Date Time Interrogation Session: 20201109070017
HighPow Impedance: 64 Ohm
HighPow Impedance: 64 Ohm
Implantable Lead Implant Date: 20140702
Implantable Lead Implant Date: 20140702
Implantable Lead Implant Date: 20150730
Implantable Lead Location: 753858
Implantable Lead Location: 753859
Implantable Lead Location: 753860
Implantable Lead Model: 7122
Implantable Pulse Generator Implant Date: 20150730
Lead Channel Impedance Value: 330 Ohm
Lead Channel Impedance Value: 440 Ohm
Lead Channel Impedance Value: 490 Ohm
Lead Channel Pacing Threshold Amplitude: 1 V
Lead Channel Pacing Threshold Amplitude: 1 V
Lead Channel Pacing Threshold Amplitude: 2.75 V
Lead Channel Pacing Threshold Pulse Width: 0.5 ms
Lead Channel Pacing Threshold Pulse Width: 0.5 ms
Lead Channel Pacing Threshold Pulse Width: 1 ms
Lead Channel Sensing Intrinsic Amplitude: 11.7 mV
Lead Channel Sensing Intrinsic Amplitude: 2.1 mV
Lead Channel Setting Pacing Amplitude: 2 V
Lead Channel Setting Pacing Amplitude: 2 V
Lead Channel Setting Pacing Amplitude: 3.25 V
Lead Channel Setting Pacing Pulse Width: 0.5 ms
Lead Channel Setting Pacing Pulse Width: 1 ms
Lead Channel Setting Sensing Sensitivity: 0.5 mV
Pulse Gen Serial Number: 7147055

## 2019-02-12 ENCOUNTER — Other Ambulatory Visit: Payer: Self-pay

## 2019-02-12 ENCOUNTER — Ambulatory Visit: Payer: Medicare Other | Admitting: Orthotics

## 2019-02-12 DIAGNOSIS — M79674 Pain in right toe(s): Secondary | ICD-10-CM

## 2019-02-12 DIAGNOSIS — I739 Peripheral vascular disease, unspecified: Secondary | ICD-10-CM

## 2019-02-12 DIAGNOSIS — B351 Tinea unguium: Secondary | ICD-10-CM

## 2019-02-12 DIAGNOSIS — Q828 Other specified congenital malformations of skin: Secondary | ICD-10-CM

## 2019-02-12 NOTE — Progress Notes (Signed)
Patient couldn't decide what kind of shoe she wanted, then when I told her of a possible co-pay, she says she would wait.

## 2019-02-25 DIAGNOSIS — E1142 Type 2 diabetes mellitus with diabetic polyneuropathy: Secondary | ICD-10-CM

## 2019-02-25 HISTORY — DX: Type 2 diabetes mellitus with diabetic polyneuropathy: E11.42

## 2019-03-11 ENCOUNTER — Other Ambulatory Visit: Payer: Self-pay | Admitting: Cardiology

## 2019-03-12 NOTE — Progress Notes (Signed)
Remote ICD transmission.   

## 2019-03-12 NOTE — Addendum Note (Signed)
Addended by: Douglass Rivers D on: 03/12/2019 02:32 PM   Modules accepted: Level of Service

## 2019-03-13 ENCOUNTER — Ambulatory Visit (INDEPENDENT_AMBULATORY_CARE_PROVIDER_SITE_OTHER): Payer: Medicare Other | Admitting: *Deleted

## 2019-03-13 DIAGNOSIS — Z95810 Presence of automatic (implantable) cardiac defibrillator: Secondary | ICD-10-CM

## 2019-03-13 LAB — CUP PACEART REMOTE DEVICE CHECK
Battery Remaining Longevity: 3 mo
Battery Remaining Percentage: 6 %
Battery Voltage: 2.63 V
Brady Statistic AP VP Percent: 1 %
Brady Statistic AP VS Percent: 1 %
Brady Statistic AS VP Percent: 99 %
Brady Statistic AS VS Percent: 1 %
Brady Statistic RA Percent Paced: 1 %
Date Time Interrogation Session: 20201210033234
HighPow Impedance: 65 Ohm
HighPow Impedance: 65 Ohm
Implantable Lead Implant Date: 20140702
Implantable Lead Implant Date: 20140702
Implantable Lead Implant Date: 20150730
Implantable Lead Location: 753858
Implantable Lead Location: 753859
Implantable Lead Location: 753860
Implantable Lead Model: 7122
Implantable Pulse Generator Implant Date: 20150730
Lead Channel Impedance Value: 340 Ohm
Lead Channel Impedance Value: 460 Ohm
Lead Channel Impedance Value: 490 Ohm
Lead Channel Pacing Threshold Amplitude: 1 V
Lead Channel Pacing Threshold Amplitude: 1 V
Lead Channel Pacing Threshold Amplitude: 2.75 V
Lead Channel Pacing Threshold Pulse Width: 0.5 ms
Lead Channel Pacing Threshold Pulse Width: 0.5 ms
Lead Channel Pacing Threshold Pulse Width: 1 ms
Lead Channel Sensing Intrinsic Amplitude: 11.7 mV
Lead Channel Sensing Intrinsic Amplitude: 3.1 mV
Lead Channel Setting Pacing Amplitude: 2 V
Lead Channel Setting Pacing Amplitude: 2 V
Lead Channel Setting Pacing Amplitude: 3.25 V
Lead Channel Setting Pacing Pulse Width: 0.5 ms
Lead Channel Setting Pacing Pulse Width: 1 ms
Lead Channel Setting Sensing Sensitivity: 0.5 mV
Pulse Gen Serial Number: 7147055

## 2019-03-19 ENCOUNTER — Other Ambulatory Visit: Payer: Self-pay | Admitting: Cardiology

## 2019-03-20 ENCOUNTER — Ambulatory Visit (INDEPENDENT_AMBULATORY_CARE_PROVIDER_SITE_OTHER): Payer: Medicare Other | Admitting: Cardiology

## 2019-03-20 ENCOUNTER — Other Ambulatory Visit: Payer: Self-pay

## 2019-03-20 ENCOUNTER — Encounter: Payer: Self-pay | Admitting: Cardiology

## 2019-03-20 VITALS — BP 140/64 | HR 76 | Ht 62.0 in | Wt 120.0 lb

## 2019-03-20 DIAGNOSIS — I5042 Chronic combined systolic (congestive) and diastolic (congestive) heart failure: Secondary | ICD-10-CM

## 2019-03-20 DIAGNOSIS — Z95810 Presence of automatic (implantable) cardiac defibrillator: Secondary | ICD-10-CM

## 2019-03-20 DIAGNOSIS — N183 Chronic kidney disease, stage 3 unspecified: Secondary | ICD-10-CM

## 2019-03-20 DIAGNOSIS — I130 Hypertensive heart and chronic kidney disease with heart failure and stage 1 through stage 4 chronic kidney disease, or unspecified chronic kidney disease: Secondary | ICD-10-CM | POA: Diagnosis not present

## 2019-03-20 DIAGNOSIS — R5381 Other malaise: Secondary | ICD-10-CM | POA: Diagnosis not present

## 2019-03-20 DIAGNOSIS — R42 Dizziness and giddiness: Secondary | ICD-10-CM

## 2019-03-20 MED ORDER — MECLIZINE HCL 12.5 MG PO TABS: ORAL_TABLET | ORAL | 1 refills | Status: DC

## 2019-03-20 MED ORDER — METOPROLOL SUCCINATE ER 25 MG PO TB24: 25.0000 mg | ORAL_TABLET | Freq: Every day | ORAL | 5 refills | Status: DC

## 2019-03-20 NOTE — Progress Notes (Signed)
Cardiology Office Note:    Date:  03/20/2019   ID:  Shari [name], DOB [birth year], MRN [id number]  PCP:  Algis Greenhouse, MD  Cardiologist:  Shirlee More, MD    Referring MD: Algis Greenhouse, MD    ASSESSMENT:    1. Chronic combined systolic and diastolic heart failure ([place])   2. Biventricular ICD (implantable cardioverter-defibrillator) in place   3. Hypertensive heart and kidney disease with heart failure and with chronic kidney disease stage III ([place])   4. Malaise   5. Dizzy spells    PLAN:    In order of problems listed above:  1. Stable compensated New York Heart Association class I she has no fluid overload continue her current diuretic along with guideline therapy including beta-blocker switch from carvedilol to Toprol 2. Approaching ERI followed in our device clinic 3. Stable continue current antihypertensives switching beta-blockers 4. Secondary to lipophilic carvedilol switch to metoprolol 12 point preparation 5. Antivert as needed 6. Dyslipidemia stable continue statin and Vascepa   Next appointment: 6 months   Medication Adjustments/Labs and Tests Ordered: Current medicines are reviewed at length with the patient today.  Concerns regarding medicines are outlined above.  No orders of the defined types were placed in this encounter.  No orders of the defined types were placed in this encounter.   Chief Complaint  Patient presents with  . Follow-up    6 month and pacemaker   . Congestive Heart Failure    History of Present Illness:    Shari [name] is a 84 y.o. female with a hx of LBBB, CHF EF 77% 06/04/16 s/p CRT-D, DLD, HTN, CKD3, PAD, mild coronary atherosclerosis, costal chondritis, DM2  last seen 09/19/2018.  He was having symptoms of ongoing dizziness and ranolazine was discontinued Compliance with diet, lifestyle and medications: Yes Last device check showed her with 7% capacity to ERI and she has accelerated to monthly checks approaching generator  change there was no arrhythmia noted on telemetry and otherwise normal parameters and function  She continues to have vertigo and dizziness and requested prescription Antivert.  She understands not to drive a car when she takes it.  Her predominant complaint is malaise and fatigue she takes a nonselective lipophilic beta-blocker will discontinue carvedilol as her EF is normal in transition her to Toprol.  No edema shortness of breath chest pain palpitation or syncope and she is approaching ERI for her CRT pacemaker defibrillator Past Medical History:  Diagnosis Date  . Anxiety   . Automatic implantable cardioverter-defibrillator in situ 10/25/2014   Overview:  Overview:  dual chamber ICD,upgraded to CRT with LBBB and nonischemic cardiomyopathy  . Biventricular ICD (implantable cardioverter-defibrillator) in place 10/25/2014   Overview:  BIV -ICD,upgraded to CRT with LBBB and nonischemic cardiomyopathy  . Chronic coronary artery disease 10/25/2014  . Costochondritis 10/25/2014  . Diabetes mellitus 2 years  . Diverticulitis   . Elevated troponin 06/04/2016   Last Assessment & Plan:  Likely may be related to ventricular pacing and underlying cardiomyopathy.  The trajectory of the biomarker elevation and neither is her clinical history suggestive of acute coronary syndrome.  Further stratification with nuclear stress testing  . GERD (gastroesophageal reflux disease)   . Glaucoma   . High cholesterol   . Hypertension   . Hypertensive heart disease with heart failure ([place]) 10/25/2014  . Hypokalemia 06/04/2016   Last Assessment & Plan:  Replace with potassium chloride.  . IBS (irritable bowel syndrome)   . Malignant  hypertension with congestive heart failure (H[facility]) 05/21/2015  . Mild CAD 10/25/2014  . Onychomycosis 11/10/2010  . Osteoarthritis   . PAD (peripheral artery disease) (H[facility]) 11/03/2014   Overview:  status post insertion of aortic stent and left common iliac stent by Dr. Imogene Burn in August of 2016  .  Peripheral arterial occlusive disease (HCC) 11/03/2014  . Precordial chest pain 06/04/2016   Last Assessment & Plan:  My suspicion is that this is likely related to gastroesophageal reflux disease with acute indigestion last evening.  The biomarker elevation is not completely unexpected in this elderly patient with reported cardiomyopathy and current demand ventricular pacing.  Nonetheless she does have a history of extensive vascular disease (though apparently no significant coronary atherosclerosis in 2013) so we will proceed with a noninvasive risk stratification with pharmacological nuclear stress testing.  Assuming this is a low risk or normal study then I would not advise any invasive cardiac evaluation at this time.  . Preoperative cardiovascular examination 04/02/2015  . Rheumatoid arthritis involving multiple sites (HCC) 05/21/2015  . Stroke (HCC)   . Vitamin D deficiency 06/20/2016    Past Surgical History:  Procedure Laterality Date  . FOOT SURGERY    . pacemaker surgery     a-fib and was done july 2014  . PERIPHERAL VASCULAR CATHETERIZATION N/A 11/05/2014   Procedure: Abdominal Aortogram;  Surgeon: Fransisco Hertz, MD;  Location: [facility] INVASIVE CV LAB;  Service: Cardiovascular;  Laterality: N/A;    Current Medications: Current Meds  Medication Sig  . Acetaminophen (TYLENOL ARTHRITIS PAIN PO) Take 2 tablets by mouth 2 (two) times daily. Reported on 07/07/2015  . ALPRAZolam (XANAX) 0.25 MG tablet Take 1 tablet (0.25 mg total) by mouth daily as needed for anxiety.  . Ascorbic Acid (VITAMIN C) 1000 MG tablet Take 1,000 mg by mouth daily.  Marland Kitchen atorvastatin (LIPITOR) 20 MG tablet Take 20 mg by mouth daily.  Marland Kitchen atorvastatin (LIPITOR) 40 MG tablet Take 40 mg by mouth daily.  . Calcium Carbonate-Vit D-Min (CALCIUM 1200 PO) Take 1 tablet by mouth daily.   . carvedilol (COREG) 25 MG tablet Take 12.5 mg by mouth 2 (two) times daily with a meal.   . clopidogrel (PLAVIX) 75 MG tablet TAKE ONE TABLET BY MOUTH  EVERY DAY  . DEXILANT 60 MG capsule Take 60 mg by mouth daily.   . furosemide (LASIX) 80 MG tablet Take 2 tablets (160 mg total) by mouth 2 (two) times daily.  Marland Kitchen glipiZIDE (GLUCOTROL) 10 MG tablet Take 10 mg by mouth 2 (two) times daily before a meal.   . isosorbide mononitrate (IMDUR) 60 MG 24 hr tablet Take 1 tablet (60 mg total) by mouth daily.  Marland Kitchen JANUVIA 100 MG tablet Take 50 mg by mouth daily.   . montelukast (SINGULAIR) 10 MG tablet Take 10 mg by mouth daily as needed for cough.  . Multiple Vitamin (MULTIVITAMIN PO) Take 1 tablet by mouth daily.   . nitroGLYCERIN (NITROSTAT) 0.4 MG SL tablet Place 1 tablet (0.4 mg total) under the tongue every 5 (five) minutes as needed.  . potassium chloride (MICRO-K) 10 MEQ CR capsule Take 10 mEq by mouth daily.  . ramipril (ALTACE) 10 MG capsule Take 10 mg by mouth daily.    . ranolazine (RANEXA) 500 MG 12 hr tablet Take 1 tablet (500 mg total) by mouth daily.  Marland Kitchen terbinafine (LAMISIL) 250 MG tablet Take 250 mg by mouth daily.  Marland Kitchen VASCEPA 1 g CAPS Take 2 capsules by mouth 2  times daily with meals. triglycerides  . Vitamin D, Ergocalciferol, (DRISDOL) 50000 units CAPS capsule Take 1 capsule by mouth every 14 (fourteen) days.   . [DISCONTINUED] Calcium Carbonate-Vitamin D 600-200 MG-UNIT TABS Take 600 mg by mouth once a week.     Allergies:   Codeine, Dulaglutide, Ivp dye [iodinated diagnostic agents], Metformin, Metrizamide, and Pioglitazone   Social History   Socioeconomic History  . Marital status: Married    Spouse name: Not on file  . Number of children: Not on file  . Years of education: Not on file  . Highest education level: Not on file  Occupational History  . Not on file  Tobacco Use  . Smoking status: Never Smoker  . Smokeless tobacco: Never Used  Substance and Sexual Activity  . Alcohol use: No    Alcohol/week: 0.0 standard drinks  . Drug use: No  . Sexual activity: Not on file  Other Topics Concern  . Not on file  Social  History Narrative  . Not on file   Social Determinants of Health   Financial Resource Strain:   . Difficulty of Paying Living Expenses: Not on file  Food Insecurity:   . Worried About Programme researcher, broadcasting/film/video in the Last Year: Not on file  . Ran Out of Food in the Last Year: Not on file  Transportation Needs:   . Lack of Transportation (Medical): Not on file  . Lack of Transportation (Non-Medical): Not on file  Physical Activity:   . Days of Exercise per Week: Not on file  . Minutes of Exercise per Session: Not on file  Stress:   . Feeling of Stress : Not on file  Social Connections:   . Frequency of Communication with Friends and Family: Not on file  . Frequency of Social Gatherings with Friends and Family: Not on file  . Attends Religious Services: Not on file  . Active Member of Clubs or Organizations: Not on file  . Attends Banker Meetings: Not on file  . Marital Status: Not on file     Family History: The patient's family history includes Alzheimer's disease in her mother; Arthritis in her mother and sister; Bladder Cancer in her father; Heart attack in her maternal grandfather and maternal grandmother; Hyperlipidemia in her father and mother; Vitiligo in her sister. ROS:   Please see the history of present illness.    All other systems reviewed and are negative.  EKGs/Labs/Other Studies Reviewed:    The following studies were reviewed today:   Recent Labs: 02/26/2019: Cholesterol 166 HDL 36 triglycerides 354 LDL 67 11/20/2018 CMP shows a potassium 4.4 creatinine 1.06 normal liver function Physical Exam:    VS:  BP 140/64 (BP Location: Right Arm, Patient Position: Sitting, Cuff Size: Normal)   Pulse 76   Ht 5\' 2"  (1.575 m)   Wt 120 lb (54.4 kg)   SpO2 95%   BMI 21.95 kg/m     Wt Readings from Last 3 Encounters:  03/20/19 120 lb (54.4 kg)  01/29/19 118 lb (53.5 kg)  11/26/18 121 lb 6.4 oz (55.1 kg)     GEN:  Well nourished, well developed in no  acute distress HEENT: Normal NECK: No JVD; No carotid bruits LYMPHATICS: No lymphadenopathy CARDIAC: RRR, no murmurs, rubs, gallops RESPIRATORY:  Clear to auscultation without rales, wheezing or rhonchi  ABDOMEN: Soft, non-tender, non-distended MUSCULOSKELETAL:  No edema; No deformity  SKIN: Warm and dry NEUROLOGIC:  Alert and oriented x 3 PSYCHIATRIC:  Normal  affect    Signed, Norman Herrlich, MD  03/20/2019 2:35 PM    [facility]

## 2019-03-20 NOTE — Patient Instructions (Addendum)
Medication Instructions:  Your physician has recommended you make the following change in your medication:   START: Toprol XL (Metoprolol) 25 mg Take 1 tablet daily.   START: Antivert (Meclizine)  12.5 mg. Take 1 tablet daily as needed for dizziness.  STOP: Carvedilol    *If you need a refill on your cardiac medications before your next appointment, please call your pharmacy*  Lab Work: None  If you have labs (blood work) drawn today and your tests are completely normal, you will receive your results only by: Marland Kitchen. MyChart Message (if you have MyChart) OR . A paper copy in the mail If you have any lab test that is abnormal or we need to change your treatment, we will call you to review the results.  Testing/Procedures: None  Follow-Up: At [facility], you and your health needs are our priority.  As part of our continuing mission to provide you with exceptional heart care, we have created designated Provider Care Teams.  These Care Teams include your primary Cardiologist (physician) and Advanced Practice Providers (APPs -  Physician Assistants and Nurse Practitioners) who all work together to provide you with the care you need, when you need it.  Your next appointment:   6 month(s)  The format for your next appointment:   In Person  Provider:   Norman HerrlichBrian Munley, MD  Meclizine tablets or capsules What is this medicine? MECLIZINE (MEK li zeen) is an antihistamine. It is used to prevent nausea, vomiting, or dizziness caused by motion sickness. It is also used to prevent and treat vertigo (extreme dizziness or a feeling that you or your surroundings are tilting or spinning around). This medicine may be used for other purposes; ask your health care provider or pharmacist if you have questions. COMMON BRAND NAME(S): Antivert, Dramamine Less Drowsy, Dramamine-N, Medivert, Meni-D What should I tell my health care provider before I take this medicine? They need to know if you have any of  these conditions:  glaucoma  lung or breathing disease, like asthma  problems urinating  prostate disease  stomach or intestine problems  an unusual or allergic reaction to meclizine, other medicines, foods, dyes, or preservatives  pregnant or trying to get pregnant  breast-feeding How should I use this medicine? Take this medicine by mouth with a glass of water. Follow the directions on the prescription label. If you are using this medicine to prevent motion sickness, take the dose at least 1 hour before travel. If it upsets your stomach, take it with food or milk. Take your doses at regular intervals. Do not take your medicine more often than directed. Talk to your pediatrician regarding the use of this medicine in children. Special care may be needed. Overdosage: If you think you have taken too much of this medicine contact a poison control center or emergency room at once. NOTE: This medicine is only for you. Do not share this medicine with others. What if I miss a dose? If you miss a dose, take it as soon as you can. If it is almost time for your next dose, take only that dose. Do not take double or extra doses. What may interact with this medicine? Do not take this medicine with any of the following medications:  MAOIs like Carbex, Eldepryl, Marplan, Nardil, and Parnate This medicine may also interact with the following medications:  alcohol  antihistamines for allergy, cough and cold  certain medicines for anxiety or sleep  certain medicines for depression, like amitriptyline, fluoxetine, sertraline  certain medicines for seizures like phenobarbital, primidone  general anesthetics like halothane, isoflurane, methoxyflurane, propofol  local anesthetics like lidocaine, pramoxine, tetracaine  medicines that relax muscles for surgery  narcotic medicines for pain  phenothiazines like chlorpromazine, mesoridazine, prochlorperazine, thioridazine This list may not  describe all possible interactions. Give your health care provider a list of all the medicines, herbs, non-prescription drugs, or dietary supplements you use. Also tell them if you smoke, drink alcohol, or use illegal drugs. Some items may interact with your medicine. What should I watch for while using this medicine? Tell your doctor or healthcare professional if your symptoms do not start to get better or if they get worse. You may get drowsy or dizzy. Do not drive, use machinery, or do anything that needs mental alertness until you know how this medicine affects you. Do not stand or sit up quickly, especially if you are an older patient. This reduces the risk of dizzy or fainting spells. Alcohol may interfere with the effect of this medicine. Avoid alcoholic drinks. Your mouth may get dry. Chewing sugarless gum or sucking hard candy, and drinking plenty of water may help. Contact your doctor if the problem does not go away or is severe. This medicine may cause dry eyes and blurred vision. If you wear contact lenses you may feel some discomfort. Lubricating drops may help. See your eye doctor if the problem does not go away or is severe. What side effects may I notice from receiving this medicine? Side effects that you should report to your doctor or health care professional as soon as possible:  feeling faint or lightheaded, falls  fast, irregular heartbeat Side effects that usually do not require medical attention (report to your doctor or health care professional if they continue or are bothersome):  constipation  headache  trouble passing urine or change in the amount of urine  trouble sleeping  upset stomach This list may not describe all possible side effects. Call your doctor for medical advice about side effects. You may report side effects to FDA at 1-800-FDA-1088. Where should I keep my medicine? Keep out of the reach of children. Store at room temperature between 15 and 30  degrees C (59 and 86 degrees F). Keep container tightly closed. Throw away any unused medicine after the expiration date. NOTE: This sheet is a summary. It may not cover all possible information. If you have questions about this medicine, talk to your doctor, pharmacist, or health care provider.  2020 Elsevier/Gold Standard (2015-04-21 19:41:02)  Metoprolol extended-release tablets What is this medicine? METOPROLOL (me TOE proe lole) is a beta-blocker. Beta-blockers reduce the workload on the heart and help it to beat more regularly. This medicine is used to treat high blood pressure and to prevent chest pain. It is also used to after a heart attack and to prevent an additional heart attack from occurring. This medicine may be used for other purposes; ask your health care provider or pharmacist if you have questions. COMMON BRAND NAME(S): toprol, Toprol XL What should I tell my health care provider before I take this medicine? They need to know if you have any of these conditions:  diabetes  heart or vessel disease like slow heart rate, worsening heart failure, heart block, sick sinus syndrome or Raynaud's disease  kidney disease  liver disease  lung or breathing disease, like asthma or emphysema  pheochromocytoma  thyroid disease  an unusual or allergic reaction to metoprolol, other beta-blockers, medicines, foods, dyes, or preservatives  pregnant or trying to get pregnant  breast-feeding How should I use this medicine? Take this medicine by mouth with a glass of water. Follow the directions on the prescription label. Do not crush or chew. Take this medicine with or immediately after meals. Take your doses at regular intervals. Do not take more medicine than directed. Do not stop taking this medicine suddenly. This could lead to serious heart-related effects. Talk to your pediatrician regarding the use of this medicine in children. While this drug may be prescribed for children as  young as 6 years for selected conditions, precautions do apply. Overdosage: If you think you have taken too much of this medicine contact a poison control center or emergency room at once. NOTE: This medicine is only for you. Do not share this medicine with others. What if I miss a dose? If you miss a dose, take it as soon as you can. If it is almost time for your next dose, take only that dose. Do not take double or extra doses. What may interact with this medicine? This medicine may interact with the following medications:  certain medicines for blood pressure, heart disease, irregular heart beat  certain medicines for depression, like monoamine oxidase (MAO) inhibitors, fluoxetine, or paroxetine  clonidine  dobutamine  epinephrine  isoproterenol  reserpine This list may not describe all possible interactions. Give your health care provider a list of all the medicines, herbs, non-prescription drugs, or dietary supplements you use. Also tell them if you smoke, drink alcohol, or use illegal drugs. Some items may interact with your medicine. What should I watch for while using this medicine? Visit your doctor or health care professional for regular check ups. Contact your doctor right away if your symptoms worsen. Check your blood pressure and pulse rate regularly. Ask your health care professional what your blood pressure and pulse rate should be, and when you should contact them. You may get drowsy or dizzy. Do not drive, use machinery, or do anything that needs mental alertness until you know how this medicine affects you. Do not sit or stand up quickly, especially if you are an older patient. This reduces the risk of dizzy or fainting spells. Contact your doctor if these symptoms continue. Alcohol may interfere with the effect of this medicine. Avoid alcoholic drinks. This medicine may increase blood sugar. Ask your healthcare provider if changes in diet or medicines are needed if you  have diabetes. What side effects may I notice from receiving this medicine? Side effects that you should report to your doctor or health care professional as soon as possible:  allergic reactions like skin rash, itching or hives  cold or numb hands or feet  depression  difficulty breathing  faint  fever with sore throat  irregular heartbeat, chest pain  rapid weight gain   signs and symptoms of high blood sugar such as being more thirsty or hungry or having to urinate more than normal. You may also feel very tired or have blurry vision.  swollen legs or ankles Side effects that usually do not require medical attention (report to your doctor or health care professional if they continue or are bothersome):  anxiety or nervousness  change in sex drive or performance  dry skin  headache  nightmares or trouble sleeping  short term memory loss  stomach upset or diarrhea This list may not describe all possible side effects. Call your doctor for medical advice about side effects. You may report side effects to FDA  at 1-800-FDA-1088. Where should I keep my medicine? Keep out of the reach of children. Store at room temperature between 15 and 30 degrees C (59 and 86 degrees F). Throw away any unused medicine after the expiration date. NOTE: This sheet is a summary. It may not cover all possible information. If you have questions about this medicine, talk to your doctor, pharmacist, or health care provider.  2020 Elsevier/Gold Standard (2018-01-08 11:09:41)

## 2019-04-14 ENCOUNTER — Ambulatory Visit (INDEPENDENT_AMBULATORY_CARE_PROVIDER_SITE_OTHER): Payer: Medicare PPO | Admitting: *Deleted

## 2019-04-14 DIAGNOSIS — I5042 Chronic combined systolic (congestive) and diastolic (congestive) heart failure: Secondary | ICD-10-CM

## 2019-04-14 LAB — CUP PACEART REMOTE DEVICE CHECK
Battery Remaining Longevity: 1 mo
Battery Remaining Percentage: 3 %
Battery Voltage: 2.6 V
Brady Statistic AP VP Percent: 1 %
Brady Statistic AP VS Percent: 1 %
Brady Statistic AS VP Percent: 99 %
Brady Statistic AS VS Percent: 1 %
Brady Statistic RA Percent Paced: 1 %
Date Time Interrogation Session: 20210110020029
HighPow Impedance: 66 Ohm
HighPow Impedance: 66 Ohm
Implantable Lead Implant Date: 20140702
Implantable Lead Implant Date: 20140702
Implantable Lead Implant Date: 20150730
Implantable Lead Location: 753858
Implantable Lead Location: 753859
Implantable Lead Location: 753860
Implantable Lead Model: 7122
Implantable Pulse Generator Implant Date: 20150730
Lead Channel Impedance Value: 330 Ohm
Lead Channel Impedance Value: 480 Ohm
Lead Channel Impedance Value: 490 Ohm
Lead Channel Pacing Threshold Amplitude: 1 V
Lead Channel Pacing Threshold Amplitude: 1 V
Lead Channel Pacing Threshold Amplitude: 2.75 V
Lead Channel Pacing Threshold Pulse Width: 0.5 ms
Lead Channel Pacing Threshold Pulse Width: 0.5 ms
Lead Channel Pacing Threshold Pulse Width: 1 ms
Lead Channel Sensing Intrinsic Amplitude: 1.7 mV
Lead Channel Sensing Intrinsic Amplitude: 11.7 mV
Lead Channel Setting Pacing Amplitude: 2 V
Lead Channel Setting Pacing Amplitude: 2 V
Lead Channel Setting Pacing Amplitude: 3.25 V
Lead Channel Setting Pacing Pulse Width: 0.5 ms
Lead Channel Setting Pacing Pulse Width: 1 ms
Lead Channel Setting Sensing Sensitivity: 0.5 mV
Pulse Gen Serial Number: 7147055

## 2019-04-19 NOTE — Progress Notes (Signed)
Battery check 

## 2019-05-02 ENCOUNTER — Encounter: Payer: Self-pay | Admitting: Sports Medicine

## 2019-05-02 ENCOUNTER — Ambulatory Visit: Payer: Medicare Other | Admitting: Sports Medicine

## 2019-05-02 ENCOUNTER — Other Ambulatory Visit: Payer: Self-pay

## 2019-05-02 DIAGNOSIS — B351 Tinea unguium: Secondary | ICD-10-CM | POA: Diagnosis not present

## 2019-05-02 DIAGNOSIS — M79675 Pain in left toe(s): Secondary | ICD-10-CM

## 2019-05-02 DIAGNOSIS — I739 Peripheral vascular disease, unspecified: Secondary | ICD-10-CM

## 2019-05-02 DIAGNOSIS — E1140 Type 2 diabetes mellitus with diabetic neuropathy, unspecified: Secondary | ICD-10-CM

## 2019-05-02 DIAGNOSIS — M79674 Pain in right toe(s): Secondary | ICD-10-CM

## 2019-05-02 DIAGNOSIS — Q828 Other specified congenital malformations of skin: Secondary | ICD-10-CM

## 2019-05-02 NOTE — Progress Notes (Signed)
Subjective: Shari [name] is a 85 y.o. female patient with history of diabetes who returns to office today complaining of callus skin and long, painful nails  while ambulating in shoes; unable to trim. Patient states that the glucose reading this morning was 161 and last A1c was 7.5. Patient denies any new changes in medication or new problems but reports that she will need to come less due to increase in Co-pay and medications.   Saw PCP, Dr. Sol Passer in Nov.   Patient Active Problem List   Diagnosis Date Noted  . Vitamin D deficiency 06/20/2016  . Elevated troponin 06/04/2016  . Hypokalemia 06/04/2016  . Precordial chest pain 06/04/2016  . Rheumatoid arthritis involving multiple sites (HCC) 05/21/2015  . Preoperative cardiovascular examination 04/02/2015  . PAD (peripheral artery disease) (HCC) 11/03/2014  . Peripheral arterial occlusive disease (HCC) 11/03/2014  . Automatic implantable cardioverter-defibrillator in situ 10/25/2014  . 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 Chronic combined systolic and diastolic heart failure (HCC) 10/25/2014  . Costochondritis 10/25/2014  . Mild CAD 10/25/2014  . Hyperlipidemia 11/10/2010  . Hypertensive heart and kidney disease with heart failure and with chronic kidney disease stage III (HCC) 11/10/2010  . ibs 11/10/2010  . Osteoarthritis 11/10/2010  . Diverticulitis 11/10/2010  . GERD (gastroesophageal reflux disease) 11/10/2010  . Glaucoma 11/10/2010  . Diabetes mellitus 11/10/2010  . Onychomycosis 11/10/2010   Current Outpatient Medications on File Prior to Visit  Medication Sig Dispense Refill  . Acetaminophen (TYLENOL ARTHRITIS PAIN PO) Take 2 tablets by mouth 2 (two) times daily. Reported on 07/07/2015    . ALPRAZolam (XANAX) 0.25 MG tablet Take 1 tablet (0.25 mg total) by mouth daily as needed for anxiety. 30 tablet 0  . Ascorbic Acid (VITAMIN C) 1000 MG tablet Take 1,000 mg by mouth daily.    Marland Kitchen  atorvastatin (LIPITOR) 20 MG tablet Take 20 mg by mouth daily.    Marland Kitchen atorvastatin (LIPITOR) 40 MG tablet Take 40 mg by mouth daily.    . Calcium Carbonate-Vit D-Min (CALCIUM 1200 PO) Take 1 tablet by mouth daily.     . carvedilol (COREG) 25 MG tablet Take 12.5 mg by mouth 2 (two) times daily with a meal.     . clopidogrel (PLAVIX) 75 MG tablet TAKE ONE TABLET BY MOUTH EVERY DAY 90 tablet 2  . DEXILANT 60 MG capsule Take 60 mg by mouth daily.     . furosemide (LASIX) 80 MG tablet Take 2 tablets (160 mg total) by mouth 2 (two) times daily. 360 tablet 1  . glipiZIDE (GLUCOTROL) 10 MG tablet Take 10 mg by mouth 2 (two) times daily before a meal.     . isosorbide mononitrate (IMDUR) 60 MG 24 hr tablet Take 1 tablet (60 mg total) by mouth daily. 90 tablet 1  . JANUVIA 100 MG tablet Take 50 mg by mouth daily.   3  . meclizine (ANTIVERT) 12.5 MG tablet Take 1 tablet daily as needed for dizziness. 30 tablet 1  . metoprolol succinate (TOPROL XL) 25 MG 24 hr tablet Take 1 tablet (25 mg total) by mouth daily. 30 tablet 5  . montelukast (SINGULAIR) 10 MG tablet Take 10 mg by mouth daily as needed for cough.    . Multiple Vitamin (MULTIVITAMIN PO) Take 1 tablet by mouth daily.     . nitroGLYCERIN (NITROSTAT) 0.4 MG SL tablet Place 1 tablet (0.4 mg total) under the tongue every 5 (five) minutes as needed. 25 tablet 6  .  potassium chloride (MICRO-K) 10 MEQ CR capsule Take 10 mEq by mouth daily.    . ramipril (ALTACE) 10 MG capsule Take 10 mg by mouth daily.      . ranolazine (RANEXA) 500 MG 12 hr tablet Take 1 tablet (500 mg total) by mouth daily. 90 tablet 1  . terbinafine (LAMISIL) 250 MG tablet Take 250 mg by mouth daily.    Marland Kitchen VASCEPA 1 g CAPS Take 2 capsules by mouth 2 times daily with meals. triglycerides  3  . Vitamin D, Ergocalciferol, (DRISDOL) 50000 units CAPS capsule Take 1 capsule by mouth every 14 (fourteen) days.      No current facility-administered medications on file prior to visit.    Allergies  Allergen Reactions  . Codeine Diarrhea, Nausea And Vomiting, Other (See Comments) and Hives    All-over body aches, too Other reaction(s): GI intolerance  . Dulaglutide Other (See Comments)    Chest pain, N/V  . Ivp Dye [Iodinated Diagnostic Agents] Nausea And Vomiting and Other (See Comments)  . Metformin Other (See Comments)  . Metrizamide Nausea And Vomiting and Other (See Comments)  . Pioglitazone Other (See Comments)    Has CHF    Recent Results (from the past 2160 hour(s))  CUP PACEART REMOTE DEVICE CHECK     Status: None   Collection Time: 02/10/19  7:00 AM  Result Value Ref Range   Date Time Interrogation Session [id number]    Pulse Generator Manufacturer [facility]    Pulse Gen Model 2947-65Y Dillon Bjork    Pulse Gen Serial Number [id number]    Clinic Name [facility]    Implantable Pulse Generator Type Cardiac Resynch Therapy Defibulator    Implantable Pulse Generator Implant Date 56812751    Implantable Lead Manufacturer [facility]    Implantable Lead Model 1458Q Quartet    Implantable Lead Serial Number [id number]    Implantable Lead Implant Date 70017494    Implantable Lead Location Detail 1 UNKNOWN    Implantable Lead Location [id number]    Implantable Lead Manufacturer [facility]    Implantable Lead Model [phone] Tendril STS    Implantable Lead Serial Number [id number]    Implantable Lead Implant Date 16384665    Implantable Lead Location Detail 1 UNKNOWN    Implantable Lead Location [id number]    Implantable Lead Manufacturer [facility]    Implantable Lead Model [id number] Durata    Implantable Lead Serial Number [id number]    Implantable Lead Implant Date 99357017    Implantable Lead Location Detail 1 UNKNOWN    Implantable Lead Location [id number]    Lead Channel Setting Sensing Sensitivity 0.5 mV   Lead Channel Setting Sensing Adaptation Mode Adaptive Sensing    Lead Channel Setting Pacing Amplitude 2.0 V   Lead Channel Setting Pacing Pulse Width 0.5 ms   Lead Channel Setting  Pacing Amplitude 2.0 V   Lead Channel Setting Pacing Pulse Width 1.0 ms   Lead Channel Setting Pacing Amplitude 3.25 V   Lead Channel Setting Pacing Capture Mode Fixed Pacing    Lead Channel Status NULL    Lead Channel Impedance Value 490 ohm   Lead Channel Pacing Threshold Amplitude 2.75 V   Lead Channel Pacing Threshold Pulse Width 1.0 ms   Lead Channel Status NULL    Lead Channel Impedance Value 440 ohm   Lead Channel Sensing Intrinsic Amplitude 2.1 mV   Lead Channel Pacing Threshold Amplitude 1.0 V   Lead Channel Pacing Threshold Pulse Width 0.5 ms   Lead Channel Status  NULL    Lead Channel Impedance Value 330 ohm   Lead Channel Sensing Intrinsic Amplitude 11.7 mV   Lead Channel Pacing Threshold Amplitude 1.0 V   Lead Channel Pacing Threshold Pulse Width 0.5 ms   HighPow Impedance 64 ohm   HighPow Impedance 64 ohm   HighPow Imped Status NULL    HighPow Imped Status NULL    Battery Status MOS    Battery Remaining Longevity 4 mo   Battery Remaining Percentage 7.0 %   Battery Voltage 2.65 V   Brady Statistic RA Percent Paced 1.0 %   Brady Statistic AP VP Percent 1.0 %   Brady Statistic AS VP Percent 99.0 %   Brady Statistic AP VS Percent 1.0 %   Brady Statistic AS VS Percent 1.0 %  CUP PACEART REMOTE DEVICE CHECK     Status: None   Collection Time: 03/13/19  3:32 AM  Result Value Ref Range   Date Time Interrogation Session [id number]    Pulse Generator Manufacturer [facility]    Pulse Gen Model 3365-40Q Quadra Assura    Pulse Gen Serial Number [id number]    Clinic Name [facility]    Implantable Pulse Generator Type Cardiac Resynch Therapy Defibulator    Implantable Pulse Generator Implant Date 73220254    Implantable Lead Manufacturer [facility]    Implantable Lead Model 1458Q Quartet    Implantable Lead Serial Number [id number]    Implantable Lead Implant Date 27062376    Implantable Lead Location Detail 1 UNKNOWN    Implantable Lead Location [id number]    Implantable Lead  Manufacturer [facility]    Implantable Lead Model [id number] Tendril STS    Implantable Lead Serial Number [id number]    Implantable Lead Implant Date 76160737    Implantable Lead Location Detail 1 UNKNOWN    Implantable Lead Location [id number]    Implantable Lead Manufacturer [facility]    Implantable Lead Model 7122 Durata    Implantable Lead Serial Number [id number]    Implantable Lead Implant Date 10626948    Implantable Lead Location Detail 1 UNKNOWN    Implantable Lead Location [phone]    Lead Channel Setting Sensing Sensitivity 0.5 mV   Lead Channel Setting Sensing Adaptation Mode Adaptive Sensing    Lead Channel Setting Pacing Amplitude 2.0 V   Lead Channel Setting Pacing Pulse Width 0.5 ms   Lead Channel Setting Pacing Amplitude 2.0 V   Lead Channel Setting Pacing Pulse Width 1.0 ms   Lead Channel Setting Pacing Amplitude 3.25 V   Lead Channel Setting Pacing Capture Mode Fixed Pacing    Lead Channel Status NULL    Lead Channel Impedance Value 490 ohm   Lead Channel Pacing Threshold Amplitude 2.75 V   Lead Channel Pacing Threshold Pulse Width 1.0 ms   Lead Channel Status NULL    Lead Channel Impedance Value 460 ohm   Lead Channel Sensing Intrinsic Amplitude 3.1 mV   Lead Channel Pacing Threshold Amplitude 1.0 V   Lead Channel Pacing Threshold Pulse Width 0.5 ms   Lead Channel Status NULL    Lead Channel Impedance Value 340 ohm   Lead Channel Sensing Intrinsic Amplitude 11.7 mV   Lead Channel Pacing Threshold Amplitude 1.0 V   Lead Channel Pacing Threshold Pulse Width 0.5 ms   HighPow Impedance 65 ohm   HighPow Impedance 65 ohm   HighPow Imped Status NULL    HighPow Imped Status NULL    Battery Status MOS    Battery Remaining Longevity 3 mo  Battery Remaining Percentage 6.0 %   Battery Voltage 2.63 V   Brady Statistic RA Percent Paced 1.0 %   Brady Statistic AP VP Percent 1.0 %   Brady Statistic AS VP Percent 99.0 %   Brady Statistic AP VS Percent 1.0 %   Brady Statistic AS VS Percent  1.0 %  CUP PACEART REMOTE DEVICE CHECK     Status: None   Collection Time: 04/13/19  2:00 AM  Result Value Ref Range   Date Time Interrogation Session [id number]    Pulse Generator Manufacturer [facility]    Pulse Gen Model 3365-40Q Quadra Assura    Pulse Gen Serial Number [id number]    Clinic Name [facility]    Implantable Pulse Generator Type Cardiac Resynch Therapy Defibulator    Implantable Pulse Generator Implant Date 81829937    Implantable Lead Manufacturer [facility]    Implantable Lead Model 1458Q Quartet    Implantable Lead Serial Number [id number]    Implantable Lead Implant Date 16967893    Implantable Lead Location Detail 1 UNKNOWN    Implantable Lead Location [id number]    Implantable Lead Manufacturer [facility]    Implantable Lead Model [phone] Tendril STS    Implantable Lead Serial Number [id number]    Implantable Lead Implant Date 10258527    Implantable Lead Location Detail 1 UNKNOWN    Implantable Lead Location [id number]    Implantable Lead Manufacturer [facility]    Implantable Lead Model C1931474 Durata    Implantable Lead Serial Number [id number]    Implantable Lead Implant Date 78242353    Implantable Lead Location Detail 1 UNKNOWN    Implantable Lead Location [id number]    Lead Channel Setting Sensing Sensitivity 0.5 mV   Lead Channel Setting Sensing Adaptation Mode Adaptive Sensing    Lead Channel Setting Pacing Amplitude 2.0 V   Lead Channel Setting Pacing Pulse Width 0.5 ms   Lead Channel Setting Pacing Amplitude 2.0 V   Lead Channel Setting Pacing Pulse Width 1.0 ms   Lead Channel Setting Pacing Amplitude 3.25 V   Lead Channel Setting Pacing Capture Mode Fixed Pacing    Lead Channel Status NULL    Lead Channel Impedance Value 490 ohm   Lead Channel Pacing Threshold Amplitude 2.75 V   Lead Channel Pacing Threshold Pulse Width 1.0 ms   Lead Channel Status NULL    Lead Channel Impedance Value 480 ohm   Lead Channel Sensing Intrinsic Amplitude 1.7 mV   Lead Channel Pacing Threshold  Amplitude 1.0 V   Lead Channel Pacing Threshold Pulse Width 0.5 ms   Lead Channel Status NULL    Lead Channel Impedance Value 330 ohm   Lead Channel Sensing Intrinsic Amplitude 11.7 mV   Lead Channel Pacing Threshold Amplitude 1.0 V   Lead Channel Pacing Threshold Pulse Width 0.5 ms   HighPow Impedance 66 ohm   HighPow Impedance 66 ohm   HighPow Imped Status NULL    HighPow Imped Status NULL    Battery Status MOS    Battery Remaining Longevity 1 mo   Battery Remaining Percentage 3.0 %   Battery Voltage 2.6 V   Brady Statistic RA Percent Paced 1.0 %   Brady Statistic AP VP Percent 1.0 %   Brady Statistic AS VP Percent 99.0 %   Brady Statistic AP VS Percent 1.0 %   Brady Statistic AS VS Percent 1.0 %    Objective: General: Patient is awake, alert, and oriented x 3 and in no acute distress.  Integument: Skin  is warm, dry and supple bilateral. Nails are tender, long, thickened and dystrophic with subungual debris, consistent with onychomycosis, 2-5 bilateral. There are no nails on bilateral hallux, or very limited nail due to previous nail procedure bilateral There is mild callus at the right hallux and medial first metatarsophalangeal joint with surrounding dry skin with no signs of infection. No open lesions present bilateral. Remaining integument unremarkable.  Vasculature: Dorsalis Pedis pulse 1/4 bilateral. Posterior Tibial pulse 0/4 bilateral due to trace edema at ankles. Capillary fill time <5 sec 1-5 bilateral. Scant hair growth to the level of the digits.Temperature gradient within normal limits. Mild varicosities present bilateral.  Neurology: The patient has slightly diminished sensation measured with a 5.07/10g Semmes Weinstein Monofilament at all pedal sites bilateral . Vibratory sensation diminished bilateral with tuning fork. No Babinski sign present bilateral.   Musculoskeletal: Asymptomatic mild hammertoes pedal deformities noted bilateral. Muscular strength 5/5 in all  lower extremity muscular groups bilateral without pain on range of motion . No tenderness with calf compression bilateral.  Assessment and Plan: Problem List Items Addressed This Visit      Cardiovascular and Mediastinum   PAD (peripheral artery disease) ([place])    Other Visit Diagnoses    Pain due to onychomycosis of toenails of both feet    -  Primary   Porokeratosis       Type 2 diabetes, controlled, with neuropathy ([place])         -Examined patient. -Re-Discussed and educated patient on diabetic foot care, especially with  regards to the vascular, neurological and musculoskeletal systems.  -Mechanically debrided all nails bilateral using sterile nail nipper and filed with dremel without incident & smoothed callus2 on right foot using rotary bur without incident.   -Continue with diabetic shoes. -Patient to return  in 3-6 months for at risk foot care -Patient advised to call the office if any problems or questions arise in the meantime.  Landis Martins, DPM

## 2019-05-05 ENCOUNTER — Telehealth: Payer: Self-pay | Admitting: Emergency Medicine

## 2019-05-05 NOTE — Telephone Encounter (Signed)
Patient called to alert office that her ICD had vibrated. She reached ERI on 05/04/19 and alert received C/O decreased energy levels and nasal congestion X 2 weeks, afebrile and COVID-19 vaccine received last week. Remote transmission received and no alerts or arrhythmias on transmission today. Advised to call PCP concerning sinus issues. Will have scheduler place patient on Dr Elberta Fortis schedule to discuss gen change.

## 2019-05-06 ENCOUNTER — Other Ambulatory Visit: Payer: Self-pay | Admitting: Cardiology

## 2019-05-06 DIAGNOSIS — I739 Peripheral vascular disease, unspecified: Secondary | ICD-10-CM

## 2019-05-06 NOTE — Telephone Encounter (Signed)
Scheduled to see Dr. Elberta Fortis 2/9 to discuss further

## 2019-05-06 NOTE — Telephone Encounter (Signed)
Rx refill sent to pharmacy. 

## 2019-05-13 ENCOUNTER — Encounter: Payer: Self-pay | Admitting: Cardiology

## 2019-05-13 ENCOUNTER — Ambulatory Visit: Payer: Medicare PPO | Admitting: Cardiology

## 2019-05-13 ENCOUNTER — Other Ambulatory Visit: Payer: Self-pay

## 2019-05-13 ENCOUNTER — Encounter (INDEPENDENT_AMBULATORY_CARE_PROVIDER_SITE_OTHER): Payer: Self-pay

## 2019-05-13 VITALS — BP 138/72 | HR 70 | Ht 62.0 in | Wt 123.0 lb

## 2019-05-13 DIAGNOSIS — I5022 Chronic systolic (congestive) heart failure: Secondary | ICD-10-CM

## 2019-05-13 NOTE — Progress Notes (Signed)
Electrophysiology Office Note   Date:  05/13/2019   ID:  Shari, [name] January 03, 1935, MRN [id number]  PCP:  Olive Bass, MD  Cardiologist:  Dulce Sellar Primary Electrophysiologist:   [name], MD    No chief complaint on file.    History of Present Illness: Shari [name] is a 85 y.o. female who is being seen today for the evaluation of CHF at the request of Shari, Doris Cheadle, MD. Presenting today for electrophysiology evaluation. She has a history of hypertensive heart disease, mild coronary artery disease, chronic combined systolic and diastolic heart failure, hyperlipidemia.  She has a BiV ICD in place.  Today, denies symptoms of palpitations, chest pain, shortness of breath, orthopnea, PND, lower extremity edema, claudication, dizziness, presyncope, syncope, bleeding, or neurologic sequela. The patient is tolerating medications without difficulties.  She has some weakness and fatigue, but this is stable.  Otherwise she is doing well without complaint.   Past Medical History:  Diagnosis Date  . Anxiety   . Automatic implantable cardioverter-defibrillator in situ 10/25/2014   Overview:  Overview:  dual chamber ICD,upgraded to CRT with LBBB and nonischemic cardiomyopathy  . Biventricular ICD (implantable cardioverter-defibrillator) in place 10/25/2014   Overview:  BIV -ICD,upgraded to CRT with LBBB and nonischemic cardiomyopathy  . Chronic coronary artery disease 10/25/2014  . Costochondritis 10/25/2014  . Diabetes mellitus 2 years  . Diverticulitis   . Elevated troponin 06/04/2016   Last Assessment & Plan:  Likely may be related to ventricular pacing and underlying cardiomyopathy.  The trajectory of the biomarker elevation and neither is her clinical history suggestive of acute coronary syndrome.  Further stratification with nuclear stress testing  . GERD (gastroesophageal reflux disease)   . Glaucoma   . High cholesterol   . Hypertension   . Hypertensive heart disease with  heart failure (HCC) 10/25/2014  . Hypokalemia 06/04/2016   Last Assessment & Plan:  Replace with potassium chloride.  . IBS (irritable bowel syndrome)   . 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 Mild CAD 10/25/2014  . Onychomycosis 11/10/2010  . Osteoarthritis   . PAD (peripheral artery disease) (HCC) 11/03/2014   Overview:  status post insertion of aortic stent and left common iliac stent by Dr. Imogene Burn in August of 2016  . Peripheral arterial occlusive disease (HCC) 11/03/2014  . Precordial chest pain 06/04/2016   Last Assessment & Plan:  My suspicion is that this is likely related to gastroesophageal reflux disease with acute indigestion last evening.  The biomarker elevation is not completely unexpected in this elderly patient with reported cardiomyopathy and current demand ventricular pacing.  Nonetheless she does have a history of extensive vascular disease (though apparently no significant coronary atherosclerosis in 2013) so we  proceed with a noninvasive risk stratification with pharmacological nuclear stress testing.  Assuming this is a low risk or normal study then I would not advise any invasive cardiac evaluation at this time.  . Preoperative cardiovascular examination 04/02/2015  . Rheumatoid arthritis involving multiple sites (HCC) 05/21/2015  . Stroke (HCC)   . Vitamin D deficiency 06/20/2016   Past Surgical History:  Procedure Laterality Date  . FOOT SURGERY    . pacemaker surgery     a-fib and was done july 2014  . PERIPHERAL VASCULAR CATHETERIZATION N/A 11/05/2014   Procedure: Abdominal Aortogram;  Surgeon: Fransisco Hertz, MD;  Location: [facility] INVASIVE CV LAB;  Service: Cardiovascular;  Laterality: N/A;     Current Outpatient Medications  Medication Sig Dispense Refill  . Acetaminophen (TYLENOL ARTHRITIS PAIN PO) Take 2 tablets by mouth 2 (two) times daily. Reported on 07/07/2015    . ALPRAZolam (XANAX) 0.25 MG tablet Take 1 tablet (0.25 mg total) by mouth  daily as needed for anxiety. 30 tablet 0  . Ascorbic Acid (VITAMIN C) 1000 MG tablet Take 1,000 mg by mouth daily.    Marland Kitchen atorvastatin (LIPITOR) 40 MG tablet Take 40 mg by mouth daily.    . Calcium Carbonate-Vit D-Min (CALCIUM 1200 PO) Take 1 tablet by mouth daily.     . clopidogrel (PLAVIX) 75 MG tablet TAKE ONE TABLET BY MOUTH EVERY DAY 90 tablet 2  . DEXILANT 60 MG capsule Take 60 mg by mouth daily.     . furosemide (LASIX) 80 MG tablet Take 2 tablets (160 mg total) by mouth 2 (two) times daily. 360 tablet 1  . glipiZIDE (GLUCOTROL) 10 MG tablet Take 10 mg by mouth 2 (two) times daily before a meal.     . isosorbide mononitrate (IMDUR) 60 MG 24 hr tablet Take 1 tablet (60 mg total) by mouth daily. 90 tablet 1  . JANUVIA 25 MG tablet Take 25 mg by mouth daily.    . meclizine (ANTIVERT) 12.5 MG tablet Take 1 tablet daily as needed for dizziness. 30 tablet 1  . metoprolol succinate (TOPROL XL) 25 MG 24 hr tablet Take 1 tablet (25 mg total) by mouth daily. 30 tablet 5  . montelukast (SINGULAIR) 10 MG tablet Take 10 mg by mouth daily as needed for cough.    . Multiple Vitamin (MULTIVITAMIN PO) Take 1 tablet by mouth daily.     . nitroGLYCERIN (NITROSTAT) 0.4 MG SL tablet Place 1 tablet (0.4 mg total) under the tongue every 5 (five) minutes as needed. 25 tablet 6  . potassium chloride (MICRO-K) 10 MEQ CR capsule Take 10 mEq by mouth daily.    . ramipril (ALTACE) 10 MG capsule Take 10 mg by mouth daily.      . ranolazine (RANEXA) 500 MG 12 hr tablet Take 1 tablet (500 mg total) by mouth daily. 90 tablet 1  . terbinafine (LAMISIL) 250 MG tablet Take 250 mg by mouth daily.    Marland Kitchen VASCEPA 1 g CAPS Take 2 capsules by mouth 2 times daily with meals. triglycerides  3  . Vitamin D, Ergocalciferol, (DRISDOL) 50000 units CAPS capsule Take 1 capsule by mouth every 14 (fourteen) days.      No current facility-administered medications for this visit.    Allergies:   Codeine, Dulaglutide, Ivp dye [iodinated  diagnostic agents], Metformin, Metrizamide, and Pioglitazone   Social History:  The patient  reports that she has never smoked. She has never used smokeless tobacco. She reports that she does not drink alcohol or use drugs.   Family History:  The patient's family history includes Alzheimer's disease in her mother; Arthritis in her mother and sister; Bladder Cancer in her father; Heart attack in her maternal grandfather and maternal grandmother; Hyperlipidemia in her father and mother; Vitiligo in her sister.    ROS:  Please see the history of present illness.   Otherwise, review of systems is positive for none.   All other systems are reviewed and negative.   PHYSICAL EXAM: VS:  BP 138/72   Pulse 70   Ht 5\' 2"  (1.575 m)   Wt 123 lb (55.8 kg)   SpO2 97%   BMI 22.50 kg/m  , BMI Body mass index is 22.5 kg/m.  GEN: Well nourished, well developed, in no acute distress  HEENT: normal  Neck: no JVD, carotid bruits, or masses Cardiac: RRR; no murmurs, rubs, or gallops,no edema  Respiratory:  clear to auscultation bilaterally, normal work of breathing GI: soft, nontender, nondistended, + BS MS: no deformity or atrophy  Skin: warm and dry, device site well healed Neuro:  Strength and sensation are intact Psych: euthymic mood, full affect  EKG:  EKG is ordered today. Personal review of the ekg ordered shows SR, V paced  Personal review of the device interrogation today. Results in Paceart   Recent Labs: No results found for requested labs within last 8760 hours.    Lipid Panel  No results found for: CHOL, TRIG, HDL, CHOLHDL, VLDL, LDLCALC, LDLDIRECT   Wt Readings from Last 3 Encounters:  05/13/19 123 lb (55.8 kg)  03/20/19 120 lb (54.4 kg)  01/29/19 118 lb (53.5 kg)      Other studies Reviewed: Additional studies/ records that were reviewed today include: TTE 05/22/2017 Review of the above records today demonstrates:  - Left ventricle: The cavity size was normal. Systolic  function was   normal. The estimated ejection fraction was in the range of 60%   to 65%. Wall motion was normal; there were no regional wall   motion abnormalities. - Aortic valve: There was mild regurgitation. Valve area (VTI):   1.83 cm^2. Valve area (Vmax): 1.72 cm^2. Valve area (Vmean): 1.71   cm^2.   ASSESSMENT AND PLAN:  1.  Chronic systolic and diastolic heart failure: Patient is status post Saint Jude CRT-D.  Currently on optimal medical therapy.  Device functioning appropriately.  Her battery has reached ERI.  We  plan for generator change.  Risks and benefits were discussed which include bleeding and infection.  She understands these risks and is agreed to the procedure.    2. Hypertension: Mildly elevated today but usually well controlled.  We  make no changes at this time.  3. Hyperlipidemia: Continue atorvastatin.  4. Coronary artery disease: Currently on Plavix.  No current chest pain.  Continue with current management.  Case discussed with primary cardiology  Current medicines are reviewed at length with the patient today.   The patient does not have concerns regarding her medicines.  The following changes were made today: none  Labs/ tests ordered today include:  Orders Placed This Encounter  Procedures  . EKG 12-Lead     Disposition:   FU with   3 months  Signed,  [name], MD  05/13/2019 2:12 PM     [facility] [address] [place] [address] [phone] (office) [phone] (fax)

## 2019-05-13 NOTE — Patient Instructions (Signed)
Medication Instructions:  Your physician recommends that you continue on your current medications as directed. Please refer to the Current Medication list given to you today.     * If you need a refill on your cardiac medications before your next appointment, please call your pharmacy. *   Labwork: None ordered If you have labs (blood work) drawn today and your tests are completely normal, you will receive your results only by:  MyChart Message (if you have MyChart) OR  A paper copy in the mail If you have any lab test that is abnormal or we need to change your treatment, we will call you to review the results.   Testing/Procedures: Your physician has recommended that you have a defibrillator battery change. Please follow the instructions below, located under the special instructions section.   Follow-Up: Your physician recommends that you schedule a wound check appointment 10-14 days, after your procedure on 06/23/2019, with the device clinic.  Your physician recommends that you schedule a follow up appointment in 91 days, after your procedure on 06/23/2019, with Dr. Curt Bears.  Thank you for choosing [facility]!!   Trinidad Curet, RN [phone]   Any Other Special Instructions Will Be Listed Below (If Applicable).    Implantable Device Instructions  You are scheduled for: Defibrillator battery change on 06/23/2019 with Dr. Curt Bears.  1.   Pre procedure testing-         B. COVID TEST-- On 06/20/2019 @ 2:30 pm - You will go to [facility] hospital ([place]) for your Covid testing.   This is a drive thru test site.  There will be multiple testing areas.  Be sure to share with the first checkpoint that you are there for pre-procedure/surgery testing. This will put you into the right (yellow) lane that leads to the PAT testing team.   Stay in your car and the nurse team will come to your car to test you.  After you are tested please go home and self quarantine  until the day of your procedure.    2. On the day of your procedure 06/23/2019 you will go to [facility] [phone]) at 7:30 am.  Dennis Bast will go to the main entrance [address] [employer]) and enter where the [employer] are.  You will check in at ADMITTING.  You may have one support person come in to the hospital with you.  They will be asked to wait in the waiting room.   3.   Do not eat or drink after midnight prior to your procedure.   4.   On the morning of your procedure do NOT take any medication.  5.  The night before your procedure and the morning of your procedure scrub your neck/chest with surgical scrub.  An instruction letter is included below   5.  Plan for an overnight stay.  If you use your phone frequently bring your phone charger.  When you are discharged you will need someone to drive you home.   6.  You will follow up with the Device clinic 10-14 days after your procedure. You will follow up with Dr. Curt Bears 91 days after your procedure.  These appointments will be made for you.   * If you have ANY questions after you get home, please call the office [phone] and ask for Hezekiah Veltre RN or send a MyChart message.     Supplemental Discharge Instructions for  Pacemaker/Defibrillator Patients  ACTIVITY No  heavy lifting or vigorous activity with your left/right arm for 6 to 8 weeks.  Do not raise your left/right arm above your head for one week.  Gradually raise your affected arm as drawn below.           __  NO DRIVING for     ; you may begin driving on     .  WOUND CARE - Keep the wound area clean and dry.  Do not get this area wet for one week. No showers for one week; you may shower on     . - The tape/steri-strips on your wound will fall off; do not pull them off.  No bandage is needed on the site.  DO  NOT apply any creams, oils, or ointments to the wound area. - If you notice any drainage or discharge from the wound, any swelling or bruising at  the site, or you develop a fever > 101? F after you are discharged home, call the office at once.  SPECIAL INSTRUCTIONS - You are still able to use cellular telephones; use the ear opposite the side where you have your pacemaker/defibrillator.  Avoid carrying your cellular phone near your device. - When traveling through airports, show security personnel your identification card to avoid being screened in the metal detectors.  Ask the security personnel to use the hand wand. - Avoid arc welding equipment, MRI testing (magnetic resonance imaging), TENS units (transcutaneous nerve stimulators).  Call the office for questions about other devices. - Avoid electrical appliances that are in poor condition or are not properly grounded. - Microwave ovens are safe to be near or to operate.  ADDITIONAL INFORMATION FOR DEFIBRILLATOR PATIENTS SHOULD YOUR DEVICE GO OFF: - If your device goes off ONCE and you feel fine afterward, notify the device clinic nurses. - If your device goes off ONCE and you do not feel well afterward, call 911. - If your device goes off TWICE, call 911. - If your device goes off THREE TIMES IN ONE DAY, call 911.  DO NOT DRIVE YOURSELF OR A FAMILY MEMBER WITH A DEFIBRILLATOR TO THE HOSPITAL-CALL 911.

## 2019-05-26 LAB — CUP PACEART INCLINIC DEVICE CHECK
Date Time Interrogation Session: 20210209103527
Implantable Lead Implant Date: 20140702
Implantable Lead Implant Date: 20140702
Implantable Lead Implant Date: 20150730
Implantable Lead Location: 753858
Implantable Lead Location: 753859
Implantable Lead Location: 753860
Implantable Lead Model: 7122
Implantable Pulse Generator Implant Date: 20150730
Lead Channel Pacing Threshold Amplitude: 0.75 V
Lead Channel Pacing Threshold Amplitude: 1 V
Lead Channel Pacing Threshold Amplitude: 2.75 V
Lead Channel Pacing Threshold Pulse Width: 0.5 ms
Lead Channel Pacing Threshold Pulse Width: 0.5 ms
Lead Channel Pacing Threshold Pulse Width: 1 ms
Lead Channel Sensing Intrinsic Amplitude: 11.7 mV
Lead Channel Sensing Intrinsic Amplitude: 2.1 mV
Pulse Gen Serial Number: 7147055

## 2019-06-04 ENCOUNTER — Other Ambulatory Visit: Payer: Self-pay | Admitting: Cardiology

## 2019-06-10 ENCOUNTER — Other Ambulatory Visit: Payer: Self-pay | Admitting: Cardiology

## 2019-06-11 NOTE — Telephone Encounter (Signed)
This encounter was created in error - please disregard.

## 2019-06-20 ENCOUNTER — Telehealth: Payer: Self-pay | Admitting: Cardiology

## 2019-06-20 ENCOUNTER — Other Ambulatory Visit (HOSPITAL_COMMUNITY)
Admission: RE | Admit: 2019-06-20 | Discharge: 2019-06-20 | Disposition: A | Discharge status: Home / Self Care | Payer: Medicare PPO | Source: Ambulatory Visit | Attending: Cardiology | Admitting: Cardiology

## 2019-06-20 DIAGNOSIS — Z01812 Encounter for preprocedural laboratory examination: Secondary | ICD-10-CM | POA: Diagnosis present

## 2019-06-20 DIAGNOSIS — Z20822 Contact with and (suspected) exposure to covid-19: Secondary | ICD-10-CM | POA: Diagnosis not present

## 2019-06-20 LAB — SARS CORONAVIRUS 2 (TAT 6-24 HRS): SARS Coronavirus 2: NEGATIVE

## 2019-06-20 MED ORDER — PREDNISONE 50 MG PO TABS: ORAL_TABLET | ORAL | 0 refills | Status: DC

## 2019-06-20 NOTE — Telephone Encounter (Signed)
Made aware of current hospital policy r/t Covid restrictions.  Aware that my understanding is it is only 1 person allowed to stay.  Advised to plan for her to be dropped off for procedure but they can speak with hospital staff that morning to determine if they would allow husband to come in (he has dementia) w/ pt's sister to wait. Pt appreciates return call. Aware I will speak w/ hospital and call if different advisement, other that what was given above.

## 2019-06-20 NOTE — Telephone Encounter (Signed)
Advised pt on dye allergy protocol. Advised to take Prednisone 50 mg 13 hours, 7 hours & 1 hour prior to CRT gen change Monday.  Advised to also take Benadryl 50 mg 1 hour prior to gen change, along w/ Prednisone. Patient verbalized understanding and agreeable to plan.

## 2019-06-20 NOTE — Telephone Encounter (Signed)
Patient calling stating her surgery is scheduled for Monday and she is requesting her sister-in-law Dois Davenport come with her and her husband to her surgery. She states she needs her sister-in-law to sit with her husband Sammuel, because he cannot be left alone. She says she will be leaving around 12:00pm today and will be out tomorrow.

## 2019-06-20 NOTE — Addendum Note (Signed)
Addended by: Baird Lyons on: 06/20/2019 05:13 PM   Modules accepted: Orders

## 2019-06-23 ENCOUNTER — Ambulatory Visit (HOSPITAL_COMMUNITY)
Admission: RE | Admit: 2019-06-23 | Discharge: 2019-06-23 | Disposition: A | Discharge status: Home / Self Care | Payer: Medicare PPO | Attending: Cardiology | Admitting: Cardiology

## 2019-06-23 ENCOUNTER — Other Ambulatory Visit: Payer: Self-pay

## 2019-06-23 ENCOUNTER — Encounter (HOSPITAL_COMMUNITY)
Admission: RE | Disposition: A | Discharge status: Home / Self Care | Payer: Self-pay | Source: Home / Self Care | Attending: Cardiology

## 2019-06-23 DIAGNOSIS — Z91041 Radiographic dye allergy status: Secondary | ICD-10-CM | POA: Insufficient documentation

## 2019-06-23 DIAGNOSIS — Z885 Allergy status to narcotic agent status: Secondary | ICD-10-CM | POA: Insufficient documentation

## 2019-06-23 DIAGNOSIS — M1990 Unspecified osteoarthritis, unspecified site: Secondary | ICD-10-CM | POA: Diagnosis not present

## 2019-06-23 DIAGNOSIS — Z8261 Family history of arthritis: Secondary | ICD-10-CM | POA: Diagnosis not present

## 2019-06-23 DIAGNOSIS — Z888 Allergy status to other drugs, medicaments and biological substances status: Secondary | ICD-10-CM | POA: Diagnosis not present

## 2019-06-23 DIAGNOSIS — H409 Unspecified glaucoma: Secondary | ICD-10-CM | POA: Diagnosis not present

## 2019-06-23 DIAGNOSIS — E785 Hyperlipidemia, unspecified: Secondary | ICD-10-CM | POA: Insufficient documentation

## 2019-06-23 DIAGNOSIS — E1151 Type 2 diabetes mellitus with diabetic peripheral angiopathy without gangrene: Secondary | ICD-10-CM | POA: Diagnosis not present

## 2019-06-23 DIAGNOSIS — Z8673 Personal history of transient ischemic attack (TIA), and cerebral infarction without residual deficits: Secondary | ICD-10-CM | POA: Insufficient documentation

## 2019-06-23 DIAGNOSIS — E7800 Pure hypercholesterolemia, unspecified: Secondary | ICD-10-CM | POA: Insufficient documentation

## 2019-06-23 DIAGNOSIS — I110 Hypertensive heart disease with heart failure: Secondary | ICD-10-CM | POA: Diagnosis not present

## 2019-06-23 DIAGNOSIS — I5042 Chronic combined systolic (congestive) and diastolic (congestive) heart failure: Secondary | ICD-10-CM | POA: Insufficient documentation

## 2019-06-23 DIAGNOSIS — Z006 Encounter for examination for normal comparison and control in clinical research program: Secondary | ICD-10-CM | POA: Diagnosis not present

## 2019-06-23 DIAGNOSIS — M069 Rheumatoid arthritis, unspecified: Secondary | ICD-10-CM | POA: Insufficient documentation

## 2019-06-23 DIAGNOSIS — Z7902 Long term (current) use of antithrombotics/antiplatelets: Secondary | ICD-10-CM | POA: Insufficient documentation

## 2019-06-23 DIAGNOSIS — Z4502 Encounter for adjustment and management of automatic implantable cardiac defibrillator: Secondary | ICD-10-CM | POA: Insufficient documentation

## 2019-06-23 DIAGNOSIS — I428 Other cardiomyopathies: Secondary | ICD-10-CM | POA: Diagnosis not present

## 2019-06-23 DIAGNOSIS — I2510 Atherosclerotic heart disease of native coronary artery without angina pectoris: Secondary | ICD-10-CM | POA: Insufficient documentation

## 2019-06-23 DIAGNOSIS — Z8249 Family history of ischemic heart disease and other diseases of the circulatory system: Secondary | ICD-10-CM | POA: Insufficient documentation

## 2019-06-23 DIAGNOSIS — I4891 Unspecified atrial fibrillation: Secondary | ICD-10-CM | POA: Insufficient documentation

## 2019-06-23 DIAGNOSIS — K589 Irritable bowel syndrome without diarrhea: Secondary | ICD-10-CM | POA: Diagnosis not present

## 2019-06-23 HISTORY — PX: BIV ICD GENERATOR CHANGEOUT: EP1194

## 2019-06-23 LAB — BASIC METABOLIC PANEL
Anion gap: 16 — ABNORMAL HIGH (ref 5–15)
BUN: 18 mg/dL (ref 8–23)
CO2: 23 mmol/L (ref 22–32)
Calcium: 9.3 mg/dL (ref 8.9–10.3)
Chloride: 101 mmol/L (ref 98–111)
Creatinine, Ser: 1.17 mg/dL — ABNORMAL HIGH (ref 0.44–1.00)
GFR calc Af Amer: 50 mL/min — ABNORMAL LOW (ref >60)
GFR calc non Af Amer: 43 mL/min — ABNORMAL LOW (ref >60)
Glucose, Bld: 296 mg/dL — ABNORMAL HIGH (ref 70–99)
Potassium: 3.4 mmol/L — ABNORMAL LOW (ref 3.5–5.1)
Sodium: 140 mmol/L (ref 135–145)

## 2019-06-23 LAB — CBC
HCT: 34.7 % — ABNORMAL LOW (ref 36.0–46.0)
Hemoglobin: 11.6 g/dL — ABNORMAL LOW (ref 12.0–15.0)
MCH: 30.9 pg (ref 26.0–34.0)
MCHC: 33.4 g/dL (ref 30.0–36.0)
MCV: 92.5 fL (ref 80.0–100.0)
Platelets: 152 10*3/uL (ref 150–400)
RBC: 3.75 MIL/uL — ABNORMAL LOW (ref 3.87–5.11)
RDW: 13.2 % (ref 11.5–15.5)
WBC: 4 10*3/uL (ref 4.0–10.5)
nRBC: 0 % (ref 0.0–0.2)

## 2019-06-23 LAB — GLUCOSE, CAPILLARY: Glucose-Capillary: 305 mg/dL — ABNORMAL HIGH (ref 70–99)

## 2019-06-23 SURGERY — BIV ICD GENERATOR CHANGEOUT
Anesthesia: LOCAL

## 2019-06-23 MED ORDER — SODIUM CHLORIDE 0.9 % IV SOLN: INTRAVENOUS | Status: DC

## 2019-06-23 MED ORDER — SODIUM CHLORIDE 0.9 % IV SOLN
INTRAVENOUS | Status: AC
  Filled 2019-06-23: qty 2

## 2019-06-23 MED ORDER — ONDANSETRON HCL 4 MG/2ML IJ SOLN: 4.0000 mg | Freq: Four times a day (QID) | INTRAMUSCULAR | Status: DC | PRN

## 2019-06-23 MED ORDER — CEFAZOLIN SODIUM-DEXTROSE 2-4 GM/100ML-% IV SOLN
INTRAVENOUS | Status: AC
  Filled 2019-06-23: qty 100

## 2019-06-23 MED ORDER — CEFAZOLIN SODIUM-DEXTROSE 2-4 GM/100ML-% IV SOLN
2.0000 g | INTRAVENOUS | Status: AC
  Administered 2019-06-23: 2 g via INTRAVENOUS

## 2019-06-23 MED ORDER — LIDOCAINE HCL (PF) 1 % IJ SOLN
INTRAMUSCULAR | Status: DC | PRN
  Administered 2019-06-23: 40 mL

## 2019-06-23 MED ORDER — SODIUM CHLORIDE 0.9 % IV SOLN
80.0000 mg | INTRAVENOUS | Status: AC
  Administered 2019-06-23: 80 mg

## 2019-06-23 MED ORDER — LIDOCAINE HCL 1 % IJ SOLN
INTRAMUSCULAR | Status: AC
  Filled 2019-06-23: qty 60

## 2019-06-23 MED ORDER — CHLORHEXIDINE GLUCONATE 4 % EX LIQD: 4.0000 "application " | Freq: Once | CUTANEOUS | Status: DC

## 2019-06-23 MED ORDER — ACETAMINOPHEN 325 MG PO TABS: 325.0000 mg | ORAL_TABLET | ORAL | Status: DC | PRN

## 2019-06-23 SURGICAL SUPPLY — 6 items
CABLE SURGICAL S-101-97-12 (CABLE) ×2 IMPLANT
DEVICE DISSECT PLASMABLAD 3.0S (MISCELLANEOUS) ×1 IMPLANT
ICD GALLANT HFCRTD CDHFA500Q (ICD Generator) ×2 IMPLANT
PAD PRO RADIOLUCENT 2001M-C (PAD) ×2 IMPLANT
PLASMABLADE 3.0S (MISCELLANEOUS) ×2
TRAY PACEMAKER INSERTION (PACKS) ×2 IMPLANT

## 2019-06-23 NOTE — Discharge Instructions (Signed)

## 2019-06-23 NOTE — H&P (Signed)
Electrophysiology Office Note   Date:  06/23/2019   ID:  Glendoris, Nodarse 1935-01-10, MRN [id number]  PCP:  Olive Bass, MD  Cardiologist:  Dulce Sellar Primary Electrophysiologist:  Shelma Eiben [name], MD    No chief complaint on file.    History of Present Illness: Shari [name] is a 85 y.o. female who is being seen today for the evaluation of CHF at the request of No ref. provider found. Presenting today for electrophysiology evaluation. She has a history of hypertensive heart disease, mild coronary artery disease, chronic combined systolic and diastolic heart failure, hyperlipidemia.  She has a BiV ICD in place.  Today, denies symptoms of palpitations, chest pain, shortness of breath, orthopnea, PND, lower extremity edema, claudication, dizziness, presyncope, syncope, bleeding, or neurologic sequela. The patient is tolerating medications without difficulties. Plan for ICD generator change today.  Past Medical History:  Diagnosis Date  . Anxiety   . Automatic implantable cardioverter-defibrillator in situ 10/25/2014   Overview:  Overview:  dual chamber ICD,upgraded to CRT with LBBB and nonischemic cardiomyopathy  . Biventricular ICD (implantable cardioverter-defibrillator) in place 10/25/2014   Overview:  BIV -ICD,upgraded to CRT with LBBB and nonischemic cardiomyopathy  . Chronic coronary artery disease 10/25/2014  . Costochondritis 10/25/2014  . Diabetes mellitus 2 years  . Diverticulitis   . Elevated troponin 06/04/2016   Last Assessment & Plan:  Likely may be related to ventricular pacing and underlying cardiomyopathy.  The trajectory of the biomarker elevation and neither is her clinical history suggestive of acute coronary syndrome.  Further stratification with nuclear stress testing  . GERD (gastroesophageal reflux disease)   . Glaucoma   . High cholesterol   . Hypertension   . Hypertensive heart disease with heart failure (HCC) 10/25/2014  . Hypokalemia 06/04/2016   Last  Assessment & Plan:  Replace with potassium chloride.  . IBS (irritable bowel syndrome)   . 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 Mild CAD 10/25/2014  . Onychomycosis 11/10/2010  . Osteoarthritis   . PAD (peripheral artery disease) (HCC) 11/03/2014   Overview:  status post insertion of aortic stent and left common iliac stent by Dr. Imogene Burn in August of 2016  . Peripheral arterial occlusive disease (HCC) 11/03/2014  . Precordial chest pain 06/04/2016   Last Assessment & Plan:  My suspicion is that this is likely related to gastroesophageal reflux disease with acute indigestion last evening.  The biomarker elevation is not completely unexpected in this elderly patient with reported cardiomyopathy and current demand ventricular pacing.  Nonetheless she does have a history of extensive vascular disease (though apparently no significant coronary atherosclerosis in 2013) so we Donnika Kucher proceed with a noninvasive risk stratification with pharmacological nuclear stress testing.  Assuming this is a low risk or normal study then I would not advise any invasive cardiac evaluation at this time.  . Preoperative cardiovascular examination 04/02/2015  . Rheumatoid arthritis involving multiple sites (HCC) 05/21/2015  . Stroke (HCC)   . Vitamin D deficiency 06/20/2016   Past Surgical History:  Procedure Laterality Date  . FOOT SURGERY    . pacemaker surgery     a-fib and was done july 2014  . PERIPHERAL VASCULAR CATHETERIZATION N/A 11/05/2014   Procedure: Abdominal Aortogram;  Surgeon: Fransisco Hertz, MD;  Location: [facility] INVASIVE CV LAB;  Service: Cardiovascular;  Laterality: N/A;     Current Facility-Administered Medications  Medication Dose Route Frequency Provider Last Rate Last Admin  . 0.9 %  sodium chloride infusion   Intravenous Continuous Constance Haw, MD 50 mL/hr at 06/23/19 0827 New Bag at 06/23/19 0827  . ceFAZolin (ANCEF) IVPB 2g/100 mL premix  2 g Intravenous On Call  Treina Arscott Hassell Done, MD      . chlorhexidine (HIBICLENS) 4 % liquid 4 application  4 application Topical Once Deana Krock Hassell Done, MD      . gentamicin (GARAMYCIN) 80 mg in sodium chloride 0.9 % 500 mL irrigation  80 mg Irrigation On Call Clydia Nieves Hassell Done, MD        Allergies:   Codeine, Dulaglutide, Ivp dye [iodinated diagnostic agents], Metformin, Metrizamide, and Pioglitazone   Social History:  The patient  reports that she has never smoked. She has never used smokeless tobacco. She reports that she does not drink alcohol or use drugs.   Family History:  The patient's family history includes Alzheimer's disease in her mother; Arthritis in her mother and sister; Bladder Cancer in her father; Heart attack in her maternal grandfather and maternal grandmother; Hyperlipidemia in her father and mother; Vitiligo in her sister.    ROS:  Please see the history of present illness.   Otherwise, review of systems is positive for none.   All other systems are reviewed and negative.   PHYSICAL EXAM: VS:  BP (!) 153/72   Pulse 88   Temp (!) 97.2 F (36.2 C) (Skin)   Resp 14   Ht 5\' 2"  (1.575 m)   Wt 53.1 kg   SpO2 100%   BMI 21.40 kg/m  , BMI Body mass index is 21.4 kg/m. GEN: Well nourished, well developed, in no acute distress  HEENT: normal  Neck: no JVD, carotid bruits, or masses Cardiac: RRR; no murmurs, rubs, or gallops,no edema  Respiratory:  clear to auscultation bilaterally, normal work of breathing GI: soft, nontender, nondistended, + BS MS: no deformity or atrophy  Skin: warm and dry, device site well healed Neuro:  Strength and sensation are intact Psych: euthymic mood, full affect   EKG:  EKG is ordered today. Personal review of the ekg ordered shows SR, V paced  Personal review of the device interrogation today. Results in [place]: 06/23/2019: BUN 18; Creatinine, Ser 1.17; Hemoglobin 11.6; Platelets 152; Potassium 3.4; Sodium 140    Lipid Panel  No  results found for: CHOL, TRIG, HDL, CHOLHDL, VLDL, LDLCALC, LDLDIRECT   Wt Readings from Last 3 Encounters:  06/23/19 53.1 kg  05/13/19 55.8 kg  03/20/19 54.4 kg      Other studies Reviewed: Additional studies/ records that were reviewed today include: TTE 05/22/2017 Review of the above records today demonstrates:  - Left ventricle: The cavity size was normal. Systolic function was   normal. The estimated ejection fraction was in the range of 60%   to 65%. Wall motion was normal; there were no regional wall   motion abnormalities. - Aortic valve: There was mild regurgitation. Valve area (VTI):   1.83 cm^2. Valve area (Vmax): 1.72 cm^2. Valve area (Vmean): 1.71   cm^2.   ASSESSMENT AND PLAN:  1.  Chronic systolic and diastolic heart failure: Patient is status post [place] CRT-D.   ICD Criteria  Current LVEF:60-65%. Within 12 months prior to implant: No   Heart failure history: Yes, Class I  Cardiomyopathy history: Yes, Non-Ischemic Cardiomyopathy.  Atrial Fibrillation/Atrial Flutter: No.  Ventricular tachycardia history: No.  Cardiac arrest history: No.  History of syndromes with risk of sudden death: No.  Previous ICD:  Yes, Reason for ICD:  Primary prevention.  Current ICD indication: Primary  PPM indication: No.  Class I or II Bradycardia indication present: No  Beta Blocker therapy for 3 or more months: Yes, prescribed.   Ace Inhibitor/ARB therapy for 3 or more months: Yes, prescribed.    I have seen KYA MAYFIELD is a 85 y.o. femalepre-procedural and has been referred by [facility] for consideration of ICD implant for primary prevention of sudden death.  The patient's chart has been reviewed and they meet criteria for ICD implant.  I have had a thorough discussion with the patient reviewing options.  The patient and their family (if available) have had opportunities to ask questions and have them answered. The patient and I have decided together through the  [facility] Share Decision Support Tool to implant ICD at this time.  Risks, benefits, alternatives to ICD implantation were discussed in detail with the patient today. The patient  understands that the risks include but are not limited to bleeding, infection, pneumothorax, perforation, tamponade, vascular damage, renal failure, MI, stroke, death, inappropriate shocks, and lead dislodgement and  wishes to proceed.    2. Hypertension: Mildly elevated today but usually well controlled.  We Keith Felten make no changes at this time.  3. Hyperlipidemia: Continue atorvastatin.  4. Coronary artery disease: Currently on Plavix.  No current chest pain.  Continue with current management.

## 2019-06-23 NOTE — Progress Notes (Signed)
Patient took Prednisone 50 mg and Benadryl 25 mg from home.

## 2019-07-06 NOTE — Progress Notes (Addendum)
Cardiology Office Note Date:  07/07/2019  Patient ID:  Shari [name], Shari [name] [birth year], MRN [id number] PCP:  Olive Bass, MD  Cardiologist:  Dr. Dulce Sellar EP: Dr. Elberta Fortis    Chief Complaint: wound check visit  History of Present Illness: Shari [name] is a 85 y.o. female with history of HT, hypertensive heart disease, non-obstructive CAD, chronic CHF (combined), HLD, DM, PVD (status post insertion of aortic stent and left common iliac stent by Dr. Imogene Burn in August of 2016), stroke, RA.  She come sin today to be seen for Dr. Elberta Fortis.  Last seen by him 05/13/2019, at that time doing well, device had reached ERI and planned for gen change.  She is doing well.  [place] and worries if her device APP is working.  She denies any wound concerns, mentions the dressing is a little irritation to her skin only.    She is active at her home, no formal exercise.  Some days does more then others.  No CP, palpitations.  Thinks he new device is better, feels less often the need to get a good deep breath in. No dizzy spells, near syncope or syncope.   Device information SJM dual chamber ICD implanted 2014 (RA/RV lead) > > CRT (LV lead) 2015 >> gen change 06/23/2019   Past Medical History:  Diagnosis Date  . Anxiety   . Automatic implantable cardioverter-defibrillator in situ 10/25/2014   Overview:  Overview:  dual chamber ICD,upgraded to CRT with LBBB and nonischemic cardiomyopathy  . Biventricular ICD (implantable cardioverter-defibrillator) in place 10/25/2014   Overview:  BIV -ICD,upgraded to CRT with LBBB and nonischemic cardiomyopathy  . Chronic coronary artery disease 10/25/2014  . Costochondritis 10/25/2014  . Diabetes mellitus 2 years  . Diverticulitis   . Elevated troponin 06/04/2016   Last Assessment & Plan:  Likely may be related to ventricular pacing and underlying cardiomyopathy.  The trajectory of the biomarker elevation and neither is her clinical history  suggestive of acute coronary syndrome.  Further stratification with nuclear stress testing  . GERD (gastroesophageal reflux disease)   . Glaucoma   . High cholesterol   . Hypertension   . Hypertensive heart disease with heart failure (HCC) 10/25/2014  . Hypokalemia 06/04/2016   Last Assessment & Plan:  Replace with potassium chloride.  . IBS (irritable bowel syndrome)   . 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 Mild CAD 10/25/2014  . Onychomycosis 11/10/2010  . Osteoarthritis   . PAD (peripheral artery disease) (HCC) 11/03/2014   Overview:  status post insertion of aortic stent and left common iliac stent by Dr. Imogene Burn in August of 2016  . Peripheral arterial occlusive disease (HCC) 11/03/2014  . Precordial chest pain 06/04/2016   Last Assessment & Plan:  My suspicion is that this is likely related to gastroesophageal reflux disease with acute indigestion last evening.  The biomarker elevation is not completely unexpected in this elderly patient with reported cardiomyopathy and current demand ventricular pacing.  Nonetheless she does have a history of extensive vascular disease (though apparently no significant coronary atherosclerosis in 2013) so we will proceed with a noninvasive risk stratification with pharmacological nuclear stress testing.  Assuming this is a low risk or normal study then I would not advise any invasive cardiac evaluation at this time.  . Preoperative cardiovascular examination 04/02/2015  . Rheumatoid arthritis involving multiple sites (HCC) 05/21/2015  . Stroke (HCC)   . Vitamin D deficiency 06/20/2016  Past Surgical History:  Procedure Laterality Date  . BIV ICD GENERATOR CHANGEOUT N/A 06/23/2019   Procedure: BIV ICD GENERATOR CHANGEOUT;  Surgeon: Constance Haw, MD;  Location: [place];  Service: Cardiovascular;  Laterality: N/A;  . FOOT SURGERY    . pacemaker surgery     a-fib and was done july 2014  . PERIPHERAL VASCULAR  CATHETERIZATION N/A 11/05/2014   Procedure: Abdominal Aortogram;  Surgeon: Conrad Ely, MD;  Location: [facility];  Service: Cardiovascular;  Laterality: N/A;    Current Outpatient Medications  Medication Sig Dispense Refill  . acetaminophen (TYLENOL) 650 MG CR tablet Take 1,300 mg by mouth in the morning and at bedtime.    . ALPRAZolam (XANAX) 0.25 MG tablet Take 1 tablet (0.25 mg total) by mouth daily as needed for anxiety. 30 tablet 0  . Ascorbic Acid (VITAMIN C) 1000 MG tablet Take 1,000 mg by mouth daily.    Marland Kitchen atorvastatin (LIPITOR) 40 MG tablet Take 40 mg by mouth at bedtime.     . Calcium Carbonate-Vit D-Min (CALCIUM 1200 PO) Take 1 tablet by mouth daily.     . clopidogrel (PLAVIX) 75 MG tablet TAKE ONE TABLET BY MOUTH EVERY DAY (Patient taking differently: Take 75 mg by mouth daily. ) 90 tablet 2  . DEXILANT 60 MG capsule Take 60 mg by mouth daily.     . furosemide (LASIX) 80 MG tablet Take 2 tablets (160 mg total) by mouth 2 (two) times daily. (Patient taking differently: Take 80 mg by mouth 2 (two) times daily. ) 360 tablet 1  . glipiZIDE (GLUCOTROL) 10 MG tablet Take 10 mg by mouth 2 (two) times daily before a meal.     . isosorbide mononitrate (IMDUR) 60 MG 24 hr tablet Take 1 tablet (60 mg total) by mouth daily. (Patient taking differently: Take 60 mg by mouth at bedtime. ) 90 tablet 2  . JANUVIA 25 MG tablet Take 25 mg by mouth daily.    Marland Kitchen ketoconazole (NIZORAL) 2 % shampoo Apply 1 application topically 2 (two) times a week.    . meclizine (ANTIVERT) 12.5 MG tablet Take 1 tablet daily as needed for dizziness. (Patient taking differently: Take 12.5 mg by mouth 2 (two) times daily as needed for dizziness. ) 30 tablet 1  . metoprolol succinate (TOPROL XL) 25 MG 24 hr tablet Take 1 tablet (25 mg total) by mouth daily. 30 tablet 5  . montelukast (SINGULAIR) 10 MG tablet Take 10 mg by mouth at bedtime.     . Multiple Vitamin (MULTIVITAMIN PO) Take 1 tablet by mouth daily.     .  nitroGLYCERIN (NITROSTAT) 0.4 MG SL tablet Place 1 tablet (0.4 mg total) under the tongue every 5 (five) minutes as needed. 25 tablet 6  . potassium chloride (KLOR-CON) 10 MEQ tablet Take 1 tablet (10 mEq total) by mouth daily. 90 tablet 3  . Propylene Glycol-Glycerin (MOISTURE EYES OP) Place 1 drop into both eyes daily as needed (Moisture eyes). thera tears    . ramipril (ALTACE) 10 MG capsule Take 10 mg by mouth daily.      . ranolazine (RANEXA) 500 MG 12 hr tablet Take 1 tablet (500 mg total) by mouth daily. (Patient taking differently: Take 500 mg by mouth 2 (two) times daily. ) 90 tablet 1  . sodium chloride (MURO 128) 5 % ophthalmic solution Place 1 drop into both eyes as needed for eye irritation.    . terbinafine (LAMISIL) 250 MG  tablet Take 250 mg by mouth daily as needed (Dermatitis on scalp).     . VASCEPA 1 g CAPS Take 2 g by mouth 2 (two) times daily.   3  . Vitamin D, Ergocalciferol, (DRISDOL) 50000 units CAPS capsule Take 50,000 Units by mouth every 14 (fourteen) days.      No current facility-administered medications for this visit.    Allergies:   Codeine, Dulaglutide, Ivp dye [iodinated diagnostic agents], Metformin, Metrizamide, and Pioglitazone   Social History:  The patient  reports that she has never smoked. She has never used smokeless tobacco. She reports that she does not drink alcohol or use drugs.   Family History:  The patient's family history includes Alzheimer's disease in her mother; Arthritis in her mother and sister; Bladder Cancer in her father; Heart attack in her maternal grandfather and maternal grandmother; Hyperlipidemia in her father and mother; Vitiligo in her sister.  ROS:  Please see the history of present illness. All other systems are reviewed and otherwise negative.   PHYSICAL EXAM:  VS:  BP 118/62   Pulse 68   Ht 5\' 2"  (1.575 m)   Wt 119 lb (54 kg)   SpO2 96%   BMI 21.77 kg/m  BMI: Body mass index is 21.77 kg/m. Well nourished, well  developed, in no acute distress  HEENT: normocephalic, atraumatic  Neck: no JVD, carotid bruits or masses Cardiac:  RRR; no significant murmurs, no rubs, or gallops Lungs:  CTA b/l, no wheezing, rhonchi or rales  Abd: soft, nontender MS: no deformity, age appropriate atrophy, very petite body habitus Ext: no edema  Skin: warm and dry, no rash Neuro:  No gross deficits appreciated Psych: euthymic mood, full affect  ICD site is stable tegaderm remains on her site, is removed without difficulty, skin looks OK. Steri strips removed.  Skin edges are well approximated. No erythema, edema, no drainage, no swelling or heat to the surrounding tissues.  Non-tender   EKG:  Not done today  ICD interrogation done today and reviewed by myself:  Battery and lead measurements are stable AMS episode dated 07/04/29 is false for noise on A lead. A lead measure,emnts are good Unable to reproduce today with isometrics, arm movements or pocket manipulation    05/22/2017: TTE Study Conclusions  - Left ventricle: The cavity size was normal. Systolic function was  normal. The estimated ejection fraction was in the range of 60%  to 65%. Wall motion was normal; there were no regional wall  motion abnormalities.  - Aortic valve: There was mild regurgitation. Valve area (VTI):  1.83 cm^2. Valve area (Vmax): 1.72 cm^2. Valve area (Vmean): 1.71  cm^2.   Impressions:  - 1. Left ventricular systolic function is preserved visually  estimated ejection fraction is 60-65%. Impaired left ventricular  relaxation.  2. Mild aortic regurgitation.     Recent Labs: 06/23/2019: BUN 18; Creatinine, Ser 1.17; Hemoglobin 11.6; Platelets 152; Potassium 3.4; Sodium 140  No results found for requested labs within last 8760 hours.   Estimated Creatinine Clearance: 29.3 mL/min (A) (by C-G formula based on SCr of 1.17 mg/dL (H)).   Wt Readings from Last 3 Encounters:  07/07/19 119 lb (54 kg)  06/23/19  117 lb (53.1 kg)  05/13/19 123 lb (55.8 kg)     Other studies reviewed: Additional studies/records reviewed today include: summarized above  ASSESSMENT AND PLAN:  1. CDT-D     S/p gen change     Would healing well, no signs of infection  No programming changes made     Single event of noise on A channle, lead measurements are good  [employer] rep is on site, notes good transmission via her device APP in the [employer] system.  2. Chronic CHF (combined)     Recovered LVEF     No symptoms or exam findings to suggest volume OL     meds deferred to Dr. Dulce Sellar, due to see him in May  3. HTN     Looks good, no changes  Disposition: F/u with Dr. Dulce Sellar and Dr. Elberta Fortis as scheduled, sooner if needed, and  remotes Q 3 mo.  Current medicines are reviewed at length with the patient today.  The patient did not have any concerns regarding medicines.  Norma Fredrickson, PA-C 07/07/2019 4:11 PM     [facility] [address] [place] [address] [phone] (office)  [phone] (fax)

## 2019-07-07 ENCOUNTER — Ambulatory Visit: Payer: Medicare PPO | Admitting: Physician Assistant

## 2019-07-07 ENCOUNTER — Other Ambulatory Visit: Payer: Self-pay

## 2019-07-07 VITALS — BP 118/62 | HR 68 | Ht 62.0 in | Wt 119.0 lb

## 2019-07-07 DIAGNOSIS — Z95810 Presence of automatic (implantable) cardiac defibrillator: Secondary | ICD-10-CM | POA: Diagnosis not present

## 2019-07-07 DIAGNOSIS — I10 Essential (primary) hypertension: Secondary | ICD-10-CM | POA: Diagnosis not present

## 2019-07-07 DIAGNOSIS — Z4889 Encounter for other specified surgical aftercare: Secondary | ICD-10-CM | POA: Diagnosis not present

## 2019-07-07 DIAGNOSIS — I5043 Acute on chronic combined systolic (congestive) and diastolic (congestive) heart failure: Secondary | ICD-10-CM | POA: Diagnosis not present

## 2019-07-07 NOTE — Patient Instructions (Addendum)
Medication Instructions:  Your physician recommends that you continue on your current medications as directed. Please refer to the Current Medication list given to you today.  *If you need a refill on your cardiac medications before your next appointment, please call your pharmacy*   Lab Work: NONE ORDERED  TODAY   If you have labs (blood work) drawn today and your tests are completely normal, you will receive your results only by: Marland Kitchen MyChart Message (if you have MyChart) OR . A paper copy in the mail If you have any lab test that is abnormal or we need to change your treatment, we will call you to review the results.   Testing/Procedures: NONE ORDERED  TODAY  Follow-Up: At [facility], you and your health needs are our priority.  As part of our continuing mission to provide you with exceptional heart care, we have created designated Provider Care Teams.  These Care Teams include your primary Cardiologist (physician) and Advanced Practice Providers (APPs -  Physician Assistants and Nurse Practitioners) who all work together to provide you with the care you need, when you need it.  We recommend signing up for the patient portal called "MyChart".  Sign up information is provided on this After Visit Summary.  MyChart is used to connect with patients for Virtual Visits (Telemedicine).  Patients are able to view lab/test results, encounter notes, upcoming appointments, etc.  Non-urgent messages can be sent to your provider as well.   To learn more about what you can do with MyChart, go to [email or web address].    Your next appointment:   Dr Dulce Sellar in May   and Dr Elberta Fortis  August  Phys defib (post device implant)   The format for your next appointment:    Other Instructions

## 2019-07-08 ENCOUNTER — Other Ambulatory Visit: Payer: Self-pay | Admitting: Cardiology

## 2019-08-01 ENCOUNTER — Other Ambulatory Visit: Payer: Self-pay | Admitting: Cardiology

## 2019-08-07 DIAGNOSIS — I639 Cerebral infarction, unspecified: Secondary | ICD-10-CM | POA: Insufficient documentation

## 2019-08-07 NOTE — Progress Notes (Signed)
Cardiology Office Note:    Date:  08/08/2019   ID:  Shari [name], DOB [birth year], MRN [id number]  PCP:  Olive Bass, MD  Cardiologist:  Norman Herrlich, MD    Referring MD: Olive Bass, MD    ASSESSMENT:    1. Chronic combined systolic and diastolic heart failure (HCC)   2. Hypertensive heart and kidney disease with heart failure and with chronic kidney disease stage III (HCC)   3. Biventricular ICD (implantable cardioverter-defibrillator) in place   4. Stage 3 chronic kidney disease, unspecified whether stage 3a or 3b CKD    PLAN:    In order of problems listed above:  1. In general she is done better her heart failure is nicely compensated she has no fluid overload continue her current medical treatment including loop diuretic beta-blocker and ACE inhibitor. Her EF normalized and I think Sherryll Burger is indicated here. Part of her marked improvement is cardiac resynchronization pacemaker function is normal followed in the device clinic 2. BP at target continue current treatment heart failure compensated continue her current loop diuretic and she has stable CKD stage III at a lower dose of loop diuretic 3. Stable device therapy managed by device clinic 4. Hyperlipidemia stable LDL at target she has mild CAD nonobstructive and is having no angina on current medical therapy including beta-blocker oral nitrates and statin   Next appointment: 6 months   Medication Adjustments/Labs and Tests Ordered: Current medicines are reviewed at length with the patient today.  Concerns regarding medicines are outlined above.  No orders of the defined types were placed in this encounter.  No orders of the defined types were placed in this encounter.   Chief Complaint  Patient presents with   Follow-up   Congestive Heart Failure    History of Present Illness:    Shari [name] is a 85 y.o. female with a hx of LBBB, CHF EF 77% 06/04/16 s/p CRT-D, DLD, HTN, CKD3, PAD with distal aortic and  left common iliac artery stent 2016, mild coronary atherosclerosis, costal chondritis, DM2  last seen 03/30/2019.  Generator change performed 06/23/2019.  She follows with nephrology [facility] for stage III CKD. Compliance with diet, lifestyle and medications: Yes  In general she is feeling well no edema shortness of breath chest pain palpitation or syncope. Unfortunately she has had extreme stress  With her husband's illness death of her son normal and ongoing concerns about living independently.  Recent labs [facility] 06/02/2019 Potassium 3.6 creatinine 1.29 cholesterol 146 HDL 29 LDL 43 hemoglobin 11.8 Past Medical History:  Diagnosis Date   Anxiety    Automatic implantable cardioverter-defibrillator in situ 10/25/2014   Overview:  Overview:  dual chamber ICD,upgraded to CRT with LBBB and nonischemic cardiomyopathy   Biventricular ICD (implantable cardioverter-defibrillator) in place 10/25/2014   Overview:  BIV -ICD,upgraded to CRT with LBBB and nonischemic cardiomyopathy   Chronic combined systolic and diastolic heart failure (HCC) 10/25/2014   Overview:  EF 77% by MUGA 06/15/15  Formatting of this note might be different from the original. Overview:  EF 66% by MUGA 03/12/14  Overview:  Overview:  EF 77% by MUGA 06/15/15  Overview:  EF 66% by MUGA 03/12/14  Overview:  Overview:  EF 77% by MUGA 06/15/15 Formatting of this note might be different from the original. EF 66% by MUGA 03/12/14   Chronic coronary artery disease 10/25/2014   Chronic kidney disease, stage 3 12/05/2016   Combined systolic and diastolic congestive  heart failure ([place]) 10/25/2014   Last Assessment & Plan:  Formatting of this note might be different from the original. Type and degree unknown though patient currently appears to be euvolemic.   Coronary artery disease 10/25/2014   Costochondritis 10/25/2014   Diabetes mellitus 2 years   Diverticulitis    Dizziness 06/30/2018   Last Assessment &  Plan:  Formatting of this note might be different from the original. 85 yo F with history of CHF, DM, PVD, h/o TIA with double vision in 2013, HTN, HLD.   Yesterday morning she reports that when she woke up and looked over at her clock it looked blurry.  She denies double vision, facial droop or extremity weakness.  When she tried to get up to ambulate she reports that she felt d   Dysuria 06/15/2017   Elevated troponin 06/04/2016   Last Assessment & Plan:  Likely may be related to ventricular pacing and underlying cardiomyopathy.  The trajectory of the biomarker elevation and neither is her clinical history suggestive of acute coronary syndrome.  Further stratification with nuclear stress testing   Essential hypertension 06/04/2016   Last Assessment & Plan:  Formatting of this note might be different from the original. Continue carvedilol   Gastroesophageal reflux disease 11/10/2010   Last Assessment & Plan:  Formatting of this note might be different from the original. Continue Dexilant.   GERD (gastroesophageal reflux disease)    Glaucoma    Gout 12/02/2016   High cholesterol    History of ischemic stroke 06/30/2018   Hypertension    Hypertensive heart and kidney disease with heart failure and with chronic kidney disease stage III ([place]) 11/10/2010   Hypertensive heart disease with heart failure ([place]) 10/25/2014   Hypokalemia 06/04/2016   Last Assessment & Plan:  Replace with potassium chloride.   IBS (irritable bowel syndrome)    ICD (implantable cardioverter-defibrillator) in place 10/25/2014   Overview:  Overview:  dual chamber ICD,upgraded to CRT with LBBB and nonischemic cardiomyopathy  Formatting of this note might be different from the original. Overview:  dual chamber ICD,upgraded to CRT with LBBB and nonischemic cardiomyopathy  Overview:  Overview:  BIV -ICD,upgraded to CRT with LBBB and nonischemic cardiomyopathy  Burna Forts, CMA - 09/07/2016 3:00 PM EDT REMOTE MONITORING ASSE    LBBB (left bundle branch block) 12/02/2016   Malignant hypertension with congestive heart failure ([place]) 05/21/2015   Migraine 12/02/2016   Mild CAD 10/25/2014   Mixed hyperlipidemia 11/10/2010   Last Assessment & Plan:  Formatting of this note might be different from the original. Continue statin therapy.   Nephrolithiasis 12/02/2016   Onychomycosis 11/10/2010   Osteoarthritis    PAD (peripheral artery disease) ([place]) 11/03/2014   Overview:  status post insertion of aortic stent and left common iliac stent by Dr. Bridgett Larsson in August of 2016   Peripheral arterial occlusive disease ([place]) 11/03/2014   Precordial chest pain 06/04/2016   Last Assessment & Plan:  My suspicion is that this is likely related to gastroesophageal reflux disease with acute indigestion last evening.  The biomarker elevation is not completely unexpected in this elderly patient with reported cardiomyopathy and current demand ventricular pacing.  Nonetheless she does have a history of extensive vascular disease (though apparently no significant coronary atherosclerosis in 2013) so we will proceed with a noninvasive risk stratification with pharmacological nuclear stress testing.  Assuming this is a low risk or normal study then I would not advise any invasive cardiac evaluation at this time.  Preoperative cardiovascular examination 04/02/2015   Rectal bleeding 11/19/2017   Formatting of this note might be different from the original. 2019   Renal artery stenosis (HCC) 12/02/2016   Rheumatoid arthritis involving multiple sites (HCC) 05/21/2015   Screening for breast cancer 04/02/2015   Stroke [facility])    Type 2 diabetes mellitus (HCC) 11/10/2010   Last Assessment & Plan:  Formatting of this note might be different from the original. Continue glipizide and add sliding scale insulin coverage Formatting of this note might be different from the original. Per 01/28/2019 Podiatry VN.   Vitamin D deficiency 06/20/2016    Past Surgical History:    Procedure Laterality Date   BIV ICD GENERATOR CHANGEOUT N/A 06/23/2019   Procedure: BIV ICD GENERATOR CHANGEOUT;  Surgeon: Regan Lemming, MD;  Location: [facility] INVASIVE CV LAB;  Service: Cardiovascular;  Laterality: N/A;   FOOT SURGERY     pacemaker surgery     a-fib and was done july 2014   PERIPHERAL VASCULAR CATHETERIZATION N/A 11/05/2014   Procedure: Abdominal Aortogram;  Surgeon: Fransisco Hertz, MD;  Location: [facility] INVASIVE CV LAB;  Service: Cardiovascular;  Laterality: N/A;    Current Medications: Current Meds  Medication Sig   acetaminophen (TYLENOL) 650 MG CR tablet Take 1,300 mg by mouth in the morning and at bedtime.   ALPRAZolam (XANAX) 0.25 MG tablet Take 1 tablet (0.25 mg total) by mouth daily as needed for anxiety.   Ascorbic Acid (VITAMIN C) 1000 MG tablet Take 1,000 mg by mouth daily.   atorvastatin (LIPITOR) 40 MG tablet Take 40 mg by mouth at bedtime.    Calcium Carbonate-Vit D-Min (CALCIUM 1200 PO) Take 1 tablet by mouth daily.    clopidogrel (PLAVIX) 75 MG tablet TAKE ONE TABLET BY MOUTH EVERY DAY   DEXILANT 60 MG capsule Take 60 mg by mouth daily.    furosemide (LASIX) 80 MG tablet Take 2 tablets (160 mg total) by mouth 2 (two) times daily.   glipiZIDE (GLUCOTROL) 10 MG tablet Take 10 mg by mouth 2 (two) times daily before a meal.    isosorbide mononitrate (IMDUR) 60 MG 24 hr tablet Take 1 tablet (60 mg total) by mouth daily.   JANUVIA 25 MG tablet Take 25 mg by mouth daily.   ketoconazole (NIZORAL) 2 % shampoo Apply 1 application topically 2 (two) times a week.   meclizine (ANTIVERT) 12.5 MG tablet Take 1 tablet daily as needed for dizziness.   metoprolol succinate (TOPROL-XL) 25 MG 24 hr tablet Take 1 tablet (25 mg total) by mouth daily.   montelukast (SINGULAIR) 10 MG tablet Take 10 mg by mouth at bedtime.   nitroGLYCERIN (NITROSTAT) 0.4 MG SL tablet Place 1 tablet (0.4 mg total) under the tongue every 5 (five) minutes as needed.   potassium  chloride (KLOR-CON) 10 MEQ tablet Take 1 tablet (10 mEq total) by mouth daily.   Propylene Glycol-Glycerin (MOISTURE EYES OP) Place 1 drop into both eyes daily as needed (Moisture eyes). thera tears   ramipril (ALTACE) 10 MG capsule Take 10 mg by mouth daily.     ranolazine (RANEXA) 500 MG 12 hr tablet Take 1 tablet (500 mg total) by mouth daily.   sodium chloride (MURO 128) 5 % ophthalmic solution Place 1 drop into both eyes as needed for eye irritation.   terbinafine (LAMISIL) 250 MG tablet Take 250 mg by mouth daily as needed (Dermatitis on scalp).    VASCEPA 1 g CAPS Take 2 g by mouth 2 (two)  times daily.    Vitamin D, Ergocalciferol, (DRISDOL) 50000 units CAPS capsule Take 50,000 Units by mouth every 14 (fourteen) days.      Allergies:   Codeine, Dulaglutide, Ivp dye [iodinated diagnostic agents], Metformin, Metrizamide, and Pioglitazone   Social History   Socioeconomic History   Marital status: Married    Spouse name: Not on file   Number of children: Not on file   Years of education: Not on file   Highest education level: Not on file  Occupational History   Not on file  Tobacco Use   Smoking status: Never Smoker   Smokeless tobacco: Never Used  Substance and Sexual Activity   Alcohol use: No    Alcohol/week: 0.0 standard drinks   Drug use: No   Sexual activity: Not on file  Other Topics Concern   Not on file  Social History Narrative   Not on file   Social Determinants of Health   Financial Resource Strain:    Difficulty of Paying Living Expenses:   Food Insecurity:    Worried About Programme researcher, broadcasting/film/video in the Last Year:    Barista in the Last Year:   Transportation Needs:    Freight forwarder (Medical):    Lack of Transportation (Non-Medical):   Physical Activity:    Days of Exercise per Week:    Minutes of Exercise per Session:   Stress:    Feeling of Stress :   Social Connections:    Frequency of Communication with  Friends and Family:    Frequency of Social Gatherings with Friends and Family:    Attends Religious Services:    Active Member of Clubs or Organizations:    Attends Engineer, structural:    Marital Status:      Family History: The patient's family history includes Alzheimer's disease in her mother; Arthritis in her mother and sister; Bladder Cancer in her father; Heart attack in her maternal grandfather and maternal grandmother; Hyperlipidemia in her father and mother; Vitiligo in her sister. ROS:   Please see the history of present illness.    All other systems reviewed and are negative.  EKGs/Labs/Other Studies Reviewed:    The following studies were reviewed today:   Recent Labs: 06/23/2019: BUN 18; Creatinine, Ser 1.17; Hemoglobin 11.6; Platelets 152; Potassium 3.4; Sodium 140  Recent Lipid Panel No results found for: CHOL, TRIG, HDL, CHOLHDL, VLDL, LDLCALC, LDLDIRECT  Physical Exam:    VS:  BP 112/60    Pulse 66    Temp (!) 97.4 F (36.3 C)    Ht 5\' 2"  (1.575 m)    Wt 121 lb (54.9 kg)    SpO2 96%    BMI 22.13 kg/m     Wt Readings from Last 3 Encounters:  08/08/19 121 lb (54.9 kg)  07/07/19 119 lb (54 kg)  06/23/19 117 lb (53.1 kg)     GEN:  Well nourished, well developed in no acute distress HEENT: Normal NECK: No JVD; No carotid bruits LYMPHATICS: No lymphadenopathy CARDIAC: =RRR, no murmurs, rubs, gallops RESPIRATORY:  Clear to auscultation without rales, wheezing or rhonchi  ABDOMEN: Soft, non-tender, non-distended MUSCULOSKELETAL:  No edema; No deformity  SKIN: Warm and dry NEUROLOGIC:  Alert and oriented x 3 PSYCHIATRIC:  Normal affect    Signed, 06/25/19, MD  08/08/2019 3:24 PM    [facility]

## 2019-08-08 ENCOUNTER — Other Ambulatory Visit: Payer: Self-pay

## 2019-08-08 ENCOUNTER — Encounter: Payer: Self-pay | Admitting: Cardiology

## 2019-08-08 ENCOUNTER — Ambulatory Visit: Payer: Medicare PPO | Admitting: Cardiology

## 2019-08-08 VITALS — BP 112/60 | HR 66 | Temp 97.4°F | Ht 62.0 in | Wt 121.0 lb

## 2019-08-08 DIAGNOSIS — Z95810 Presence of automatic (implantable) cardiac defibrillator: Secondary | ICD-10-CM

## 2019-08-08 DIAGNOSIS — I5042 Chronic combined systolic (congestive) and diastolic (congestive) heart failure: Secondary | ICD-10-CM | POA: Diagnosis not present

## 2019-08-08 DIAGNOSIS — N183 Chronic kidney disease, stage 3 unspecified: Secondary | ICD-10-CM | POA: Diagnosis not present

## 2019-08-08 DIAGNOSIS — I130 Hypertensive heart and chronic kidney disease with heart failure and stage 1 through stage 4 chronic kidney disease, or unspecified chronic kidney disease: Secondary | ICD-10-CM

## 2019-08-08 NOTE — Patient Instructions (Signed)

## 2019-08-20 ENCOUNTER — Ambulatory Visit: Payer: Medicare PPO | Admitting: Sports Medicine

## 2019-08-20 ENCOUNTER — Other Ambulatory Visit: Payer: Self-pay

## 2019-08-20 DIAGNOSIS — I739 Peripheral vascular disease, unspecified: Secondary | ICD-10-CM

## 2019-08-20 DIAGNOSIS — M79675 Pain in left toe(s): Secondary | ICD-10-CM

## 2019-08-20 DIAGNOSIS — B351 Tinea unguium: Secondary | ICD-10-CM | POA: Diagnosis not present

## 2019-08-20 DIAGNOSIS — M79674 Pain in right toe(s): Secondary | ICD-10-CM | POA: Diagnosis not present

## 2019-08-20 DIAGNOSIS — E1140 Type 2 diabetes mellitus with diabetic neuropathy, unspecified: Secondary | ICD-10-CM

## 2019-08-20 DIAGNOSIS — Q828 Other specified congenital malformations of skin: Secondary | ICD-10-CM

## 2019-08-20 NOTE — Progress Notes (Signed)
Subjective: Shari [name] is a 85 y.o. female patient with history of diabetes who returns to office today complaining of callus skin and long, painful nails  while ambulating in shoes; unable to trim.  Patient also reports that she had an issue with some redness at the right hallux lateral margin thinks because her toenails grew out long and cut into the skin of the other toe applied Neosporin and reports that it is significantly better. Patient states that the glucose reading this morning was 176 and last A1c was 7.5. Patient denies any new changes in medication or new problems at this time.  Saw PCP, Dr. Garlon Hatchet several weeks ago  Patient Active Problem List   Diagnosis Date Noted  . Stroke ([place]) 08/07/2019  . Dizziness 06/30/2018  . History of ischemic stroke 06/30/2018  . Rectal bleeding 11/19/2017  . Dysuria 06/15/2017  . Chronic kidney disease, stage 3 12/05/2016  . Gout 12/02/2016  . Migraine 12/02/2016  . Nephrolithiasis 12/02/2016  . LBBB (left bundle branch block) 12/02/2016  . Renal artery stenosis ([place]) 12/02/2016  . Vitamin D deficiency 06/20/2016  . Elevated troponin 06/04/2016  . Hypokalemia 06/04/2016  . Precordial chest pain 06/04/2016  . Essential hypertension 06/04/2016  . Rheumatoid arthritis involving multiple sites ([place]) 05/21/2015  . Screening for breast cancer 04/02/2015  . PAD (peripheral artery disease) ([place]) 11/03/2014  . Peripheral arterial occlusive disease ([place]) 11/03/2014  . ICD (implantable cardioverter-defibrillator) in place 10/25/2014  . 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 Chronic combined systolic and diastolic heart failure ([place]) 10/25/2014  . Costochondritis 10/25/2014  . Combined systolic and diastolic congestive heart failure ([place]) 10/25/2014  . Coronary artery disease 10/25/2014  . Mixed hyperlipidemia 11/10/2010  . Hypertensive heart and kidney disease with heart failure and with chronic kidney  disease stage III ([place]) 11/10/2010  . ibs 11/10/2010  . Osteoarthritis 11/10/2010  . Diverticulitis 11/10/2010  . Gastroesophageal reflux disease 11/10/2010  . Glaucoma 11/10/2010  . Type 2 diabetes mellitus ([place]) 11/10/2010  . Onychomycosis 11/10/2010   Current Outpatient Medications on File Prior to Visit  Medication Sig Dispense Refill  . acetaminophen (TYLENOL) 650 MG CR tablet Take 1,300 mg by mouth in the morning and at bedtime.    . ALPRAZolam (XANAX) 0.25 MG tablet Take 1 tablet (0.25 mg total) by mouth daily as needed for anxiety. 30 tablet 0  . Ascorbic Acid (VITAMIN C) 1000 MG tablet Take 1,000 mg by mouth daily.    Marland Kitchen atorvastatin (LIPITOR) 40 MG tablet Take 40 mg by mouth at bedtime.     . Calcium Carbonate-Vit D-Min (CALCIUM 1200 PO) Take 1 tablet by mouth daily.     . clopidogrel (PLAVIX) 75 MG tablet TAKE ONE TABLET BY MOUTH EVERY DAY 90 tablet 2  . DEXILANT 60 MG capsule Take 60 mg by mouth daily.     . furosemide (LASIX) 80 MG tablet Take 2 tablets (160 mg total) by mouth 2 (two) times daily. 360 tablet 1  . glipiZIDE (GLUCOTROL) 10 MG tablet Take 10 mg by mouth 2 (two) times daily before a meal.     . isosorbide mononitrate (IMDUR) 60 MG 24 hr tablet Take 1 tablet (60 mg total) by mouth daily. 90 tablet 2  . JANUVIA 25 MG tablet Take 25 mg by mouth daily.    Marland Kitchen ketoconazole (NIZORAL) 2 % shampoo Apply 1 application topically 2 (two) times a week.    . meclizine (ANTIVERT) 12.5 MG tablet Take 1 tablet  daily as needed for dizziness. 30 tablet 1  . metoprolol succinate (TOPROL-XL) 25 MG 24 hr tablet Take 1 tablet (25 mg total) by mouth daily. 90 tablet 0  . montelukast (SINGULAIR) 10 MG tablet Take 10 mg by mouth at bedtime.    . nitroGLYCERIN (NITROSTAT) 0.4 MG SL tablet Place 1 tablet (0.4 mg total) under the tongue every 5 (five) minutes as needed. 25 tablet 6  . potassium chloride (KLOR-CON) 10 MEQ tablet Take 1 tablet (10 mEq total) by mouth daily. 90 tablet 3  .  Propylene Glycol-Glycerin (MOISTURE EYES OP) Place 1 drop into both eyes daily as needed (Moisture eyes). thera tears    . ramipril (ALTACE) 10 MG capsule Take 10 mg by mouth daily.      . ranolazine (RANEXA) 500 MG 12 hr tablet Take 1 tablet (500 mg total) by mouth daily. 90 tablet 1  . sodium chloride (MURO 128) 5 % ophthalmic solution Place 1 drop into both eyes as needed for eye irritation.    . terbinafine (LAMISIL) 250 MG tablet Take 250 mg by mouth daily as needed (Dermatitis on scalp).     . VASCEPA 1 g CAPS Take 2 g by mouth 2 (two) times daily.   3  . Vitamin D, Ergocalciferol, (DRISDOL) 50000 units CAPS capsule Take 50,000 Units by mouth every 14 (fourteen) days.      No current facility-administered medications on file prior to visit.   Allergies  Allergen Reactions  . Codeine Diarrhea, Nausea And Vomiting, Other (See Comments) and Hives    All-over body aches, too Other reaction(s): GI intolerance  . Dulaglutide Other (See Comments)    Chest pain, N/V  . Ivp Dye [Iodinated Diagnostic Agents] Nausea And Vomiting and Other (See Comments)  . Metformin Other (See Comments)  . Metrizamide Nausea And Vomiting and Other (See Comments)  . Pioglitazone Other (See Comments)    Has CHF    Recent Results (from the past 2160 hour(s))  SARS CORONAVIRUS 2 (TAT 6-24 HRS) Nasopharyngeal Nasopharyngeal Swab     Status: None   Collection Time: 06/20/19  2:04 PM   Specimen: Nasopharyngeal Swab  Result Value Ref Range   SARS Coronavirus 2 NEGATIVE NEGATIVE    Comment: (NOTE) SARS-CoV-2 target nucleic acids are NOT DETECTED. The SARS-CoV-2 RNA is generally detectable in upper and lower respiratory specimens during the acute phase of infection. Negative results do not preclude SARS-CoV-2 infection, do not rule out co-infections with other pathogens, and should not be used as the sole basis for treatment or other patient management decisions. Negative results must be combined with clinical  observations, patient history, and epidemiological information. The expected result is Negative. Fact Sheet for Patients: HairSlick.no Fact Sheet for Healthcare Providers: quierodirigir.com This test is not yet approved or cleared by the [place] FDA and  has been authorized for detection and/or diagnosis of SARS-CoV-2 by FDA under an Emergency Use Authorization (EUA). This EUA will remain  in effect (meaning this test can be used) for the duration of the COVID-19 declaration under Section 56 4(b)(1) of the Act, 21 U.S.C. section 360bbb-3(b)(1), unless the authorization is terminated or revoked sooner. Performed at [facility], [address], [place], [place] [address]   Glucose, capillary     Status: Abnormal   Collection Time: 06/23/19  7:38 AM  Result Value Ref Range   Glucose-Capillary 305 (H) 70 - 99 mg/dL    Comment: Glucose reference range applies only to samples taken  after fasting for at least 8 hours.   Comment 1 Notify RN    Comment 2 Document in Chart   CBC     Status: Abnormal   Collection Time: 06/23/19  8:26 AM  Result Value Ref Range   WBC 4.0 4.0 - 10.5 K/uL   RBC 3.75 (L) 3.87 - 5.11 MIL/uL   Hemoglobin 11.6 (L) 12.0 - 15.0 g/dL   HCT 93.2 (L) 67.1 - 24.5 %   MCV 92.5 80.0 - 100.0 fL   MCH 30.9 26.0 - 34.0 pg   MCHC 33.4 30.0 - 36.0 g/dL   RDW 80.9 98.3 - 38.2 %   Platelets 152 150 - 400 K/uL   nRBC 0.0 0.0 - 0.2 %    Comment: Performed at [facility], [address], [place], [place] [address]  Basic metabolic panel     Status: Abnormal   Collection Time: 06/23/19  8:26 AM  Result Value Ref Range   Sodium 140 135 - 145 mmol/L   Potassium 3.4 (L) 3.5 - 5.1 mmol/L   Chloride 101 98 - 111 mmol/L   CO2 23 22 - 32 mmol/L   Glucose, Bld 296 (H) 70 - 99 mg/dL    Comment: Glucose reference range applies only to samples taken after fasting for at least 8 hours.   BUN 18 8 - 23 mg/dL    Creatinine, Ser 7.67 (H) 0.44 - 1.00 mg/dL   Calcium 9.3 8.9 - 34.1 mg/dL   GFR calc non Af Amer 43 (L) >60 mL/min   GFR calc Af Amer 50 (L) >60 mL/min   Anion gap 16 (H) 5 - 15    Comment: Performed at [facility], [address], [place], [place] [address]    Objective: General: Patient is awake, alert, and oriented x 3 and in no acute distress.  Integument: Skin is warm, dry and supple bilateral. Nails are tender, long, thickened and dystrophic with subungual debris, consistent with onychomycosis, 2-5 bilateral. There are no nails on bilateral hallux, or very limited nail due to previous nail procedure bilateral but there is blanchable erythema at the right hallux lateral nail margin likely from rubbing of the second toe pressing against this area.  There is mild callus at the right hallux and medial first metatarsophalangeal joint with surrounding dry skin with no signs of infection. No open lesions present bilateral. Remaining integument unremarkable.  Vasculature: Dorsalis Pedis pulse 1/4 bilateral. Posterior Tibial pulse 0/4 bilateral due to trace edema at ankles. Capillary fill time <5 sec 1-5 bilateral. Scant hair growth to the level of the digits.Temperature gradient within normal limits. Mild varicosities present bilateral.  Neurology: The patient has slightly diminished sensation measured with a 5.07/10g Semmes Weinstein Monofilament at all pedal sites bilateral . Vibratory sensation diminished bilateral with tuning fork. No Babinski sign present bilateral.   Musculoskeletal: Asymptomatic mild hammertoes pedal deformities noted bilateral. Muscular strength 5/5 in all lower extremity muscular groups bilateral without pain on range of motion . No tenderness with calf compression bilateral.  Assessment and Plan: Problem List Items Addressed This Visit      Cardiovascular and Mediastinum   PAD (peripheral artery disease) (HCC)    Other Visit Diagnoses    Pain due to  onychomycosis of toenails of both feet    -  Primary   Porokeratosis       Type 2 diabetes, controlled, with neuropathy (HCC)         -Examined patient. -Re-Discussed  and educated patient on diabetic foot care, especially with  regards to the vascular, neurological and musculoskeletal systems.  -Mechanically debrided all nails bilateral using sterile nail nipper and filed with dremel without incident & smoothed callus2 on right foot using rotary bur without incident.   -Dispensed toe cap for right second toe to prevent rubbing against the hallux advised patient if redness worsens to resume using her Neosporin and to try soaking and to call office -Continue with diabetic shoes however patient requests new shoes for this year office will check benefits and to schedule accordingly if she wants them -Patient to return  in 3 months for at risk foot care -Patient advised to call the office if any problems or questions arise in the meantime.  Asencion Islam, DPM

## 2019-08-27 ENCOUNTER — Other Ambulatory Visit: Payer: Self-pay

## 2019-08-29 ENCOUNTER — Other Ambulatory Visit: Payer: Self-pay | Admitting: *Deleted

## 2019-08-29 MED ORDER — FUROSEMIDE 80 MG PO TABS: ORAL_TABLET | ORAL | 3 refills | Status: DC

## 2019-08-29 MED ORDER — FUROSEMIDE 80 MG PO TABS: 80.0000 mg | ORAL_TABLET | Freq: Two times a day (BID) | ORAL | 3 refills | Status: DC

## 2019-08-29 NOTE — Addendum Note (Signed)
Addended by: Theola Sequin on: 08/29/2019 03:24 PM   Modules accepted: Orders

## 2019-08-29 NOTE — Telephone Encounter (Signed)
Received fax request for refill on Furosemide 80 mg. Rx sent to pharmacy for #360 and 3 Refills.

## 2019-08-29 NOTE — Telephone Encounter (Signed)
Previous Rx cancelled and new prescription sent with new directions per pharmacy. Patient is taking 1 tablet twice a day. New prescription #180 with 3 refills

## 2019-09-03 ENCOUNTER — Other Ambulatory Visit: Payer: Self-pay | Admitting: Cardiology

## 2019-09-22 ENCOUNTER — Ambulatory Visit (INDEPENDENT_AMBULATORY_CARE_PROVIDER_SITE_OTHER): Payer: Medicare PPO | Admitting: *Deleted

## 2019-09-22 DIAGNOSIS — I5042 Chronic combined systolic (congestive) and diastolic (congestive) heart failure: Secondary | ICD-10-CM | POA: Diagnosis not present

## 2019-09-23 LAB — CUP PACEART REMOTE DEVICE CHECK
Battery Remaining Longevity: 56 mo
Battery Remaining Percentage: 93 %
Battery Voltage: 2.96 V
Brady Statistic AP VP Percent: 1 %
Brady Statistic AP VS Percent: 1 %
Brady Statistic AS VP Percent: 99 %
Brady Statistic AS VS Percent: 1 %
Brady Statistic RA Percent Paced: 1 %
Date Time Interrogation Session: 20210621082400
HighPow Impedance: 62 Ohm
Implantable Lead Implant Date: 20140702
Implantable Lead Implant Date: 20140702
Implantable Lead Implant Date: 20150730
Implantable Lead Location: 753858
Implantable Lead Location: 753859
Implantable Lead Location: 753860
Implantable Lead Model: 7122
Implantable Pulse Generator Implant Date: 20210322
Lead Channel Impedance Value: 300 Ohm
Lead Channel Impedance Value: 480 Ohm
Lead Channel Impedance Value: 490 Ohm
Lead Channel Pacing Threshold Amplitude: 0.75 V
Lead Channel Pacing Threshold Amplitude: 1 V
Lead Channel Pacing Threshold Amplitude: 2.25 V
Lead Channel Pacing Threshold Pulse Width: 0.4 ms
Lead Channel Pacing Threshold Pulse Width: 0.5 ms
Lead Channel Pacing Threshold Pulse Width: 1 ms
Lead Channel Sensing Intrinsic Amplitude: 12 mV
Lead Channel Sensing Intrinsic Amplitude: 2.2 mV
Lead Channel Setting Pacing Amplitude: 2 V
Lead Channel Setting Pacing Amplitude: 2.5 V
Lead Channel Setting Pacing Amplitude: 3.25 V
Lead Channel Setting Pacing Pulse Width: 0.4 ms
Lead Channel Setting Pacing Pulse Width: 1 ms
Lead Channel Setting Sensing Sensitivity: 0.5 mV
Pulse Gen Serial Number: 810001491

## 2019-09-24 NOTE — Progress Notes (Signed)
Remote ICD transmission.   

## 2019-10-07 ENCOUNTER — Other Ambulatory Visit: Payer: Medicare PPO | Admitting: Orthotics

## 2019-10-07 ENCOUNTER — Other Ambulatory Visit: Payer: Self-pay

## 2019-10-30 ENCOUNTER — Other Ambulatory Visit: Payer: Self-pay | Admitting: Cardiology

## 2019-10-30 ENCOUNTER — Ambulatory Visit: Payer: Medicare PPO | Admitting: Sports Medicine

## 2019-11-17 ENCOUNTER — Ambulatory Visit (INDEPENDENT_AMBULATORY_CARE_PROVIDER_SITE_OTHER): Payer: Medicare PPO | Admitting: Cardiology

## 2019-11-17 ENCOUNTER — Encounter: Payer: Self-pay | Admitting: Cardiology

## 2019-11-17 ENCOUNTER — Other Ambulatory Visit: Payer: Self-pay

## 2019-11-17 VITALS — BP 118/68 | HR 71 | Ht 62.0 in | Wt 121.2 lb

## 2019-11-17 DIAGNOSIS — I428 Other cardiomyopathies: Secondary | ICD-10-CM

## 2019-11-17 DIAGNOSIS — Z95810 Presence of automatic (implantable) cardiac defibrillator: Secondary | ICD-10-CM | POA: Diagnosis not present

## 2019-11-17 DIAGNOSIS — I5042 Chronic combined systolic (congestive) and diastolic (congestive) heart failure: Secondary | ICD-10-CM | POA: Diagnosis not present

## 2019-11-17 LAB — CUP PACEART INCLINIC DEVICE CHECK
Battery Remaining Longevity: 50 mo
Brady Statistic RA Percent Paced: 0.07 %
Brady Statistic RV Percent Paced: 99.99 %
Date Time Interrogation Session: 20210816143035
HighPow Impedance: 62 Ohm
Implantable Lead Implant Date: 20140702
Implantable Lead Implant Date: 20140702
Implantable Lead Implant Date: 20150730
Implantable Lead Location: 753858
Implantable Lead Location: 753859
Implantable Lead Location: 753860
Implantable Lead Model: 7122
Implantable Pulse Generator Implant Date: 20210322
Lead Channel Impedance Value: 337.5 Ohm
Lead Channel Impedance Value: 450 Ohm
Lead Channel Impedance Value: 500 Ohm
Lead Channel Pacing Threshold Amplitude: 0.75 V
Lead Channel Pacing Threshold Amplitude: 1 V
Lead Channel Pacing Threshold Amplitude: 2.75 V
Lead Channel Pacing Threshold Pulse Width: 0.4 ms
Lead Channel Pacing Threshold Pulse Width: 0.5 ms
Lead Channel Pacing Threshold Pulse Width: 1.2 ms
Lead Channel Sensing Intrinsic Amplitude: 11.9 mV
Lead Channel Sensing Intrinsic Amplitude: 2.1 mV
Lead Channel Setting Pacing Amplitude: 2 V
Lead Channel Setting Pacing Amplitude: 2.5 V
Lead Channel Setting Pacing Amplitude: 3.25 V
Lead Channel Setting Pacing Pulse Width: 0.4 ms
Lead Channel Setting Pacing Pulse Width: 1.2 ms
Lead Channel Setting Sensing Sensitivity: 0.5 mV
Pulse Gen Serial Number: 810001491

## 2019-11-17 NOTE — Patient Instructions (Signed)
Medication Instructions:  Your physician recommends that you continue on your current medications as directed. Please refer to the Current Medication list given to you today.  *If you need a refill on your cardiac medications before your next appointment, please call your pharmacy*   Lab Work: None ordered If you have labs (blood work) drawn today and your tests are completely normal, you will receive your results only by: . MyChart Message (if you have MyChart) OR . A paper copy in the mail If you have any lab test that is abnormal or we need to change your treatment, we will call you to review the results.   Testing/Procedures: None ordered   Follow-Up: Remote monitoring is used to monitor your Pacemaker of ICD from home. This monitoring reduces the number of office visits required to check your device to one time per year. It allows us to keep an eye on the functioning of your device to ensure it is working properly. You are scheduled for a device check from home on 12/22/2019. You may send your transmission at any time that day. If you have a wireless device, the transmission will be sent automatically. After your physician reviews your transmission, you will receive a postcard with your next transmission date.  At [facility], you and your health needs are our priority.  As part of our continuing mission to provide you with exceptional heart care, we have created designated Provider Care Teams.  These Care Teams include your primary Cardiologist (physician) and Advanced Practice Providers (APPs -  Physician Assistants and Nurse Practitioners) who all work together to provide you with the care you need, when you need it.  We recommend signing up for the patient portal called "MyChart".  Sign up information is provided on this After Visit Summary.  MyChart is used to connect with patients for Virtual Visits (Telemedicine).  Patients are able to view lab/test results, encounter notes,  upcoming appointments, etc.  Non-urgent messages can be sent to your provider as well.   To learn more about what you can do with MyChart, go to [email or web address].    Your next appointment:   1 year(s)  The format for your next appointment:   In Person  Provider:   Will Camnitz, MD   Thank you for choosing [facility]!!   Emmalin Jaquess, RN [phone]    Other Instructions    

## 2019-11-17 NOTE — Progress Notes (Signed)
Electrophysiology Office Note   Date:  11/17/2019   ID:  Shari, [name] July 12, 1934, MRN [id number]  PCP:  Olive Bass, MD  Cardiologist:  Dulce Sellar Primary Electrophysiologist:  Wacey Zieger Shari Loa, MD    No chief complaint on file.    History of Present Illness: Shari [name] is a 85 y.o. female who is being seen today for the evaluation of CHF at the request of Shari [name], Shari Cheadle, MD. Presenting today for electrophysiology evaluation. She has a history of hypertensive heart disease, mild coronary artery disease, chronic combined systolic and diastolic heart failure, hyperlipidemia.  She is status post Saint Jude CRT-D with a generator change on 06/23/2019.  Today, denies symptoms of palpitations, chest pain, shortness of breath, orthopnea, PND, lower extremity edema, claudication, dizziness, presyncope, syncope, bleeding, or neurologic sequela. The patient is tolerating medications without difficulties.  Since her generator change she has done well.  She has no chest pain or shortness of breath.  Is able do all of her daily activities.   Past Medical History:  Diagnosis Date  . Anxiety   . Automatic implantable cardioverter-defibrillator in situ 10/25/2014   Overview:  Overview:  dual chamber ICD,upgraded to CRT with LBBB and nonischemic cardiomyopathy  . Biventricular ICD (implantable cardioverter-defibrillator) in place 10/25/2014   Overview:  BIV -ICD,upgraded to CRT with LBBB and nonischemic cardiomyopathy  . Chronic combined systolic and diastolic heart failure (HCC) 10/25/2014   Overview:  EF 77% by MUGA 06/15/15  Formatting of this note might be different from the original. Overview:  EF 66% by MUGA 03/12/14  Overview:  Overview:  EF 77% by MUGA 06/15/15  Overview:  EF 66% by MUGA 03/12/14  Overview:  Overview:  EF 77% by MUGA 06/15/15 Formatting of this note might be different from the original. EF 66% by MUGA 03/12/14  . Chronic coronary artery disease 10/25/2014  . Chronic  kidney disease, stage 3 12/05/2016  . Combined systolic and diastolic congestive heart failure (HCC) 10/25/2014   Last Assessment & Plan:  Formatting of this note might be different from the original. Type and degree unknown though patient currently appears to be euvolemic.  Marland Kitchen Coronary artery disease 10/25/2014  . Costochondritis 10/25/2014  . Diabetes mellitus 2 years  . Diverticulitis   . Dizziness 06/30/2018   Last Assessment & Plan:  Formatting of this note might be different from the original. 85 yo F with history of CHF, DM, PVD, h/o TIA with double vision in 2013, HTN, HLD.   Yesterday morning she reports that when she woke up and looked over at her clock it looked blurry.  She denies double vision, facial droop or extremity weakness.  When she tried to get up to ambulate she reports that she felt d  . Dysuria 06/15/2017  . Elevated troponin 06/04/2016   Last Assessment & Plan:  Likely may be related to ventricular pacing and underlying cardiomyopathy.  The trajectory of the biomarker elevation and neither is her clinical history suggestive of acute coronary syndrome.  Further stratification with nuclear stress testing  . Essential hypertension 06/04/2016   Last Assessment & Plan:  Formatting of this note might be different from the original. Continue carvedilol  . Gastroesophageal reflux disease 11/10/2010   Last Assessment & Plan:  Formatting of this note might be different from the original. Continue Dexilant.  Marland Kitchen GERD (gastroesophageal reflux disease)   . Glaucoma   . Gout 12/02/2016  . High cholesterol   . History of ischemic  stroke 06/30/2018  . Hypertension   . Hypertensive heart and kidney disease with heart failure and with chronic kidney disease stage III (HCC) 11/10/2010  . Hypertensive heart disease with heart failure (HCC) 10/25/2014  . Hypokalemia 06/04/2016   Last Assessment & Plan:  Replace with potassium chloride.  . IBS (irritable bowel syndrome)   . ICD (implantable  cardioverter-defibrillator) in place 10/25/2014   Overview:  Overview:  dual chamber ICD,upgraded to CRT with LBBB and nonischemic cardiomyopathy  Formatting of this note might be different from the original. Overview:  dual chamber ICD,upgraded to CRT with LBBB and nonischemic cardiomyopathy  Overview:  Overview:  BIV -ICD,upgraded to CRT with LBBB and nonischemic cardiomyopathy  Arturo Morton, CMA - 09/07/2016 3:00 PM EDT REMOTE MONITORING ASSE  . LBBB (left bundle branch block) 12/02/2016  . 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 Migraine 12/02/2016  . Mild CAD 10/25/2014  . Mixed hyperlipidemia 11/10/2010   Last Assessment & Plan:  Formatting of this note might be different from the original. Continue statin therapy.  . Nephrolithiasis 12/02/2016  . Onychomycosis 11/10/2010  . Osteoarthritis   . PAD (peripheral artery disease) (HCC) 11/03/2014   Overview:  status post insertion of aortic stent and left common iliac stent by Dr. Imogene Burn in August of 2016  . Peripheral arterial occlusive disease (HCC) 11/03/2014  . Precordial chest pain 06/04/2016   Last Assessment & Plan:  My suspicion is that this is likely related to gastroesophageal reflux disease with acute indigestion last evening.  The biomarker elevation is not completely unexpected in this elderly patient with reported cardiomyopathy and current demand ventricular pacing.  Nonetheless she does have a history of extensive vascular disease (though apparently no significant coronary atherosclerosis in 2013) so we Daoud Lobue proceed with a noninvasive risk stratification with pharmacological nuclear stress testing.  Assuming this is a low risk or normal study then I would not advise any invasive cardiac evaluation at this time.  . Preoperative cardiovascular examination 04/02/2015  . Rectal bleeding 11/19/2017   Formatting of this note might be different from the original. 2019  . Renal artery stenosis (HCC) 12/02/2016  . Rheumatoid  arthritis involving multiple sites (HCC) 05/21/2015  . Screening for breast cancer 04/02/2015  . Stroke (HCC)   . Type 2 diabetes mellitus (HCC) 11/10/2010   Last Assessment & Plan:  Formatting of this note might be different from the original. Continue glipizide and add sliding scale insulin coverage Formatting of this note might be different from the original. Per 01/28/2019 Podiatry VN.  Marland Kitchen Vitamin D deficiency 06/20/2016   Past Surgical History:  Procedure Laterality Date  . BIV ICD GENERATOR CHANGEOUT N/A 06/23/2019   Procedure: BIV ICD GENERATOR CHANGEOUT;  Surgeon: Regan Lemming, MD;  Location[facility] INVASIVE CV LAB;  Service: Cardiovascular;  Laterality: N/A;  . FOOT SURGERY    . pacemaker surgery     a-fib and was done july 2014  . PERIPHERAL VASCULAR CATHETERIZATION N/A 11/05/2014   Procedure: Abdominal Aortogram;  Surgeon: Fransisco Hertz, MD;  Location: [facility] INVASIVE CV LAB;  Service: Cardiovascular;  Laterality: N/A;     Current Outpatient Medications  Medication Sig Dispense Refill  . acetaminophen (TYLENOL) 650 MG CR tablet Take 1,300 mg by mouth in the morning and at bedtime.    . ALPRAZolam (XANAX) 0.25 MG tablet Take 1 tablet (0.25 mg total) by mouth daily as needed for anxiety. 30 tablet 0  . Ascorbic Acid (VITAMIN C) 1000 MG  tablet Take 1,000 mg by mouth daily.    Marland Kitchen atorvastatin (LIPITOR) 40 MG tablet Take 40 mg by mouth at bedtime.     . Calcium Carbonate-Vit D-Min (CALCIUM 1200 PO) Take 1 tablet by mouth daily.     . clopidogrel (PLAVIX) 75 MG tablet TAKE ONE TABLET BY MOUTH EVERY DAY 90 tablet 2  . DEXILANT 60 MG capsule Take 60 mg by mouth daily.     . furosemide (LASIX) 80 MG tablet Take 1 tablet (80 mg total) by mouth 2 (two) times daily. Take 2 tablets (160 mg total) by mouth 2 (two) times daily. 180 tablet 3  . glipiZIDE (GLUCOTROL) 10 MG tablet Take 10 mg by mouth 2 (two) times daily before a meal.     . isosorbide mononitrate (IMDUR) 60 MG 24 hr tablet Take 1 tablet  (60 mg total) by mouth daily. 90 tablet 2  . JANUVIA 25 MG tablet Take 25 mg by mouth daily.    Marland Kitchen ketoconazole (NIZORAL) 2 % shampoo Apply 1 application topically 2 (two) times a week.    . meclizine (ANTIVERT) 12.5 MG tablet Take 1 tablet daily as needed for dizziness. 30 tablet 1  . metoprolol succinate (TOPROL-XL) 25 MG 24 hr tablet Take 1 tablet (25 mg total) by mouth daily. 90 tablet 2  . montelukast (SINGULAIR) 10 MG tablet Take 10 mg by mouth at bedtime.    . nitroGLYCERIN (NITROSTAT) 0.4 MG SL tablet Place 1 tablet (0.4 mg total) under the tongue every 5 (five) minutes as needed. 25 tablet 6  . potassium chloride (KLOR-CON) 10 MEQ tablet Take 1 tablet (10 mEq total) by mouth daily. 90 tablet 3  . Propylene Glycol-Glycerin (MOISTURE EYES OP) Place 1 drop into both eyes daily as needed (Moisture eyes). thera tears    . ramipril (ALTACE) 10 MG capsule Take 10 mg by mouth daily.      . ranolazine (RANEXA) 500 MG 12 hr tablet Take 1 tablet (500 mg total) by mouth daily. 90 tablet 2  . sodium chloride (MURO 128) 5 % ophthalmic solution Place 1 drop into both eyes as needed for eye irritation.    . terbinafine (LAMISIL) 250 MG tablet Take 250 mg by mouth daily as needed (Dermatitis on scalp).     . VASCEPA 1 g CAPS Take 2 g by mouth 2 (two) times daily.   3  . Vitamin D, Ergocalciferol, (DRISDOL) 50000 units CAPS capsule Take 50,000 Units by mouth every 14 (fourteen) days.      No current facility-administered medications for this visit.    Allergies:   Codeine, Dulaglutide, Ivp dye [iodinated diagnostic agents], Metformin, Metrizamide, and Pioglitazone   Social History:  The patient  reports that she has never smoked. She has never used smokeless tobacco. She reports that she does not drink alcohol and does not use drugs.   Family History:  The patient's family history includes Alzheimer's disease in her mother; Arthritis in her mother and sister; Bladder Cancer in her father; Heart attack  in her maternal grandfather and maternal grandmother; Hyperlipidemia in her father and mother; Vitiligo in her sister.    ROS:  Please see the history of present illness.   Otherwise, review of systems is positive for none.   All other systems are reviewed and negative.   PHYSICAL EXAM: VS:  BP 118/68   Pulse 71   Ht 5\' 2"  (1.575 m)   Wt 121 lb 3.2 oz (55 kg)   SpO2 95%  BMI 22.17 kg/m  , BMI Body mass index is 22.17 kg/m. GEN: Well nourished, well developed, in no acute distress  HEENT: normal  Neck: no JVD, carotid bruits, or masses Cardiac: RRR; no murmurs, rubs, or gallops,no edema  Respiratory:  clear to auscultation bilaterally, normal work of breathing GI: soft, nontender, nondistended, + BS MS: no deformity or atrophy  Skin: warm and dry, device site well healed Neuro:  Strength and sensation are intact Psych: euthymic mood, full affect  EKG:  EKG is ordered today. Personal review of the ekg ordered shows atrial sensed, ventricular paced, rate 71  Personal review of the device interrogation today. Results in [facility]   Recent Labs: 06/23/2019: BUN 18; Creatinine, Ser 1.17; Hemoglobin 11.6; Platelets 152; Potassium 3.4; Sodium 140    Lipid Panel  No results found for: CHOL, TRIG, HDL, CHOLHDL, VLDL, LDLCALC, LDLDIRECT   Wt Readings from Last 3 Encounters:  11/17/19 121 lb 3.2 oz (55 kg)  08/08/19 121 lb (54.9 kg)  07/07/19 119 lb (54 kg)      Other studies Reviewed: Additional studies/ records that were reviewed today include: TTE 05/22/2017 Review of the above records today demonstrates:  - Left ventricle: The cavity size was normal. Systolic function was   normal. The estimated ejection fraction was in the range of 60%   to 65%. Wall motion was normal; there were no regional wall   motion abnormalities. - Aortic valve: There was mild regurgitation. Valve area (VTI):   1.83 cm^2. Valve area (Vmax): 1.72 cm^2. Valve area (Vmean): 1.71   cm^2.   ASSESSMENT  AND PLAN:  1.  Chronic systolic and diastolic heart failure: Patient is status post Saint Jude CRT-D.  Recent generator change.  Interrogation without issues.  Incision site is without issue as well.  No changes.    2.  Hypertension:well controlled  3. Hyperlipidemia: Continue atorvastatin  4. Coronary artery disease: Currently on Plavix.  No current chest pain.  Continue current management.   Current medicines are reviewed at length with the patient today.   The patient does not have concerns regarding her medicines.  The following changes were made today: none  Labs/ tests ordered today include:  Orders Placed This Encounter  Procedures  . EKG 12-Lead     Disposition:   FU with Kahlie Deutscher 12 months  Signed, Carlin Mamone Shari Loa, MD  11/17/2019 2:33 PM     [facility] [address] [place] [address] [phone] (office) [phone] (fax)

## 2019-11-21 ENCOUNTER — Other Ambulatory Visit: Payer: Self-pay

## 2019-11-21 ENCOUNTER — Ambulatory Visit: Payer: Medicare PPO | Admitting: Sports Medicine

## 2019-11-21 ENCOUNTER — Encounter: Payer: Self-pay | Admitting: Sports Medicine

## 2019-11-21 DIAGNOSIS — E1140 Type 2 diabetes mellitus with diabetic neuropathy, unspecified: Secondary | ICD-10-CM

## 2019-11-21 DIAGNOSIS — M79675 Pain in left toe(s): Secondary | ICD-10-CM

## 2019-11-21 DIAGNOSIS — B351 Tinea unguium: Secondary | ICD-10-CM | POA: Diagnosis not present

## 2019-11-21 DIAGNOSIS — I739 Peripheral vascular disease, unspecified: Secondary | ICD-10-CM

## 2019-11-21 DIAGNOSIS — M79674 Pain in right toe(s): Secondary | ICD-10-CM

## 2019-11-21 DIAGNOSIS — Q828 Other specified congenital malformations of skin: Secondary | ICD-10-CM

## 2019-11-21 NOTE — Progress Notes (Signed)
Subjective: Shari [name] is a 85 y.o. female patient with history of diabetes who returns to office today complaining of callus skin and long, painful nails  while ambulating in shoes; unable to trim.  Patient reports that she has had some occassional sharp shooting pains to the toes R>L that comes and goes. Patient states that the glucose reading this morning was 187 and last A1c was 7.8. Patient admits new change in medication dermatologist added on Acyclovir.  Saw PCP, Dr. Sol Passer x 6 weeks ago   Patient Active Problem List   Diagnosis Date Noted  . Stroke (HCC) 08/07/2019  . Dizziness 06/30/2018  . History of ischemic stroke 06/30/2018  . Rectal bleeding 11/19/2017  . Dysuria 06/15/2017  . Chronic kidney disease, stage 3 12/05/2016  . Gout 12/02/2016  . Migraine 12/02/2016  . Nephrolithiasis 12/02/2016  . LBBB (left bundle branch block) 12/02/2016  . Renal artery stenosis (HCC) 12/02/2016  . Vitamin D deficiency 06/20/2016  . Elevated troponin 06/04/2016  . Hypokalemia 06/04/2016  . Precordial chest pain 06/04/2016  . Essential hypertension 06/04/2016  . Rheumatoid arthritis involving multiple sites (HCC) 05/21/2015  . Screening for breast cancer 04/02/2015  . PAD (peripheral artery disease) (HCC) 11/03/2014  . Peripheral arterial occlusive disease (HCC) 11/03/2014  . ICD (implantable cardioverter-defibrillator) in place 10/25/2014  . 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 Chronic combined systolic and diastolic heart failure (HCC) 10/25/2014  . Costochondritis 10/25/2014  . Combined systolic and diastolic congestive heart failure (HCC) 10/25/2014  . Coronary artery disease 10/25/2014  . Mixed hyperlipidemia 11/10/2010  . Hypertensive heart and kidney disease with heart failure and with chronic kidney disease stage III (HCC) 11/10/2010  . ibs 11/10/2010  . Osteoarthritis 11/10/2010  . Diverticulitis 11/10/2010  . Gastroesophageal  reflux disease 11/10/2010  . Glaucoma 11/10/2010  . Type 2 diabetes mellitus (HCC) 11/10/2010  . Onychomycosis 11/10/2010   Current Outpatient Medications on File Prior to Visit  Medication Sig Dispense Refill  . acetaminophen (TYLENOL) 650 MG CR tablet Take 1,300 mg by mouth in the morning and at bedtime.    . ALPRAZolam (XANAX) 0.25 MG tablet Take 1 tablet (0.25 mg total) by mouth daily as needed for anxiety. 30 tablet 0  . Ascorbic Acid (VITAMIN C) 1000 MG tablet Take 1,000 mg by mouth daily.    Marland Kitchen atorvastatin (LIPITOR) 40 MG tablet Take 40 mg by mouth at bedtime.     . Calcium Carbonate-Vit D-Min (CALCIUM 1200 PO) Take 1 tablet by mouth daily.     . clopidogrel (PLAVIX) 75 MG tablet TAKE ONE TABLET BY MOUTH EVERY DAY 90 tablet 2  . DEXILANT 60 MG capsule Take 60 mg by mouth daily.     . furosemide (LASIX) 80 MG tablet Take 1 tablet (80 mg total) by mouth 2 (two) times daily. Take 2 tablets (160 mg total) by mouth 2 (two) times daily. 180 tablet 3  . glipiZIDE (GLUCOTROL) 10 MG tablet Take 10 mg by mouth 2 (two) times daily before a meal.     . isosorbide mononitrate (IMDUR) 60 MG 24 hr tablet Take 1 tablet (60 mg total) by mouth daily. 90 tablet 2  . JANUVIA 25 MG tablet Take 25 mg by mouth daily.    Marland Kitchen ketoconazole (NIZORAL) 2 % shampoo Apply 1 application topically 2 (two) times a week.    . meclizine (ANTIVERT) 12.5 MG tablet Take 1 tablet daily as needed for dizziness. 30 tablet 1  . metoprolol succinate (  TOPROL-XL) 25 MG 24 hr tablet Take 1 tablet (25 mg total) by mouth daily. 90 tablet 2  . montelukast (SINGULAIR) 10 MG tablet Take 10 mg by mouth at bedtime.    . nitroGLYCERIN (NITROSTAT) 0.4 MG SL tablet Place 1 tablet (0.4 mg total) under the tongue every 5 (five) minutes as needed. 25 tablet 6  . potassium chloride (KLOR-CON) 10 MEQ tablet Take 1 tablet (10 mEq total) by mouth daily. 90 tablet 3  . Propylene Glycol-Glycerin (MOISTURE EYES OP) Place 1 drop into both eyes daily as  needed (Moisture eyes). thera tears    . ramipril (ALTACE) 10 MG capsule Take 10 mg by mouth daily.      . ranolazine (RANEXA) 500 MG 12 hr tablet Take 1 tablet (500 mg total) by mouth daily. 90 tablet 2  . sodium chloride (MURO 128) 5 % ophthalmic solution Place 1 drop into both eyes as needed for eye irritation.    . terbinafine (LAMISIL) 250 MG tablet Take 250 mg by mouth daily as needed (Dermatitis on scalp).     . VASCEPA 1 g CAPS Take 2 g by mouth 2 (two) times daily.   3  . Vitamin D, Ergocalciferol, (DRISDOL) 50000 units CAPS capsule Take 50,000 Units by mouth every 14 (fourteen) days.      No current facility-administered medications on file prior to visit.   Allergies  Allergen Reactions  . Codeine Diarrhea, Nausea And Vomiting, Other (See Comments) and Hives    All-over body aches, too Other reaction(s): GI intolerance  . Dulaglutide Other (See Comments)    Chest pain, N/V  . Ivp Dye [Iodinated Diagnostic Agents] Nausea And Vomiting and Other (See Comments)  . Metformin Other (See Comments)  . Metrizamide Nausea And Vomiting and Other (See Comments)  . Pioglitazone Other (See Comments)    Has CHF    Recent Results (from the past 2160 hour(s))  CUP PACEART REMOTE DEVICE CHECK     Status: None   Collection Time: 09/22/19  8:24 AM  Result Value Ref Range   Date Time Interrogation Session [id number]    Pulse Generator Manufacturer OTHER    Pulse Gen Model [id number] Gallant HF    Pulse Gen Serial Number [id number]    Clinic Name [facility]    Implantable Pulse Generator Type Cardiac Resynch Therapy Defibulator    Implantable Pulse Generator Implant Date 93810175    Implantable Lead Manufacturer [facility]    Implantable Lead Model 1458Q Quartet    Implantable Lead Serial Number [id number]    Implantable Lead Implant Date 10258527    Implantable Lead Location Detail 1 UNKNOWN    Implantable Lead Location [id number]    Implantable Lead Manufacturer [facility]    Implantable Lead  Model [id number] Tendril STS    Implantable Lead Serial Number [id number]    Implantable Lead Implant Date 78242353    Implantable Lead Location Detail 1 UNKNOWN    Implantable Lead Location [id number]    Implantable Lead Manufacturer [facility]    Implantable Lead Model C1931474 Durata    Implantable Lead Serial Number [id number]    Implantable Lead Implant Date 61443154    Implantable Lead Location Detail 1 UNKNOWN    Implantable Lead Location [id number]    Lead Channel Setting Sensing Sensitivity 0.5 mV   Lead Channel Setting Sensing Adaptation Mode Adaptive Sensing    Lead Channel Setting Pacing Amplitude 2.0 V   Lead Channel Setting Pacing Pulse Width 0.4 ms   Lead Channel Setting Pacing  Amplitude 2.5 V   Lead Channel Setting Pacing Pulse Width 1.0 ms   Lead Channel Setting Pacing Amplitude 3.25 V   Lead Channel Setting Pacing Capture Mode Fixed Pacing    Lead Channel Status NULL    Lead Channel Impedance Value 490 ohm   Lead Channel Pacing Threshold Amplitude 2.25 V   Lead Channel Pacing Threshold Pulse Width 1.0 ms   Lead Channel Status NULL    Lead Channel Impedance Value 480 ohm   Lead Channel Sensing Intrinsic Amplitude 2.2 mV   Lead Channel Pacing Threshold Amplitude 0.75 V   Lead Channel Pacing Threshold Pulse Width 0.5 ms   Lead Channel Status NULL    Lead Channel Impedance Value 300 ohm   Lead Channel Sensing Intrinsic Amplitude 12.0 mV   Lead Channel Pacing Threshold Amplitude 1.0 V   Lead Channel Pacing Threshold Pulse Width 0.4 ms   HighPow Impedance 62 ohm   HighPow Imped Status NULL    Battery Status MOS    Battery Remaining Longevity 56 mo   Battery Remaining Percentage 93.0 %   Battery Voltage 2.96 V   Brady Statistic RA Percent Paced 1.0 %   Brady Statistic AP VP Percent 1.0 %   Brady Statistic AS VP Percent 99.0 %   Brady Statistic AP VS Percent 1.0 %   Brady Statistic AS VS Percent 1.0 %  CUP PACEART INCLINIC DEVICE CHECK     Status: None   Collection Time: 11/17/19  2:30  PM  Result Value Ref Range   Date Time Interrogation Session [id number]    Pulse Generator Manufacturer OTHER    Pulse Gen Model CDHFA500Q Gallant HF    Pulse Gen Serial Number [id number]    Clinic Name [facility]    Implantable Pulse Generator Type Cardiac Resynch Therapy Defibulator    Implantable Pulse Generator Implant Date [id number]    Implantable Lead Manufacturer [facility]    Implantable Lead Model 1458Q Quartet    Implantable Lead Serial Number [id number]    Implantable Lead Implant Date 71245809    Implantable Lead Location Detail 1 UNKNOWN    Implantable Lead Location [id number]    Implantable Lead Manufacturer [facility]    Implantable Lead Model [id number] Tendril STS    Implantable Lead Serial Number [id number]    Implantable Lead Implant Date 50539767    Implantable Lead Location Detail 1 UNKNOWN    Implantable Lead Location [id number]    Implantable Lead Manufacturer [facility]    Implantable Lead Model 7122 Durata    Implantable Lead Serial Number [id number]    Implantable Lead Implant Date 34193790    Implantable Lead Location Detail 1 UNKNOWN    Implantable Lead Location [id number]    Lead Channel Setting Sensing Sensitivity 0.5 mV   Lead Channel Setting Pacing Amplitude 2.0 V   Lead Channel Setting Pacing Pulse Width 0.4 ms   Lead Channel Setting Pacing Amplitude 2.5 V   Lead Channel Setting Pacing Pulse Width 1.2 ms   Lead Channel Setting Pacing Amplitude 3.25 V   Lead Channel Setting Pacing Capture Mode Fixed Pacing    Lead Channel Impedance Value 450.0 ohm   Lead Channel Sensing Intrinsic Amplitude 2.1 mV   Lead Channel Pacing Threshold Amplitude 0.75 V   Lead Channel Pacing Threshold Pulse Width 0.5 ms   Lead Channel Impedance Value 337.5 ohm   Lead Channel Sensing Intrinsic Amplitude 11.9 mV   Lead Channel Pacing Threshold Amplitude 1.0 V   Lead Channel Pacing Threshold Pulse  Width 0.4 ms   HighPow Impedance 62 ohm   Lead Channel Impedance Value 500.0 ohm   Lead Channel  Pacing Threshold Amplitude 2.75 V   Lead Channel Pacing Threshold Pulse Width 1.2 ms   Battery Remaining Longevity 50 mo   Brady Statistic RA Percent Paced 0.07 %   Brady Statistic RV Percent Paced 99.99 %   Eval Rhythm AS/VS 75     Objective: General: Patient is awake, alert, and oriented x 3 and in no acute distress.  Integument: Skin is warm, dry and supple bilateral. Nails are tender, long, thickened and dystrophic with subungual debris, consistent with onychomycosis, 2-5 bilateral. There are no nails on bilateral hallux, or very limited nail due to previous nail procedures. No open lesions present bilateral. Remaining integument unremarkable.  Vasculature: Dorsalis Pedis pulse 1/4 bilateral. Posterior Tibial pulse 0/4 bilateral due to trace edema at ankles. Capillary fill time <5 sec 1-5 bilateral. Scant hair growth to the level of the digits.Temperature gradient within normal limits. Mild varicosities present bilateral.  Neurology: The patient has slightly diminished sensation measured with a 5.07/10g Semmes Weinstein Monofilament at all pedal sites bilateral . Subjective sharp pains to toes ocassional.  Musculoskeletal: Asymptomatic mild hammertoes pedal deformities noted bilateral. Muscular strength 5/5 in all lower extremity muscular groups bilateral without pain on range of motion . No tenderness with calf compression bilateral.  Assessment and Plan: Problem List Items Addressed This Visit      Cardiovascular and Mediastinum   PAD (peripheral artery disease) (HCC)    Other Visit Diagnoses    Pain due to onychomycosis of toenails of both feet    -  Primary   Porokeratosis       Type 2 diabetes, controlled, with neuropathy (HCC)         -Examined patient. -Re-Discussed and educated patient daily foot inspection in the setting of diabetes  -Mechanically debrided all nails bilateral using sterile nail nipper and filed with dremel without incident & smoothed callus2 on right  foot using rotary bur without incident.   -Advised patient that we will closely monitor sharp pains which could be closely related to Diabetic neuropathy -Return in 3 months for nail care -Patient advised to call the office if any problems or questions arise in the meantime.  Asencion Islam, DPM

## 2019-11-27 ENCOUNTER — Telehealth: Payer: Self-pay | Admitting: Sports Medicine

## 2019-11-27 ENCOUNTER — Other Ambulatory Visit: Payer: Self-pay | Admitting: Cardiology

## 2019-11-27 NOTE — Telephone Encounter (Signed)
Pt called checking status of diabetic shoes.  I told pt I would call her back because they show they went to [place] office and I am in [place]. I just need to confirm with that office that they have them in.

## 2019-12-03 DIAGNOSIS — K219 Gastro-esophageal reflux disease without esophagitis: Secondary | ICD-10-CM | POA: Insufficient documentation

## 2019-12-03 DIAGNOSIS — F419 Anxiety disorder, unspecified: Secondary | ICD-10-CM | POA: Insufficient documentation

## 2019-12-03 DIAGNOSIS — E7800 Pure hypercholesterolemia, unspecified: Secondary | ICD-10-CM | POA: Insufficient documentation

## 2019-12-03 DIAGNOSIS — I10 Essential (primary) hypertension: Secondary | ICD-10-CM | POA: Insufficient documentation

## 2019-12-03 NOTE — Progress Notes (Signed)
Cardiology Office Note:    Date:  12/04/2019   ID:  [name], DOB 10-Jan-1935, MRN [id number]  PCP:  Shari Bass, MD  Cardiologist:  Shari Herrlich, MD    Referring MD: Shari Bass, MD    ASSESSMENT:    1. Nonischemic cardiomyopathy (HCC)   2. Biventricular implantable cardioverter-defibrillator in situ   3. Chronic combined systolic and diastolic heart failure (HCC)   4. Hypertensive heart and kidney disease with heart failure and with chronic kidney disease stage III (HCC)   5. Dyslipidemia   6. Stage 3 chronic kidney disease, unspecified whether stage 3a or 3b CKD    PLAN:    In order of problems listed above:  1. Stable her ejection fraction is normalized with guideline directed therapy and CRT, we discussed D prescription which I think might help with her fatigue she will reduce her diuretic to once a day unless her weight is up beyond usual greater 123 pounds and she has had no angina.  Ranolazine.  She will continue her other guideline directed therapy. 2. Stable appropriate function she is paced virtually 100% of the time and this is a subset to derive benefit 3. Heart failure is compensated will reduce her diuretic dose continue other medications including beta-blocker ACE inhibitor 4. Stable continue combined therapy including statin and icosapent ethyl 5. Stable recent labs 6. Type 2 diabetes is improved A1c dropped 0.5 her cardiovascular perspective she is in a high risk group and her goal usually is A1c less than 7.5   Next appointment: 6 months   Medication Adjustments/Labs and Tests Ordered: Current medicines are reviewed at length with the patient today.  Concerns regarding medicines are outlined above.  No orders of the defined types were placed in this encounter.  No orders of the defined types were placed in this encounter.   Chief Complaint  Patient presents with  . Follow-up  . Congestive Heart Failure  . Cardiomyopathy    History of  Present Illness:    [name] is a 85 y.o. female with a hx of LBBB, CHF EF 77% 06/04/16 s/p CRT-D, DLD, HTN, CKD3, PAD with distal aortic and left common iliac artery stent 2016, mild coronary atherosclerosis, costal chondritis, DM2  last seen 03/30/2019.  Generator change performed 06/23/2019.  She follows with nephrology Shari [name] for stage III CKD last seen 08/08/2019. Compliance with diet, lifestyle and medications: Yes\  Overall has done well she just finds it after he tries to go outside work in the garden she is fatigued and it takes her a few days to recover.  She has had no angina edema shortness of breath palpitation or syncope.  She is very concerned about her husband's increasing frailty.  She has requested to have her vascular study ABI performed in my office.  Recent labs [facility] 11/28/2019 A1c mildly elevated 7.4 improved from previous 7.9 Potassium 3.6 creatinine 1.29 GFR 39 cc/min Lipids at target cholesterol 156, LDL 48 triglyceride 387 HDL 32 06/02/2019 hemoglobin mildly decreased 11.8  Her last device check 11/17/2019 reviewed during the visit normal function battery and lead placement.  There were 16 mode switches which appear to be due to right atrial noise not atrial fibrillation and no ventricular tachyarrhythmias.  She is appropriately biventricular paced greater than 99% of the time. Past Medical History:  Diagnosis Date  . Anxiety   . Automatic implantable cardioverter-defibrillator in situ 10/25/2014   Overview:  Overview:  dual chamber  ICD,upgraded to CRT with LBBB and nonischemic cardiomyopathy  . Biventricular ICD (implantable cardioverter-defibrillator) in place 10/25/2014   Overview:  BIV -ICD,upgraded to CRT with LBBB and nonischemic cardiomyopathy  . Chronic combined systolic and diastolic heart failure (HCC) 10/25/2014   Overview:  EF 77% by MUGA 06/15/15  Formatting of this note might be different from the original. Overview:  EF 66% by  MUGA 03/12/14  Overview:  Overview:  EF 77% by MUGA 06/15/15  Overview:  EF 66% by MUGA 03/12/14  Overview:  Overview:  EF 77% by MUGA 06/15/15 Formatting of this note might be different from the original. EF 66% by MUGA 03/12/14  . Chronic coronary artery disease 10/25/2014  . Chronic kidney disease, stage 3 12/05/2016  . Combined systolic and diastolic congestive heart failure (HCC) 10/25/2014   Last Assessment & Plan:  Formatting of this note might be different from the original. Type and degree unknown though patient currently appears to be euvolemic.  Marland Kitchen Coronary artery disease 10/25/2014  . Costochondritis 10/25/2014  . Diabetes mellitus 2 years  . Diverticulitis   . Dizziness 06/30/2018   Last Assessment & Plan:  Formatting of this note might be different from the original. 85 yo F with history of CHF, DM, PVD, h/o TIA with double vision in 2013, HTN, HLD.   Yesterday morning she reports that when she woke up and looked over at her clock it looked blurry.  She denies double vision, facial droop or extremity weakness.  When she tried to get up to ambulate she reports that she felt d  . Dysuria 06/15/2017  . Elevated troponin 06/04/2016   Last Assessment & Plan:  Likely may be related to ventricular pacing and underlying cardiomyopathy.  The trajectory of the biomarker elevation and neither is her clinical history suggestive of acute coronary syndrome.  Further stratification with nuclear stress testing  . Essential hypertension 06/04/2016   Last Assessment & Plan:  Formatting of this note might be different from the original. Continue carvedilol  . Gastroesophageal reflux disease 11/10/2010   Last Assessment & Plan:  Formatting of this note might be different from the original. Continue Dexilant.  Marland Kitchen GERD (gastroesophageal reflux disease)   . Glaucoma   . Gout 12/02/2016  . High cholesterol   . History of ischemic stroke 06/30/2018  . Hypertension   . Hypertensive heart and kidney disease with heart  failure and with chronic kidney disease stage III (HCC) 11/10/2010  . Hypertensive heart disease with heart failure (HCC) 10/25/2014  . Hypokalemia 06/04/2016   Last Assessment & Plan:  Replace with potassium chloride.  . IBS (irritable bowel syndrome)   . ICD (implantable cardioverter-defibrillator) in place 10/25/2014   Overview:  Overview:  dual chamber ICD,upgraded to CRT with LBBB and nonischemic cardiomyopathy  Formatting of this note might be different from the original. Overview:  dual chamber ICD,upgraded to CRT with LBBB and nonischemic cardiomyopathy  Overview:  Overview:  BIV -ICD,upgraded to CRT with LBBB and nonischemic cardiomyopathy  Arturo Morton, CMA - 09/07/2016 3:00 PM EDT REMOTE MONITORING ASSE  . LBBB (left bundle branch block) 12/02/2016  . 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 Migraine 12/02/2016  . Mild CAD 10/25/2014  . Mixed hyperlipidemia 11/10/2010   Last Assessment & Plan:  Formatting of this note might be different from the original. Continue statin therapy.  . Nephrolithiasis 12/02/2016  . Onychomycosis 11/10/2010  . Osteoarthritis   . PAD (peripheral artery disease) (HCC) 11/03/2014  Overview:  status post insertion of aortic stent and left common iliac stent by Dr. Imogene Burn in August of 2016  . Peripheral arterial occlusive disease (HCC) 11/03/2014  . Precordial chest pain 06/04/2016   Last Assessment & Plan:  My suspicion is that this is likely related to gastroesophageal reflux disease with acute indigestion last evening.  The biomarker elevation is not completely unexpected in this elderly patient with reported cardiomyopathy and current demand ventricular pacing.  Nonetheless she does have a history of extensive vascular disease (though apparently no significant coronary atherosclerosis in 2013) so we will proceed with a noninvasive risk stratification with pharmacological nuclear stress testing.  Assuming this is a low risk or normal study then I  would not advise any invasive cardiac evaluation at this time.  . Preoperative cardiovascular examination 04/02/2015  . Rectal bleeding 11/19/2017   Formatting of this note might be different from the original. 2019  . Renal artery stenosis (HCC) 12/02/2016  . Rheumatoid arthritis involving multiple sites (HCC) 05/21/2015  . Screening for breast cancer 04/02/2015  . Stroke (HCC)   . Type 2 diabetes mellitus (HCC) 11/10/2010   Last Assessment & Plan:  Formatting of this note might be different from the original. Continue glipizide and add sliding scale insulin coverage Formatting of this note might be different from the original. Per 01/28/2019 Podiatry VN.  Marland Kitchen Vitamin D deficiency 06/20/2016    Past Surgical History:  Procedure Laterality Date  . BIV ICD GENERATOR CHANGEOUT N/A 06/23/2019   Procedure: BIV ICD GENERATOR CHANGEOUT;  Surgeon: Regan Lemming, MD;  Location: [name];  Service: Cardiovascular;  Laterality: N/A;  . FOOT SURGERY    . pacemaker surgery     a-fib and was done july 2014  . PERIPHERAL VASCULAR CATHETERIZATION N/A 11/05/2014   Procedure: Abdominal Aortogram;  Surgeon: Fransisco Hertz, MD;  Location: [facility] INVASIVE CV LAB;  Service: Cardiovascular;  Laterality: N/A;    Current Medications: Current Meds  Medication Sig  . acetaminophen (TYLENOL) 650 MG CR tablet Take 1,300 mg by mouth in the morning and at bedtime.  Marland Kitchen acyclovir (ZOVIRAX) 400 MG tablet Take 400 mg by mouth 5 (five) times daily.  Marland Kitchen atorvastatin (LIPITOR) 40 MG tablet Take 40 mg by mouth at bedtime.   . Calcium Carbonate-Vit D-Min (CALCIUM 1200 PO) Take 1 tablet by mouth daily.   . clopidogrel (PLAVIX) 75 MG tablet TAKE ONE TABLET BY MOUTH EVERY DAY  . DEXILANT 60 MG capsule Take 60 mg by mouth daily.   . furosemide (LASIX) 80 MG tablet Take 1 tablet (80 mg total) by mouth 2 (two) times daily. Take 2 tablets (160 mg total) by mouth 2 (two) times daily.  Marland Kitchen glipiZIDE (GLUCOTROL) 10 MG tablet Take 10 mg  by mouth 2 (two) times daily before a meal.   . isosorbide mononitrate (IMDUR) 60 MG 24 hr tablet Take 1 tablet (60 mg total) by mouth daily.  Marland Kitchen JANUVIA 25 MG tablet Take 25 mg by mouth daily.  Marland Kitchen ketoconazole (NIZORAL) 2 % shampoo Apply 1 application topically 2 (two) times a week.  . metoprolol succinate (TOPROL-XL) 25 MG 24 hr tablet Take 1 tablet (25 mg total) by mouth daily.  . montelukast (SINGULAIR) 10 MG tablet Take 10 mg by mouth at bedtime.  Marland Kitchen NITROSTAT 0.4 MG SL tablet DISSOLVE 1 TABLET UNDER THE TONGUE AS NEEDED FOR CHEST PAIN.  Marland Kitchen nystatin (MYCOSTATIN) 100000 UNIT/ML suspension Take 5 mLs by mouth 4 (four) times daily.  Marland Kitchen  potassium chloride (KLOR-CON) 10 MEQ tablet Take 1 tablet (10 mEq total) by mouth daily.  . Prodigy Twist Top Lancets 28G MISC by Does not apply route.  . ramipril (ALTACE) 10 MG capsule Take 10 mg by mouth daily.    . ranolazine (RANEXA) 500 MG 12 hr tablet Take 1 tablet (500 mg total) by mouth daily.  . sodium chloride (MURO 128) 5 % ophthalmic solution 1 drop 2 (two) times daily.  Marland Kitchen terbinafine (LAMISIL) 250 MG tablet Take 250 mg by mouth daily as needed (Dermatitis on scalp).   . VASCEPA 1 g CAPS Take 2 g by mouth 2 (two) times daily.   . Vitamin D, Ergocalciferol, (DRISDOL) 50000 units CAPS capsule Take 50,000 Units by mouth every 14 (fourteen) days.      Allergies:   Codeine, Dulaglutide, Ivp dye [iodinated diagnostic agents], Metformin, Metrizamide, and Pioglitazone   Social History   Socioeconomic History  . Marital status: Married    Spouse name: Not on file  . Number of children: Not on file  . Years of education: Not on file  . Highest education level: Not on file  Occupational History  . Not on file  Tobacco Use  . Smoking status: Never Smoker  . Smokeless tobacco: Never Used  Vaping Use  . Vaping Use: Never used  Substance and Sexual Activity  . Alcohol use: No    Alcohol/week: 0.0 standard drinks  . Drug use: No  . Sexual activity: Not  on file  Other Topics Concern  . Not on file  Social History Narrative  . Not on file   Social Determinants of Health   Financial Resource Strain:   . Difficulty of Paying Living Expenses: Not on file  Food Insecurity:   . Worried About Programme researcher, broadcasting/film/video in the Last Year: Not on file  . Ran Out of Food in the Last Year: Not on file  Transportation Needs:   . Lack of Transportation (Medical): Not on file  . Lack of Transportation (Non-Medical): Not on file  Physical Activity:   . Days of Exercise per Week: Not on file  . Minutes of Exercise per Session: Not on file  Stress:   . Feeling of Stress : Not on file  Social Connections:   . Frequency of Communication with Friends and Family: Not on file  . Frequency of Social Gatherings with Friends and Family: Not on file  . Attends Religious Services: Not on file  . Active Member of Clubs or Organizations: Not on file  . Attends Banker Meetings: Not on file  . Marital Status: Not on file     Family History: The patient's family history includes Alzheimer's disease in her mother; Arthritis in her mother and sister; Bladder Cancer in her father; Heart attack in her maternal grandfather and maternal grandmother; Hyperlipidemia in her father and mother; Vitiligo in her sister. ROS:   Please see the history of present illness.    All other systems reviewed and are negative.  EKGs/Labs/Other Studies Reviewed:    The following studies were reviewed today:   Physical Exam:    VS:  Ht 5\' 2"  (1.575 m)   Wt 120 lb (54.4 kg)   BMI 21.95 kg/m     Wt Readings from Last 3 Encounters:  12/04/19 120 lb (54.4 kg)  11/17/19 121 lb 3.2 oz (55 kg)  08/08/19 121 lb (54.9 kg)     GEN:  Well nourished, well developed in no acute  distress HEENT: Normal NECK: No JVD; No carotid bruits LYMPHATICS: No lymphadenopathy CARDIAC: RRR, no murmurs, rubs, gallops RESPIRATORY:  Clear to auscultation without rales, wheezing or rhonchi   ABDOMEN: Soft, non-tender, non-distended MUSCULOSKELETAL:  No edema; No deformity  SKIN: Warm and dry NEUROLOGIC:  Alert and oriented x 3 PSYCHIATRIC:  Normal affect    Signed, Shari Herrlich, MD  12/04/2019 1:02 PM    [facility]

## 2019-12-04 ENCOUNTER — Encounter: Payer: Self-pay | Admitting: Cardiology

## 2019-12-04 ENCOUNTER — Other Ambulatory Visit: Payer: Self-pay

## 2019-12-04 ENCOUNTER — Ambulatory Visit: Payer: Medicare PPO | Admitting: Cardiology

## 2019-12-04 VITALS — BP 145/77 | HR 72 | Ht 62.0 in | Wt 120.0 lb

## 2019-12-04 DIAGNOSIS — I5042 Chronic combined systolic (congestive) and diastolic (congestive) heart failure: Secondary | ICD-10-CM | POA: Diagnosis not present

## 2019-12-04 DIAGNOSIS — I130 Hypertensive heart and chronic kidney disease with heart failure and stage 1 through stage 4 chronic kidney disease, or unspecified chronic kidney disease: Secondary | ICD-10-CM

## 2019-12-04 DIAGNOSIS — E785 Hyperlipidemia, unspecified: Secondary | ICD-10-CM

## 2019-12-04 DIAGNOSIS — I739 Peripheral vascular disease, unspecified: Secondary | ICD-10-CM

## 2019-12-04 DIAGNOSIS — I428 Other cardiomyopathies: Secondary | ICD-10-CM | POA: Diagnosis not present

## 2019-12-04 DIAGNOSIS — N183 Chronic kidney disease, stage 3 unspecified: Secondary | ICD-10-CM

## 2019-12-04 DIAGNOSIS — Z95810 Presence of automatic (implantable) cardiac defibrillator: Secondary | ICD-10-CM

## 2019-12-04 MED ORDER — FUROSEMIDE 80 MG PO TABS: 80.0000 mg | ORAL_TABLET | Freq: Every day | ORAL | 3 refills | Status: DC

## 2019-12-04 NOTE — Patient Instructions (Signed)
Medication Instructions:  Your physician has recommended you make the following change in your medication:  DECREASE: Furosemide 80 mg take one tablet by mouth daily in the morning. Take one tablet by mouth daily in the evening if your weight is above 123 lbs.  STOP: Ranexa *If you need a refill on your cardiac medications before your next appointment, please call your pharmacy*   Lab Work: None If you have labs (blood work) drawn today and your tests are completely normal, you will receive your results only by: Marland Kitchen MyChart Message (if you have MyChart) OR . A paper copy in the mail If you have any lab test that is abnormal or we need to change your treatment, we will call you to review the results.   Testing/Procedures: Your physician has requested that you have an ankle brachial index (ABI). During this test an ultrasound and blood pressure cuff are used to evaluate the arteries that supply the arms and legs with blood. Allow thirty minutes for this exam. There are no restrictions or special instructions.     Follow-Up: At [facility], you and your health needs are our priority.  As part of our continuing mission to provide you with exceptional heart care, we have created designated Provider Care Teams.  These Care Teams include your primary Cardiologist (physician) and Advanced Practice Providers (APPs -  Physician Assistants and Nurse Practitioners) who all work together to provide you with the care you need, when you need it.  We recommend signing up for the patient portal called "MyChart".  Sign up information is provided on this After Visit Summary.  MyChart is used to connect with patients for Virtual Visits (Telemedicine).  Patients are able to view lab/test results, encounter notes, upcoming appointments, etc.  Non-urgent messages can be sent to your provider as well.   To learn more about what you can do with MyChart, go to [email or web address].    Your next appointment:    6 month(s)  The format for your next appointment:   In Person  Provider:   Norman Herrlich, MD   Other Instructions

## 2019-12-16 ENCOUNTER — Other Ambulatory Visit: Payer: Medicare PPO | Admitting: Orthotics

## 2019-12-16 ENCOUNTER — Other Ambulatory Visit: Payer: Self-pay

## 2019-12-22 ENCOUNTER — Ambulatory Visit (INDEPENDENT_AMBULATORY_CARE_PROVIDER_SITE_OTHER): Payer: Medicare PPO | Admitting: *Deleted

## 2019-12-22 DIAGNOSIS — I5042 Chronic combined systolic (congestive) and diastolic (congestive) heart failure: Secondary | ICD-10-CM | POA: Diagnosis not present

## 2019-12-26 LAB — CUP PACEART REMOTE DEVICE CHECK
Battery Remaining Longevity: 50 mo
Battery Remaining Percentage: 88 %
Battery Voltage: 2.95 V
Brady Statistic AP VP Percent: 1 %
Brady Statistic AP VS Percent: 1 %
Brady Statistic AS VP Percent: 99 %
Brady Statistic AS VS Percent: 1 %
Brady Statistic RA Percent Paced: 1 %
Date Time Interrogation Session: 20210920085742
HighPow Impedance: 61 Ohm
Implantable Lead Implant Date: 20140702
Implantable Lead Implant Date: 20140702
Implantable Lead Implant Date: 20150730
Implantable Lead Location: 753858
Implantable Lead Location: 753859
Implantable Lead Location: 753860
Implantable Lead Model: 7122
Implantable Pulse Generator Implant Date: 20210322
Lead Channel Impedance Value: 330 Ohm
Lead Channel Impedance Value: 460 Ohm
Lead Channel Impedance Value: 460 Ohm
Lead Channel Pacing Threshold Amplitude: 0.75 V
Lead Channel Pacing Threshold Amplitude: 1 V
Lead Channel Pacing Threshold Amplitude: 2.75 V
Lead Channel Pacing Threshold Pulse Width: 0.4 ms
Lead Channel Pacing Threshold Pulse Width: 0.5 ms
Lead Channel Pacing Threshold Pulse Width: 1.2 ms
Lead Channel Sensing Intrinsic Amplitude: 11.9 mV
Lead Channel Sensing Intrinsic Amplitude: 2 mV
Lead Channel Setting Pacing Amplitude: 2 V
Lead Channel Setting Pacing Amplitude: 2.5 V
Lead Channel Setting Pacing Amplitude: 3.25 V
Lead Channel Setting Pacing Pulse Width: 0.4 ms
Lead Channel Setting Pacing Pulse Width: 1.2 ms
Lead Channel Setting Sensing Sensitivity: 0.5 mV
Pulse Gen Serial Number: 810001491

## 2019-12-29 NOTE — Progress Notes (Signed)
Remote ICD transmission.   

## 2020-01-07 ENCOUNTER — Telehealth: Payer: Self-pay | Admitting: Sports Medicine

## 2020-01-07 NOTE — Telephone Encounter (Signed)
Pt left message on 10.1 asking if we got her inserts in yet.  I returned call and left message we have not gotten them in yet but I have messaged the manufacturer to get them sent asap. I told pt I would call when I get an update.

## 2020-01-08 ENCOUNTER — Telehealth: Payer: Self-pay | Admitting: Sports Medicine

## 2020-01-08 NOTE — Telephone Encounter (Signed)
Received message from [employer] that the diabetic inserts are shipping today and I left message that they should be here next week and I would call when I get them in.

## 2020-01-30 ENCOUNTER — Encounter: Payer: Self-pay | Admitting: Sports Medicine

## 2020-01-30 ENCOUNTER — Ambulatory Visit: Payer: Medicare PPO | Admitting: Sports Medicine

## 2020-01-30 ENCOUNTER — Other Ambulatory Visit: Payer: Self-pay

## 2020-01-30 DIAGNOSIS — M7741 Metatarsalgia, right foot: Secondary | ICD-10-CM

## 2020-01-30 DIAGNOSIS — B351 Tinea unguium: Secondary | ICD-10-CM

## 2020-01-30 DIAGNOSIS — I739 Peripheral vascular disease, unspecified: Secondary | ICD-10-CM

## 2020-01-30 DIAGNOSIS — M7742 Metatarsalgia, left foot: Secondary | ICD-10-CM | POA: Diagnosis not present

## 2020-01-30 DIAGNOSIS — M79675 Pain in left toe(s): Secondary | ICD-10-CM

## 2020-01-30 DIAGNOSIS — M21969 Unspecified acquired deformity of unspecified lower leg: Secondary | ICD-10-CM | POA: Diagnosis not present

## 2020-01-30 DIAGNOSIS — M79674 Pain in right toe(s): Secondary | ICD-10-CM | POA: Diagnosis not present

## 2020-01-30 DIAGNOSIS — E1140 Type 2 diabetes mellitus with diabetic neuropathy, unspecified: Secondary | ICD-10-CM | POA: Diagnosis not present

## 2020-01-30 DIAGNOSIS — M2040 Other hammer toe(s) (acquired), unspecified foot: Secondary | ICD-10-CM | POA: Diagnosis not present

## 2020-01-30 NOTE — Progress Notes (Signed)
Subjective: Shari [name] is a 85 y.o. female patient with history of diabetes who returns to office today complaining of callus skin and long, painful nails  while ambulating in shoes; unable to trim.  Patient is also here for pickup of diabetic shoes.  Patient denies any changes in medication or health history since last encounter.  Patient Active Problem List   Diagnosis Date Noted  . Hypertension   . High cholesterol   . GERD (gastroesophageal reflux disease)   . Anxiety   . Stroke (HCC) 08/07/2019  . Dizziness 06/30/2018  . History of ischemic stroke 06/30/2018  . Rectal bleeding 11/19/2017  . Dysuria 06/15/2017  . Chronic kidney disease, stage 3 (HCC) 12/05/2016  . Gout 12/02/2016  . Migraine 12/02/2016  . Nephrolithiasis 12/02/2016  . LBBB (left bundle branch block) 12/02/2016  . Renal artery stenosis (HCC) 12/02/2016  . Vitamin D deficiency 06/20/2016  . Elevated troponin 06/04/2016  . Hypokalemia 06/04/2016  . Precordial chest pain 06/04/2016  . Essential hypertension 06/04/2016  . Rheumatoid arthritis involving multiple sites (HCC) 05/21/2015  . 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 Screening for breast cancer 04/02/2015  . Preoperative cardiovascular examination 04/02/2015  . PAD (peripheral artery disease) (HCC) 11/03/2014  . Peripheral arterial occlusive disease (HCC) 11/03/2014  . ICD (implantable cardioverter-defibrillator) in place 10/25/2014  . 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 Chronic combined systolic and diastolic heart failure (HCC) 10/25/2014  . Costochondritis 10/25/2014  . Combined systolic and diastolic congestive heart failure (HCC) 10/25/2014  . Coronary artery disease 10/25/2014  . Mild CAD 10/25/2014  . Hypertensive heart disease with heart failure (HCC) 10/25/2014  . Chronic coronary artery disease 10/25/2014  . Automatic implantable cardioverter-defibrillator in  situ 10/25/2014  . Mixed hyperlipidemia 11/10/2010  . Hypertensive heart and kidney disease with heart failure and with chronic kidney disease stage III (HCC) 11/10/2010  . ibs 11/10/2010  . Osteoarthritis 11/10/2010  . Diverticulitis 11/10/2010  . Gastroesophageal reflux disease 11/10/2010  . Glaucoma 11/10/2010  . Type 2 diabetes mellitus (HCC) 11/10/2010  . Onychomycosis 11/10/2010   Current Outpatient Medications on File Prior to Visit  Medication Sig Dispense Refill  . acetaminophen (TYLENOL) 650 MG CR tablet Take 1,300 mg by mouth in the morning and at bedtime.    Marland Kitchen acyclovir (ZOVIRAX) 400 MG tablet Take 400 mg by mouth 5 (five) times daily.    Marland Kitchen atorvastatin (LIPITOR) 40 MG tablet Take 40 mg by mouth at bedtime.     . Calcium Carbonate-Vit D-Min (CALCIUM 1200 PO) Take 1 tablet by mouth daily.     . clopidogrel (PLAVIX) 75 MG tablet TAKE ONE TABLET BY MOUTH EVERY DAY 90 tablet 2  . DEXILANT 60 MG capsule Take 60 mg by mouth daily.     . furosemide (LASIX) 80 MG tablet Take 1 tablet (80 mg total) by mouth daily. Take 1 tablet by mouth daily. Take an extra tablet by mouth daily in the evening if you weigh more than 123 lbs. 90 tablet 3  . glipiZIDE (GLUCOTROL) 10 MG tablet Take 10 mg by mouth 2 (two) times daily before a meal.     . isosorbide mononitrate (IMDUR) 60 MG 24 hr tablet Take 1 tablet (60 mg total) by mouth daily. 90 tablet 2  . JANUVIA 25 MG tablet Take 25 mg by mouth daily.    Marland Kitchen ketoconazole (NIZORAL) 2 % shampoo Apply 1 application topically 2 (two) times a week.    Marland Kitchen  metoprolol succinate (TOPROL-XL) 25 MG 24 hr tablet Take 1 tablet (25 mg total) by mouth daily. 90 tablet 2  . montelukast (SINGULAIR) 10 MG tablet Take 10 mg by mouth at bedtime.    Marland Kitchen NITROSTAT 0.4 MG SL tablet DISSOLVE 1 TABLET UNDER THE TONGUE AS NEEDED FOR CHEST PAIN. 25 tablet 8  . nystatin (MYCOSTATIN) 100000 UNIT/ML suspension Take 5 mLs by mouth 4 (four) times daily.    . potassium chloride  (KLOR-CON) 10 MEQ tablet Take 1 tablet (10 mEq total) by mouth daily. 90 tablet 3  . Prodigy Twist Top Lancets 28G MISC by Does not apply route.    . ramipril (ALTACE) 10 MG capsule Take 10 mg by mouth daily.      . sodium chloride (MURO 128) 5 % ophthalmic solution 1 drop 2 (two) times daily.    Marland Kitchen terbinafine (LAMISIL) 250 MG tablet Take 250 mg by mouth daily as needed (Dermatitis on scalp).     . VASCEPA 1 g CAPS Take 2 g by mouth 2 (two) times daily.   3  . Vitamin D, Ergocalciferol, (DRISDOL) 50000 units CAPS capsule Take 50,000 Units by mouth every 14 (fourteen) days.      No current facility-administered medications on file prior to visit.   Allergies  Allergen Reactions  . Codeine Diarrhea, Nausea And Vomiting, Other (See Comments) and Hives    All-over body aches, too Other reaction(s): GI intolerance  . Dulaglutide Other (See Comments)    Chest pain, N/V  . Ivp Dye [Iodinated Diagnostic Agents] Nausea And Vomiting and Other (See Comments)  . Metformin Other (See Comments)  . Metrizamide Nausea And Vomiting and Other (See Comments)  . Pioglitazone Other (See Comments)    Has CHF    Recent Results (from the past 2160 hour(s))  CUP PACEART INCLINIC DEVICE CHECK     Status: None   Collection Time: 11/17/19  2:30 PM  Result Value Ref Range   Date Time Interrogation Session [id number]    Pulse Generator Manufacturer OTHER    Pulse Gen Model CDHFA500Q Gallant HF    Pulse Gen Serial Number [id number]    Clinic Name [facility]    Implantable Pulse Generator Type Cardiac Resynch Therapy Defibulator    Implantable Pulse Generator Implant Date 58099833    Implantable Lead Manufacturer [facility]    Implantable Lead Model 1458Q Quartet    Implantable Lead Serial Number [id number]    Implantable Lead Implant Date 82505397    Implantable Lead Location Detail 1 UNKNOWN    Implantable Lead Location [id number]    Implantable Lead Manufacturer [facility]    Implantable Lead Model [phone]  Tendril STS    Implantable Lead Serial Number [id number]    Implantable Lead Implant Date 37902409    Implantable Lead Location Detail 1 UNKNOWN    Implantable Lead Location [id number]    Implantable Lead Manufacturer [facility]    Implantable Lead Model [id number] Durata    Implantable Lead Serial Number [id number]    Implantable Lead Implant Date 73532992    Implantable Lead Location Detail 1 UNKNOWN    Implantable Lead Location [id number]    Lead Channel Setting Sensing Sensitivity 0.5 mV   Lead Channel Setting Pacing Amplitude 2.0 V   Lead Channel Setting Pacing Pulse Width 0.4 ms   Lead Channel Setting Pacing Amplitude 2.5 V   Lead Channel Setting Pacing Pulse Width 1.2 ms   Lead Channel Setting Pacing Amplitude 3.25 V   Lead Channel Setting Pacing Capture  Mode Fixed Pacing    Lead Channel Impedance Value 450.0 ohm   Lead Channel Sensing Intrinsic Amplitude 2.1 mV   Lead Channel Pacing Threshold Amplitude 0.75 V   Lead Channel Pacing Threshold Pulse Width 0.5 ms   Lead Channel Impedance Value 337.5 ohm   Lead Channel Sensing Intrinsic Amplitude 11.9 mV   Lead Channel Pacing Threshold Amplitude 1.0 V   Lead Channel Pacing Threshold Pulse Width 0.4 ms   HighPow Impedance 62 ohm   Lead Channel Impedance Value 500.0 ohm   Lead Channel Pacing Threshold Amplitude 2.75 V   Lead Channel Pacing Threshold Pulse Width 1.2 ms   Battery Remaining Longevity 50 mo   Brady Statistic RA Percent Paced 0.07 %   Brady Statistic RV Percent Paced 99.99 %   Eval Rhythm AS/VS 75   CUP PACEART REMOTE DEVICE CHECK     Status: None   Collection Time: 12/22/19  8:57 AM  Result Value Ref Range   Date Time Interrogation Session [id number]    Pulse Generator Manufacturer OTHER    Pulse Gen Model CDHFA500Q Gallant HF    Pulse Gen Serial Number [id number]    Clinic Name [facility]    Implantable Pulse Generator Type Cardiac Resynch Therapy Defibulator    Implantable Pulse Generator Implant Date 52778242     Implantable Lead Manufacturer [facility]    Implantable Lead Model 1458Q Quartet    Implantable Lead Serial Number [id number]    Implantable Lead Implant Date 35361443    Implantable Lead Location Detail 1 UNKNOWN    Implantable Lead Location [id number]    Implantable Lead Manufacturer [facility]    Implantable Lead Model [phone] Tendril STS    Implantable Lead Serial Number [id number]    Implantable Lead Implant Date 67619509    Implantable Lead Location Detail 1 UNKNOWN    Implantable Lead Location [id number]    Implantable Lead Manufacturer [facility]    Implantable Lead Model 7122 Durata    Implantable Lead Serial Number [id number]    Implantable Lead Implant Date 32671245    Implantable Lead Location Detail 1 UNKNOWN    Implantable Lead Location [id number]    Lead Channel Setting Sensing Sensitivity 0.5 mV   Lead Channel Setting Sensing Adaptation Mode Adaptive Sensing    Lead Channel Setting Pacing Amplitude 2.0 V   Lead Channel Setting Pacing Pulse Width 0.4 ms   Lead Channel Setting Pacing Amplitude 2.5 V   Lead Channel Setting Pacing Pulse Width 1.2 ms   Lead Channel Setting Pacing Amplitude 3.25 V   Lead Channel Setting Pacing Capture Mode Fixed Pacing    Lead Channel Status NULL    Lead Channel Impedance Value 460 ohm   Lead Channel Pacing Threshold Amplitude 2.75 V   Lead Channel Pacing Threshold Pulse Width 1.2 ms   Lead Channel Status NULL    Lead Channel Impedance Value 460 ohm   Lead Channel Sensing Intrinsic Amplitude 2.0 mV   Lead Channel Pacing Threshold Amplitude 0.75 V   Lead Channel Pacing Threshold Pulse Width 0.5 ms   Lead Channel Status NULL    Lead Channel Impedance Value 330 ohm   Lead Channel Sensing Intrinsic Amplitude 11.9 mV   Lead Channel Pacing Threshold Amplitude 1.0 V   Lead Channel Pacing Threshold Pulse Width 0.4 ms   HighPow Impedance 61 ohm   HighPow Imped Status NULL    Battery Status MOS    Battery Remaining Longevity 50 mo   Battery Remaining Percentage 88.0 %  Battery Voltage 2.95 V   Brady Statistic RA Percent Paced 1.0 %   Brady Statistic AP VP Percent 1.0 %   Brady Statistic AS VP Percent 99.0 %   Brady Statistic AP VS Percent 1.0 %   Brady Statistic AS VS Percent 1.0 %    Objective: General: Patient is awake, alert, and oriented x 3 and in no acute distress.  Integument: Skin is warm, dry and supple bilateral. Nails are tender, long, thickened and dystrophic with subungual debris, consistent with onychomycosis, 2-5 bilateral. There are no nails on bilateral hallux, or very limited nail due to previous nail procedures. No open lesions present bilateral. Remaining integument unremarkable.  Vasculature: Dorsalis Pedis pulse 1/4 bilateral. Posterior Tibial pulse 0/4 bilateral due to trace edema at ankles. Capillary fill time <5 sec 1-5 bilateral. Scant hair growth to the level of the digits.Temperature gradient within normal limits. Mild varicosities present bilateral.  Neurology: The patient has slightly diminished sensation measured with a 5.07/10g Semmes Weinstein Monofilament at all pedal sites bilateral .  Musculoskeletal: Asymptomatic mild hammertoes pedal deformities noted bilateral. Muscular strength 5/5 in all lower extremity muscular groups bilateral without pain on range of motion . No tenderness with calf compression bilateral.  Assessment and Plan: Problem List Items Addressed This Visit      Cardiovascular and Mediastinum   PAD (peripheral artery disease) (HCC)    Other Visit Diagnoses    Pain due to onychomycosis of toenails of both feet    -  Primary   Type 2 diabetes, controlled, with neuropathy (HCC)       Deformity of foot, unspecified laterality       Metatarsalgia of both feet       Hammer toe, unspecified laterality         -Examined patient. -Re-Discussed and educated patient daily foot inspection in the setting of diabetes  -Mechanically debrided all nails bilateral using sterile nail nipper and filed with dremel  without incident & smoothed callus2 on right foot using rotary bur without incident.   -Dispensed diabetic shoes to patient at this visit there was a small amount of heel slippage noted so at a pad underneath her tongue to help with this patient seemed to be satisfied with fit of shoes and was able to ambulate 20 feet without pain or discomfort -Return in 2-1/2-3 months for nail care -Patient advised to call the office if any problems or questions arise in the meantime.  Asencion Islam, DPM

## 2020-02-04 ENCOUNTER — Other Ambulatory Visit: Payer: Self-pay | Admitting: Cardiology

## 2020-02-04 DIAGNOSIS — I739 Peripheral vascular disease, unspecified: Secondary | ICD-10-CM

## 2020-02-24 ENCOUNTER — Ambulatory Visit: Payer: Medicare PPO | Admitting: Sports Medicine

## 2020-03-22 ENCOUNTER — Ambulatory Visit (INDEPENDENT_AMBULATORY_CARE_PROVIDER_SITE_OTHER): Payer: Medicare PPO

## 2020-03-22 DIAGNOSIS — I5042 Chronic combined systolic (congestive) and diastolic (congestive) heart failure: Secondary | ICD-10-CM

## 2020-03-29 LAB — CUP PACEART REMOTE DEVICE CHECK
Battery Remaining Longevity: 47 mo
Battery Remaining Percentage: 82 %
Battery Voltage: 2.95 V
Brady Statistic AP VP Percent: 1 %
Brady Statistic AP VS Percent: 1 %
Brady Statistic AS VP Percent: 99 %
Brady Statistic AS VS Percent: 1 %
Brady Statistic RA Percent Paced: 1 %
Date Time Interrogation Session: 20211221112316
HighPow Impedance: 62 Ohm
Implantable Lead Implant Date: 20140702
Implantable Lead Implant Date: 20140702
Implantable Lead Implant Date: 20150730
Implantable Lead Location: 753858
Implantable Lead Location: 753859
Implantable Lead Location: 753860
Implantable Lead Model: 7122
Implantable Pulse Generator Implant Date: 20210322
Lead Channel Impedance Value: 330 Ohm
Lead Channel Impedance Value: 450 Ohm
Lead Channel Impedance Value: 460 Ohm
Lead Channel Pacing Threshold Amplitude: 0.75 V
Lead Channel Pacing Threshold Amplitude: 1 V
Lead Channel Pacing Threshold Amplitude: 2.75 V
Lead Channel Pacing Threshold Pulse Width: 0.4 ms
Lead Channel Pacing Threshold Pulse Width: 0.5 ms
Lead Channel Pacing Threshold Pulse Width: 1.2 ms
Lead Channel Sensing Intrinsic Amplitude: 1.9 mV
Lead Channel Sensing Intrinsic Amplitude: 11.9 mV
Lead Channel Setting Pacing Amplitude: 2 V
Lead Channel Setting Pacing Amplitude: 2.5 V
Lead Channel Setting Pacing Amplitude: 3.25 V
Lead Channel Setting Pacing Pulse Width: 0.4 ms
Lead Channel Setting Pacing Pulse Width: 1.2 ms
Lead Channel Setting Sensing Sensitivity: 0.5 mV
Pulse Gen Serial Number: 810001491

## 2020-04-01 ENCOUNTER — Telehealth: Payer: Self-pay | Admitting: Emergency Medicine

## 2020-04-01 NOTE — Telephone Encounter (Signed)
Patient reports stinging in area of pacemaker and under left arm in axillary region. She cancelled an appointment with Dr Elberta Fortis due to her husbands hospitalization. No edema, redness or drainage at pacemaker site. Will send to scheduler for appointment with Dr Elberta Fortis in [place].

## 2020-04-06 NOTE — Progress Notes (Signed)
Remote ICD transmission.   

## 2020-04-09 ENCOUNTER — Ambulatory Visit: Payer: Medicare PPO | Admitting: Sports Medicine

## 2020-04-09 ENCOUNTER — Encounter: Payer: Self-pay | Admitting: Sports Medicine

## 2020-04-09 ENCOUNTER — Other Ambulatory Visit: Payer: Self-pay

## 2020-04-09 DIAGNOSIS — E1140 Type 2 diabetes mellitus with diabetic neuropathy, unspecified: Secondary | ICD-10-CM | POA: Diagnosis not present

## 2020-04-09 DIAGNOSIS — M79675 Pain in left toe(s): Secondary | ICD-10-CM | POA: Diagnosis not present

## 2020-04-09 DIAGNOSIS — I739 Peripheral vascular disease, unspecified: Secondary | ICD-10-CM

## 2020-04-09 DIAGNOSIS — B351 Tinea unguium: Secondary | ICD-10-CM | POA: Diagnosis not present

## 2020-04-09 DIAGNOSIS — M79674 Pain in right toe(s): Secondary | ICD-10-CM

## 2020-04-09 NOTE — Progress Notes (Signed)
Subjective: Shari [name] is a 86 y.o. female patient with history of diabetes who returns to office today complaining of callus skin and long, painful nails  while ambulating in shoes; unable to trim.  Patient denies any changes in medication or health history since last encounter.  FBS not recorded   PCP visit 2 months ago  Patient Active Problem List   Diagnosis Date Noted   Hypertension    High cholesterol    GERD (gastroesophageal reflux disease)    Anxiety    Stroke (HCC) 08/07/2019   Dizziness 06/30/2018   History of ischemic stroke 06/30/2018   Rectal bleeding 11/19/2017   Dysuria 06/15/2017   Chronic kidney disease, stage 3 (HCC) 12/05/2016   Gout 12/02/2016   Migraine 12/02/2016   Nephrolithiasis 12/02/2016   LBBB (left bundle branch block) 12/02/2016   Renal artery stenosis (HCC) 12/02/2016   Vitamin D deficiency 06/20/2016   Elevated troponin 06/04/2016   Hypokalemia 06/04/2016   Precordial chest pain 06/04/2016   Essential hypertension 06/04/2016   Rheumatoid arthritis involving multiple sites (HCC) 05/21/2015   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) 05/21/2015   Screening for breast cancer 04/02/2015   Preoperative cardiovascular examination 04/02/2015   PAD (peripheral artery disease) (HCC) 11/03/2014   Peripheral arterial occlusive disease (HCC) 11/03/2014   ICD (implantable cardioverter-defibrillator) in place 10/25/2014   Biventricular ICD (implantable cardioverter-defibrillator) in place 10/25/2014   Chronic combined systolic and diastolic heart failure (HCC) 10/25/2014   Costochondritis 10/25/2014   Combined systolic and diastolic congestive heart failure (HCC) 10/25/2014   Coronary artery disease 10/25/2014   Mild CAD 10/25/2014   Hypertensive heart disease with heart failure (HCC) 10/25/2014   Chronic coronary artery disease 10/25/2014   Automatic implantable cardioverter-defibrillator in situ  10/25/2014   Mixed hyperlipidemia 11/10/2010   Hypertensive heart and kidney disease with heart failure and with chronic kidney disease stage III (HCC) 11/10/2010   ibs 11/10/2010   Osteoarthritis 11/10/2010   Diverticulitis 11/10/2010   Gastroesophageal reflux disease 11/10/2010   Glaucoma 11/10/2010   Type 2 diabetes mellitus (HCC) 11/10/2010   Onychomycosis 11/10/2010   Current Outpatient Medications on File Prior to Visit  Medication Sig Dispense Refill   acetaminophen (TYLENOL) 650 MG CR tablet Take 1,300 mg by mouth in the morning and at bedtime.     acyclovir (ZOVIRAX) 400 MG tablet Take 400 mg by mouth 5 (five) times daily.     atorvastatin (LIPITOR) 40 MG tablet Take 40 mg by mouth at bedtime.      Calcium Carbonate-Vit D-Min (CALCIUM 1200 PO) Take 1 tablet by mouth daily.      clopidogrel (PLAVIX) 75 MG tablet TAKE ONE TABLET BY MOUTH EVERY DAY 90 tablet 2   DEXILANT 60 MG capsule Take 60 mg by mouth daily.      furosemide (LASIX) 80 MG tablet Take 1 tablet (80 mg total) by mouth daily. Take 1 tablet by mouth daily. Take an extra tablet by mouth daily in the evening if you weigh more than 123 lbs. 90 tablet 3   glipiZIDE (GLUCOTROL) 10 MG tablet Take 10 mg by mouth 2 (two) times daily before a meal.      isosorbide mononitrate (IMDUR) 60 MG 24 hr tablet Take 1 tablet (60 mg total) by mouth daily. 90 tablet 2   JANUVIA 25 MG tablet Take 25 mg by mouth daily.     ketoconazole (NIZORAL) 2 % shampoo Apply 1 application topically 2 (two) times a week.  metoprolol succinate (TOPROL-XL) 25 MG 24 hr tablet Take 1 tablet (25 mg total) by mouth daily. 90 tablet 2   montelukast (SINGULAIR) 10 MG tablet Take 10 mg by mouth at bedtime.     NITROSTAT 0.4 MG SL tablet DISSOLVE 1 TABLET UNDER THE TONGUE AS NEEDED FOR CHEST PAIN. 25 tablet 8   nystatin (MYCOSTATIN) 100000 UNIT/ML suspension Take 5 mLs by mouth 4 (four) times daily.     potassium chloride (KLOR-CON) 10  MEQ tablet Take 1 tablet (10 mEq total) by mouth daily. 90 tablet 3   Prodigy Twist Top Lancets 28G MISC by Does not apply route.     ramipril (ALTACE) 10 MG capsule Take 10 mg by mouth daily.       sodium chloride (MURO 128) 5 % ophthalmic solution 1 drop 2 (two) times daily.     terbinafine (LAMISIL) 250 MG tablet Take 250 mg by mouth daily as needed (Dermatitis on scalp).      triamcinolone (KENALOG) 0.1 %      VASCEPA 1 g CAPS Take 2 g by mouth 2 (two) times daily.   3   Vitamin D, Ergocalciferol, (DRISDOL) 50000 units CAPS capsule Take 50,000 Units by mouth every 14 (fourteen) days.      No current facility-administered medications on file prior to visit.   Allergies  Allergen Reactions   Codeine Diarrhea, Nausea And Vomiting, Other (See Comments) and Hives    All-over body aches, too Other reaction(s): GI intolerance   Dulaglutide Other (See Comments)    Chest pain, N/V   Ivp Dye [Iodinated Diagnostic Agents] Nausea And Vomiting and Other (See Comments)   Metformin Other (See Comments)   Metrizamide Nausea And Vomiting and Other (See Comments)   Pioglitazone Other (See Comments)    Has CHF    Recent Results (from the past 2160 hour(s))  CUP PACEART REMOTE DEVICE CHECK     Status: None   Collection Time: 03/23/20 11:23 AM  Result Value Ref Range   Date Time Interrogation Session [phone]    Pulse Generator Manufacturer OTHER    Pulse Gen Model CDHFA500Q Gallant HF    Pulse Gen Serial Number [id number]    Clinic Name [facility]    Implantable Pulse Generator Type Cardiac Resynch Therapy Defibulator    Implantable Pulse Generator Implant Date [id number]    Implantable Lead Manufacturer [facility]    Implantable Lead Model 1458Q Quartet    Implantable Lead Serial Number [id number]    Implantable Lead Implant Date [id number]    Implantable Lead Location Detail 1 UNKNOWN    Implantable Lead Location [id number]    Implantable Lead Manufacturer [facility]    Implantable Lead  Model [id number] Tendril STS    Implantable Lead Serial Number [id number]    Implantable Lead Implant Date 34193790    Implantable Lead Location Detail 1 UNKNOWN    Implantable Lead Location [id number]    Implantable Lead Manufacturer [facility]    Implantable Lead Model C1931474 Durata    Implantable Lead Serial Number [id number]    Implantable Lead Implant Date 24097353    Implantable Lead Location Detail 1 UNKNOWN    Implantable Lead Location [id number]    Lead Channel Setting Sensing Sensitivity 0.5 mV   Lead Channel Setting Sensing Adaptation Mode Adaptive Sensing    Lead Channel Setting Pacing Amplitude 2.0 V   Lead Channel Setting Pacing Pulse Width 0.4 ms   Lead Channel Setting Pacing Amplitude 2.5 V   Lead Channel Setting Pacing Pulse  Width 1.2 ms   Lead Channel Setting Pacing Amplitude 3.25 V   Lead Channel Setting Pacing Capture Mode Fixed Pacing    Lead Channel Status     Lead Channel Impedance Value 460 ohm   Lead Channel Pacing Threshold Amplitude 2.75 V   Lead Channel Pacing Threshold Pulse Width 1.2 ms   Lead Channel Status     Lead Channel Impedance Value 450 ohm   Lead Channel Sensing Intrinsic Amplitude 1.9 mV   Lead Channel Pacing Threshold Amplitude 0.75 V   Lead Channel Pacing Threshold Pulse Width 0.5 ms   Lead Channel Status     Lead Channel Impedance Value 330 ohm   Lead Channel Sensing Intrinsic Amplitude 11.9 mV   Lead Channel Pacing Threshold Amplitude 1.0 V   Lead Channel Pacing Threshold Pulse Width 0.4 ms   HighPow Impedance 62 ohm   HighPow Imped Status     Battery Status MOS    Battery Remaining Longevity 47 mo   Battery Remaining Percentage 82.0 %   Battery Voltage 2.95 V   Brady Statistic RA Percent Paced 1.0 %   Brady Statistic AP VP Percent 1.0 %   Brady Statistic AS VP Percent 99.0 %   Brady Statistic AP VS Percent 1.0 %   Brady Statistic AS VS Percent 1.0 %    Objective: General: Patient is awake, alert, and oriented x 3 and in no acute  distress.  Integument: Skin is warm, dry and supple bilateral. Nails are tender, long, thickened and dystrophic with subungual debris, consistent with onychomycosis, 2-5 bilateral. There are no nails on bilateral hallux, or very limited nail due to previous nail procedures. No open lesions present bilateral. Remaining integument unremarkable.  Vasculature: Dorsalis Pedis pulse 1/4 bilateral. Posterior Tibial pulse 0/4 bilateral due to trace edema at ankles. Capillary fill time <5 sec 1-5 bilateral. Scant hair growth to the level of the digits.Temperature gradient within normal limits. Mild varicosities present bilateral.  Neurology: The patient has slightly diminished sensation measured with a 5.07/10g Semmes Weinstein Monofilament at all pedal sites bilateral .  Musculoskeletal: Asymptomatic mild hammertoes pedal deformities noted bilateral. Muscular strength 5/5 in all lower extremity muscular groups bilateral without pain on range of motion . No tenderness with calf compression bilateral.  Assessment and Plan: Problem List Items Addressed This Visit      Cardiovascular and Mediastinum   PAD (peripheral artery disease) ([place])    Other Visit Diagnoses    Pain due to onychomycosis of toenails of both feet    -  Primary   Type 2 diabetes, controlled, with neuropathy ([place])         -Examined patient. -Re-Discussed and educated patient daily foot inspection in the setting of diabetes  -Mechanically debrided all nails bilateral using sterile nail nipper and filed with dremel without incident & smoothed callus2 on right foot using rotary bur without incident.   -Return in 3 months -Patient advised to call the office if any problems or questions arise in the meantime.  Landis Martins, DPM

## 2020-04-13 ENCOUNTER — Other Ambulatory Visit: Payer: Self-pay

## 2020-04-13 ENCOUNTER — Encounter: Payer: Self-pay | Admitting: Cardiology

## 2020-04-13 ENCOUNTER — Ambulatory Visit: Payer: Medicare PPO | Admitting: Cardiology

## 2020-04-13 VITALS — BP 102/62 | HR 74 | Ht 62.0 in | Wt 121.0 lb

## 2020-04-13 DIAGNOSIS — I5022 Chronic systolic (congestive) heart failure: Secondary | ICD-10-CM

## 2020-04-13 NOTE — Patient Instructions (Signed)
Medication Instructions:  Your physician recommends that you continue on your current medications as directed. Please refer to the Current Medication list given to you today.  *If you need a refill on your cardiac medications before your next appointment, please call your pharmacy*   Lab Work: None ordered If you have labs (blood work) drawn today and your tests are completely normal, you will receive your results only by: Marland Kitchen MyChart Message (if you have MyChart) OR . A paper copy in the mail If you have any lab test that is abnormal or we need to change your treatment, we will call you to review the results.   Testing/Procedures: None ordered   Follow-Up: At [facility], you and your health needs are our priority.  As part of our continuing mission to provide you with exceptional heart care, we have created designated Provider Care Teams.  These Care Teams include your primary Cardiologist (physician) and Advanced Practice Providers (APPs -  Physician Assistants and Nurse Practitioners) who all work together to provide you with the care you need, when you need it.  We recommend signing up for the patient portal called "MyChart".  Sign up information is provided on this After Visit Summary.  MyChart is used to connect with patients for Virtual Visits (Telemedicine).  Patients are able to view lab/test results, encounter notes, upcoming appointments, etc.  Non-urgent messages can be sent to your provider as well.   To learn more about what you can do with MyChart, go to [email or web address].    Remote monitoring is used to monitor your Pacemaker or ICD from home. This monitoring reduces the number of office visits required to check your device to one time per year. It allows [place] to keep an eye on the functioning of your device to ensure it is working properly. You are scheduled for a device check from home on 06/21/20. You may send your transmission at any time that day. If you have a  wireless device, the transmission will be sent automatically. After your physician reviews your transmission, you will receive a postcard with your next transmission date.  Your next appointment:   1 year(s)  The format for your next appointment:   In Person  Provider:   Loman Brooklyn, MD   Thank you for choosing [facility]!!   Dory Horn, RN [phone]    Other Instructions

## 2020-04-13 NOTE — Progress Notes (Signed)
Electrophysiology Office Note   Date:  04/13/2020   ID:  Shari [name], Shari [name] 01/30/1935, MRN [id number]  PCP:  Olive Bass, MD  Cardiologist:  Dulce Sellar Primary Electrophysiologist:  Idelle Reimann [name], MD    No chief complaint on file.    History of Present Illness: Shari [name] is a 86 y.o. female who is being seen today for the evaluation of CHF at the request of [name], Doris Cheadle, MD. Presenting today for electrophysiology evaluation.   She has a history of hypertension, coronary artery disease, chronic systolic heart failure, hyperlipidemia.  She is status post Saint Jude CRT-D with generator change 06/23/2019.  Today, denies symptoms of palpitations, chest pain, shortness of breath, orthopnea, PND, lower extremity edema, claudication, dizziness, presyncope, syncope, bleeding, or neurologic sequela. The patient is tolerating medications without difficulties.  She is feeling well.  She has no chest pain or shortness of breath.  She is able to do all of her daily activities.  She does note a burning sensation in her left axilla.  At times it goes to her right axilla.  This is occurred since her ICD generator change.   Past Medical History:  Diagnosis Date  . Anxiety   . Automatic implantable cardioverter-defibrillator in situ 10/25/2014   Overview:  Overview:  dual chamber ICD,upgraded to CRT with LBBB and nonischemic cardiomyopathy  . Biventricular ICD (implantable cardioverter-defibrillator) in place 10/25/2014   Overview:  BIV -ICD,upgraded to CRT with LBBB and nonischemic cardiomyopathy  . Chronic combined systolic and diastolic heart failure (HCC) 10/25/2014   Overview:  EF 77% by MUGA 06/15/15  Formatting of this note might be different from the original. Overview:  EF 66% by MUGA 03/12/14  Overview:  Overview:  EF 77% by MUGA 06/15/15  Overview:  EF 66% by MUGA 03/12/14  Overview:  Overview:  EF 77% by MUGA 06/15/15 Formatting of this note might be different from the original.  EF 66% by MUGA 03/12/14  . Chronic coronary artery disease 10/25/2014  . Chronic kidney disease, stage 3 (HCC) 12/05/2016  . Combined systolic and diastolic congestive heart failure (HCC) 10/25/2014   Last Assessment & Plan:  Formatting of this note might be different from the original. Type and degree unknown though patient currently appears to be euvolemic.  Marland Kitchen Coronary artery disease 10/25/2014  . Costochondritis 10/25/2014  . Diabetes mellitus 2 years  . Diverticulitis   . Dizziness 06/30/2018   Last Assessment & Plan:  Formatting of this note might be different from the original. 86 yo F with history of CHF, DM, PVD, h/o TIA with double vision in 2013, HTN, HLD.   Yesterday morning she reports that when she woke up and looked over at her clock it looked blurry.  She denies double vision, facial droop or extremity weakness.  When she tried to get up to ambulate she reports that she felt d  . Dysuria 06/15/2017  . Elevated troponin 06/04/2016   Last Assessment & Plan:  Likely may be related to ventricular pacing and underlying cardiomyopathy.  The trajectory of the biomarker elevation and neither is her clinical history suggestive of acute coronary syndrome.  Further stratification with nuclear stress testing  . Essential hypertension 06/04/2016   Last Assessment & Plan:  Formatting of this note might be different from the original. Continue carvedilol  . Gastroesophageal reflux disease 11/10/2010   Last Assessment & Plan:  Formatting of this note might be different from the original. Continue Dexilant.  Marland Kitchen GERD (  gastroesophageal reflux disease)   . Glaucoma   . Gout 12/02/2016  . High cholesterol   . History of ischemic stroke 06/30/2018  . Hypertension   . Hypertensive heart and kidney disease with heart failure and with chronic kidney disease stage III (HCC) 11/10/2010  . Hypertensive heart disease with heart failure (HCC) 10/25/2014  . Hypokalemia 06/04/2016   Last Assessment & Plan:  Replace with  potassium chloride.  . IBS (irritable bowel syndrome)   . ICD (implantable cardioverter-defibrillator) in place 10/25/2014   Overview:  Overview:  dual chamber ICD,upgraded to CRT with LBBB and nonischemic cardiomyopathy  Formatting of this note might be different from the original. Overview:  dual chamber ICD,upgraded to CRT with LBBB and nonischemic cardiomyopathy  Overview:  Overview:  BIV -ICD,upgraded to CRT with LBBB and nonischemic cardiomyopathy  Arturo Morton, CMA - 09/07/2016 3:00 PM EDT REMOTE MONITORING ASSE  . LBBB (left bundle branch block) 12/02/2016  . 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 Migraine 12/02/2016  . Mild CAD 10/25/2014  . Mixed hyperlipidemia 11/10/2010   Last Assessment & Plan:  Formatting of this note might be different from the original. Continue statin therapy.  . Nephrolithiasis 12/02/2016  . Onychomycosis 11/10/2010  . Osteoarthritis   . PAD (peripheral artery disease) (HCC) 11/03/2014   Overview:  status post insertion of aortic stent and left common iliac stent by Dr. Imogene Burn in August of 2016  . Peripheral arterial occlusive disease (HCC) 11/03/2014  . Precordial chest pain 06/04/2016   Last Assessment & Plan:  My suspicion is that this is likely related to gastroesophageal reflux disease with acute indigestion last evening.  The biomarker elevation is not completely unexpected in this elderly patient with reported cardiomyopathy and current demand ventricular pacing.  Nonetheless she does have a history of extensive vascular disease (though apparently no significant coronary atherosclerosis in 2013) so we Darlean Warmoth proceed with a noninvasive risk stratification with pharmacological nuclear stress testing.  Assuming this is a low risk or normal study then I would not advise any invasive cardiac evaluation at this time.  . Preoperative cardiovascular examination 04/02/2015  . Rectal bleeding 11/19/2017   Formatting of this note might be different from  the original. 2019  . Renal artery stenosis (HCC) 12/02/2016  . Rheumatoid arthritis involving multiple sites (HCC) 05/21/2015  . Screening for breast cancer 04/02/2015  . Stroke (HCC)   . Type 2 diabetes mellitus (HCC) 11/10/2010   Last Assessment & Plan:  Formatting of this note might be different from the original. Continue glipizide and add sliding scale insulin coverage Formatting of this note might be different from the original. Per 01/28/2019 Podiatry VN.  Marland Kitchen Vitamin D deficiency 06/20/2016   Past Surgical History:  Procedure Laterality Date  . BIV ICD GENERATOR CHANGEOUT N/A 06/23/2019   Procedure: BIV ICD GENERATOR CHANGEOUT;  Surgeon: Regan Lemming, MD;  Location[facility] INVASIVE CV LAB;  Service: Cardiovascular;  Laterality: N/A;  . FOOT SURGERY    . pacemaker surgery     a-fib and was done july 2014  . PERIPHERAL VASCULAR CATHETERIZATION N/A 11/05/2014   Procedure: Abdominal Aortogram;  Surgeon: Fransisco Hertz, MD;  Location: [facility] INVASIVE CV LAB;  Service: Cardiovascular;  Laterality: N/A;     Current Outpatient Medications  Medication Sig Dispense Refill  . acetaminophen (TYLENOL) 650 MG CR tablet Take 1,300 mg by mouth in the morning and at bedtime.    Marland Kitchen acyclovir (ZOVIRAX) 400 MG tablet Take 400  mg by mouth 5 (five) times daily.    Marland Kitchen atorvastatin (LIPITOR) 40 MG tablet Take 40 mg by mouth at bedtime.     . Calcium Carbonate-Vit D-Min (CALCIUM 1200 PO) Take 1 tablet by mouth daily.    . clopidogrel (PLAVIX) 75 MG tablet TAKE ONE TABLET BY MOUTH EVERY DAY 90 tablet 2  . DEXILANT 60 MG capsule Take 60 mg by mouth daily.     . furosemide (LASIX) 80 MG tablet Take 1 tablet (80 mg total) by mouth daily. Take 1 tablet by mouth daily. Take an extra tablet by mouth daily in the evening if you weigh more than 123 lbs. 90 tablet 3  . glipiZIDE (GLUCOTROL) 10 MG tablet Take 10 mg by mouth 2 (two) times daily before a meal.     . isosorbide mononitrate (IMDUR) 60 MG 24 hr tablet Take 1 tablet  (60 mg total) by mouth daily. 90 tablet 2  . JANUVIA 25 MG tablet Take 25 mg by mouth daily.    Marland Kitchen ketoconazole (NIZORAL) 2 % shampoo Apply 1 application topically 2 (two) times a week.    . metoprolol succinate (TOPROL-XL) 25 MG 24 hr tablet Take 1 tablet (25 mg total) by mouth daily. 90 tablet 2  . montelukast (SINGULAIR) 10 MG tablet Take 10 mg by mouth at bedtime.    . Multiple Vitamins-Minerals (ZINC PO) Take 1 tablet by mouth daily at 12 noon.    Marland Kitchen NITROSTAT 0.4 MG SL tablet DISSOLVE 1 TABLET UNDER THE TONGUE AS NEEDED FOR CHEST PAIN. 25 tablet 8  . nystatin (MYCOSTATIN) 100000 UNIT/ML suspension Take 5 mLs by mouth 4 (four) times daily.    . potassium chloride (KLOR-CON) 10 MEQ tablet Take 1 tablet (10 mEq total) by mouth daily. 90 tablet 3  . Prodigy Twist Top Lancets 28G MISC by Does not apply route.    . ramipril (ALTACE) 10 MG capsule Take 10 mg by mouth daily.    . sodium chloride (MURO 128) 5 % ophthalmic solution 1 drop 2 (two) times daily.    Marland Kitchen terbinafine (LAMISIL) 250 MG tablet Take 250 mg by mouth daily as needed (Dermatitis on scalp).     . triamcinolone (KENALOG) 0.1 %     . VASCEPA 1 g CAPS Take 2 g by mouth 2 (two) times daily.   3  . Vitamin D, Ergocalciferol, (DRISDOL) 50000 units CAPS capsule Take 50,000 Units by mouth every 14 (fourteen) days.      No current facility-administered medications for this visit.    Allergies:   Codeine, Dulaglutide, Ivp dye [iodinated diagnostic agents], Metformin, Metrizamide, and Pioglitazone   Social History:  The patient  reports that she has never smoked. She has never used smokeless tobacco. She reports that she does not drink alcohol and does not use drugs.   Family History:  The patient's family history includes Alzheimer's disease in her mother; Arthritis in her mother and sister; Bladder Cancer in her father; Heart attack in her maternal grandfather and maternal grandmother; Hyperlipidemia in her father and mother; Vitiligo in  her sister.    ROS:  Please see the history of present illness.   Otherwise, review of systems is positive for none.   All other systems are reviewed and negative.   PHYSICAL EXAM: VS:  BP 102/62   Pulse 74   Ht 5\' 2"  (1.575 m)   Wt 121 lb (54.9 kg)   BMI 22.13 kg/m  , BMI Body mass index is 22.13  kg/m. GEN: Well nourished, well developed, in no acute distress  HEENT: normal  Neck: no JVD, carotid bruits, or masses Cardiac: RRR; no murmurs, rubs, or gallops,no edema  Respiratory:  clear to auscultation bilaterally, normal work of breathing GI: soft, nontender, nondistended, + BS MS: no deformity or atrophy  Skin: warm and dry, device site well healed Neuro:  Strength and sensation are intact Psych: euthymic mood, full affect  EKG:  EKG is ordered today. Personal review of the ekg ordered shows sinus rhythm, ventricular paced  Personal review of the device interrogation today. Results in Paceart   Recent Labs: 06/23/2019: BUN 18; Creatinine, Ser 1.17; Hemoglobin 11.6; Platelets 152; Potassium 3.4; Sodium 140    Lipid Panel  No results found for: CHOL, TRIG, HDL, CHOLHDL, VLDL, LDLCALC, LDLDIRECT   Wt Readings from Last 3 Encounters:  04/13/20 121 lb (54.9 kg)  12/04/19 120 lb (54.4 kg)  11/17/19 121 lb 3.2 oz (55 kg)      Other studies Reviewed: Additional studies/ records that were reviewed today include: TTE 05/22/2017 Review of the above records today demonstrates:  - Left ventricle: The cavity size was normal. Systolic function was   normal. The estimated ejection fraction was in the range of 60%   to 65%. Wall motion was normal; there were no regional wall   motion abnormalities. - Aortic valve: There was mild regurgitation. Valve area (VTI):   1.83 cm^2. Valve area (Vmax): 1.72 cm^2. Valve area (Vmean): 1.71   cm^2.   ASSESSMENT AND PLAN:  1. Chronic systolic heart failure: Patient is status post Saint Jude CRT-D. Device is without issue. Currently on  optimal medical therapy. No changes at this time.  Fortunately her ICD site is well-healed.  There is no obvious source of infection.  She could have some nerve damage from her generator change, but since it is not bothering her very much, we Daryus Sowash make no further changes.  2. Hypertension: Currently well controlled  3. Hyperlipidemia: Continue atorvastatin  4. Coronary artery disease: Currently on Plavix. No chest pain. Continue current management.   Current medicines are reviewed at length with the patient today.   The patient does not have concerns regarding her medicines.  The following changes were made today: None  Labs/ tests ordered today include:  Orders Placed This Encounter  Procedures  . EKG 12-Lead     Disposition:   FU with Nivan Melendrez 12 months  Signed, Joseluis Alessio [name], MD  04/13/2020 10:53 AM     [facility] [address] [place] [address] [phone] (office) [phone] (fax)

## 2020-04-20 ENCOUNTER — Other Ambulatory Visit: Payer: Self-pay

## 2020-04-20 DIAGNOSIS — I63239 Cerebral infarction due to unspecified occlusion or stenosis of unspecified carotid arteries: Secondary | ICD-10-CM

## 2020-04-20 DIAGNOSIS — I7409 Other arterial embolism and thrombosis of abdominal aorta: Secondary | ICD-10-CM

## 2020-04-20 DIAGNOSIS — I739 Peripheral vascular disease, unspecified: Secondary | ICD-10-CM

## 2020-04-27 ENCOUNTER — Ambulatory Visit (INDEPENDENT_AMBULATORY_CARE_PROVIDER_SITE_OTHER): Payer: Medicare PPO

## 2020-04-27 ENCOUNTER — Other Ambulatory Visit: Payer: Self-pay

## 2020-04-27 DIAGNOSIS — I739 Peripheral vascular disease, unspecified: Secondary | ICD-10-CM

## 2020-04-27 NOTE — Progress Notes (Signed)
ABI exam has been performed.  Jimmy Secret Kristensen RDCS, RVT 

## 2020-04-29 ENCOUNTER — Other Ambulatory Visit: Payer: Self-pay | Admitting: Cardiology

## 2020-04-29 NOTE — Telephone Encounter (Signed)
Refill sent to pharmacy.   

## 2020-05-11 ENCOUNTER — Encounter (HOSPITAL_COMMUNITY): Payer: Medicare PPO

## 2020-05-11 ENCOUNTER — Ambulatory Visit: Payer: Medicare PPO | Admitting: Vascular Surgery

## 2020-05-31 ENCOUNTER — Ambulatory Visit (INDEPENDENT_AMBULATORY_CARE_PROVIDER_SITE_OTHER): Payer: Medicare PPO | Admitting: Cardiology

## 2020-05-31 ENCOUNTER — Other Ambulatory Visit: Payer: Self-pay

## 2020-05-31 ENCOUNTER — Encounter: Payer: Self-pay | Admitting: Cardiology

## 2020-05-31 VITALS — BP 136/56 | HR 86 | Ht 62.0 in | Wt 124.0 lb

## 2020-05-31 DIAGNOSIS — Z95810 Presence of automatic (implantable) cardiac defibrillator: Secondary | ICD-10-CM

## 2020-05-31 DIAGNOSIS — I5042 Chronic combined systolic (congestive) and diastolic (congestive) heart failure: Secondary | ICD-10-CM | POA: Diagnosis not present

## 2020-05-31 DIAGNOSIS — I5022 Chronic systolic (congestive) heart failure: Secondary | ICD-10-CM

## 2020-05-31 NOTE — Progress Notes (Signed)
Electrophysiology Office Note   Date:  05/31/2020   ID:  Silveria, Botz 1934-12-28, MRN [id number]  PCP:  Olive Bass, MD  Cardiologist:  Dulce Sellar Primary Electrophysiologist:  Ryker Pherigo [name], MD    No chief complaint on file.    History of Present Illness: ANIS CINELLI is a 86 y.o. female who is being seen today for the evaluation of CHF at the request of [name], Doris Cheadle, MD. Presenting today for electrophysiology evaluation.   She has a history significant for hypertension, coronary artery disease, chronic systolic heart failure, and hyperlipidemia.  She is status post Saint Jude CRT-D with generator change 06/23/2019.  Today, denies symptoms of palpitations, chest pain, shortness of breath, orthopnea, PND, lower extremity edema, claudication, dizziness, presyncope, syncope, bleeding, or neurologic sequela. The patient is tolerating medications without difficulties.  Since last being seen she has done well.  She has no chest pain or shortness of breath.  She is able do all of her daily activities without restriction.   Past Medical History:  Diagnosis Date  . Anxiety   . Automatic implantable cardioverter-defibrillator in situ 10/25/2014   Overview:  Overview:  dual chamber ICD,upgraded to CRT with LBBB and nonischemic cardiomyopathy  . Biventricular ICD (implantable cardioverter-defibrillator) in place 10/25/2014   Overview:  BIV -ICD,upgraded to CRT with LBBB and nonischemic cardiomyopathy  . Chronic combined systolic and diastolic heart failure (HCC) 10/25/2014   Overview:  EF 77% by MUGA 06/15/15  Formatting of this note might be different from the original. Overview:  EF 66% by MUGA 03/12/14  Overview:  Overview:  EF 77% by MUGA 06/15/15  Overview:  EF 66% by MUGA 03/12/14  Overview:  Overview:  EF 77% by MUGA 06/15/15 Formatting of this note might be different from the original. EF 66% by MUGA 03/12/14  . Chronic coronary artery disease 10/25/2014  . Chronic kidney  disease, stage 3 (HCC) 12/05/2016  . Combined systolic and diastolic congestive heart failure (HCC) 10/25/2014   Last Assessment & Plan:  Formatting of this note might be different from the original. Type and degree unknown though patient currently appears to be euvolemic.  Marland Kitchen Coronary artery disease 10/25/2014  . Costochondritis 10/25/2014  . Diabetes mellitus 2 years  . Diverticulitis   . Dizziness 06/30/2018   Last Assessment & Plan:  Formatting of this note might be different from the original. 86 yo F with history of CHF, DM, PVD, h/o TIA with double vision in 2013, HTN, HLD.   Yesterday morning she reports that when she woke up and looked over at her clock it looked blurry.  She denies double vision, facial droop or extremity weakness.  When she tried to get up to ambulate she reports that she felt d  . Dysuria 06/15/2017  . Elevated troponin 06/04/2016   Last Assessment & Plan:  Likely may be related to ventricular pacing and underlying cardiomyopathy.  The trajectory of the biomarker elevation and neither is her clinical history suggestive of acute coronary syndrome.  Further stratification with nuclear stress testing  . Essential hypertension 06/04/2016   Last Assessment & Plan:  Formatting of this note might be different from the original. Continue carvedilol  . Gastroesophageal reflux disease 11/10/2010   Last Assessment & Plan:  Formatting of this note might be different from the original. Continue Dexilant.  Marland Kitchen GERD (gastroesophageal reflux disease)   . Glaucoma   . Gout 12/02/2016  . High cholesterol   . History of ischemic  stroke 06/30/2018  . Hypertension   . Hypertensive heart and kidney disease with heart failure and with chronic kidney disease stage III (HCC) 11/10/2010  . Hypertensive heart disease with heart failure (HCC) 10/25/2014  . Hypokalemia 06/04/2016   Last Assessment & Plan:  Replace with potassium chloride.  . IBS (irritable bowel syndrome)   . ICD (implantable  cardioverter-defibrillator) in place 10/25/2014   Overview:  Overview:  dual chamber ICD,upgraded to CRT with LBBB and nonischemic cardiomyopathy  Formatting of this note might be different from the original. Overview:  dual chamber ICD,upgraded to CRT with LBBB and nonischemic cardiomyopathy  Overview:  Overview:  BIV -ICD,upgraded to CRT with LBBB and nonischemic cardiomyopathy  Arturo Morton, CMA - 09/07/2016 3:00 PM EDT REMOTE MONITORING ASSE  . LBBB (left bundle branch block) 12/02/2016  . 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 Migraine 12/02/2016  . Mild CAD 10/25/2014  . Mixed hyperlipidemia 11/10/2010   Last Assessment & Plan:  Formatting of this note might be different from the original. Continue statin therapy.  . Nephrolithiasis 12/02/2016  . Onychomycosis 11/10/2010  . Osteoarthritis   . PAD (peripheral artery disease) (HCC) 11/03/2014   Overview:  status post insertion of aortic stent and left common iliac stent by Dr. Imogene Burn in August of 2016  . Peripheral arterial occlusive disease (HCC) 11/03/2014  . Precordial chest pain 06/04/2016   Last Assessment & Plan:  My suspicion is that this is likely related to gastroesophageal reflux disease with acute indigestion last evening.  The biomarker elevation is not completely unexpected in this elderly patient with reported cardiomyopathy and current demand ventricular pacing.  Nonetheless she does have a history of extensive vascular disease (though apparently no significant coronary atherosclerosis in 2013) so we Keisean Skowron proceed with a noninvasive risk stratification with pharmacological nuclear stress testing.  Assuming this is a low risk or normal study then I would not advise any invasive cardiac evaluation at this time.  . Preoperative cardiovascular examination 04/02/2015  . Rectal bleeding 11/19/2017   Formatting of this note might be different from the original. 2019  . Renal artery stenosis (HCC) 12/02/2016  . Rheumatoid  arthritis involving multiple sites (HCC) 05/21/2015  . Screening for breast cancer 04/02/2015  . Stroke (HCC)   . Type 2 diabetes mellitus (HCC) 11/10/2010   Last Assessment & Plan:  Formatting of this note might be different from the original. Continue glipizide and add sliding scale insulin coverage Formatting of this note might be different from the original. Per 01/28/2019 Podiatry VN.  Marland Kitchen Vitamin D deficiency 06/20/2016   Past Surgical History:  Procedure Laterality Date  . BIV ICD GENERATOR CHANGEOUT N/A 06/23/2019   Procedure: BIV ICD GENERATOR CHANGEOUT;  Surgeon: Regan Lemming, MD;  Location: [facility] INVASIVE CV LAB;  Service: Cardiovascular;  Laterality: N/A;  . FOOT SURGERY    . pacemaker surgery     a-fib and was done july 2014  . PERIPHERAL VASCULAR CATHETERIZATION N/A 11/05/2014   Procedure: Abdominal Aortogram;  Surgeon: Fransisco Hertz, MD;  Location: [facility] INVASIVE CV LAB;  Service: Cardiovascular;  Laterality: N/A;     Current Outpatient Medications  Medication Sig Dispense Refill  . acetaminophen (TYLENOL) 650 MG CR tablet Take 1,300 mg by mouth in the morning and at bedtime.    Marland Kitchen acyclovir (ZOVIRAX) 400 MG tablet Take 400 mg by mouth 5 (five) times daily.    Marland Kitchen atorvastatin (LIPITOR) 40 MG tablet Take 40 mg by mouth at  bedtime.     . Calcium Carbonate-Vit D-Min (CALCIUM 1200 PO) Take 1 tablet by mouth daily.    . clopidogrel (PLAVIX) 75 MG tablet TAKE ONE TABLET BY MOUTH EVERY DAY 90 tablet 2  . DEXILANT 60 MG capsule Take 60 mg by mouth daily.     . furosemide (LASIX) 80 MG tablet Take 1 tablet (80 mg total) by mouth daily. Take 1 tablet by mouth daily. Take an extra tablet by mouth daily in the evening if you weigh more than 123 lbs. 90 tablet 3  . glipiZIDE (GLUCOTROL) 10 MG tablet Take 10 mg by mouth 2 (two) times daily before a meal.     . isosorbide mononitrate (IMDUR) 60 MG 24 hr tablet Take 1 tablet (60 mg total) by mouth daily. 90 tablet 2  . JANUVIA 25 MG tablet Take 25  mg by mouth daily.    Marland Kitchen ketoconazole (NIZORAL) 2 % shampoo Apply 1 application topically 2 (two) times a week.    . metoprolol succinate (TOPROL-XL) 25 MG 24 hr tablet Take 1 tablet (25 mg total) by mouth daily. 90 tablet 2  . montelukast (SINGULAIR) 10 MG tablet Take 10 mg by mouth at bedtime.    . Multiple Vitamins-Minerals (ZINC PO) Take 1 tablet by mouth daily at 12 noon.    Marland Kitchen NITROSTAT 0.4 MG SL tablet DISSOLVE 1 TABLET UNDER THE TONGUE AS NEEDED FOR CHEST PAIN. 25 tablet 8  . nystatin (MYCOSTATIN) 100000 UNIT/ML suspension Take 5 mLs by mouth 4 (four) times daily.    . potassium chloride (KLOR-CON) 10 MEQ tablet Take 1 tablet (10 mEq total) by mouth daily. 90 tablet 3  . Prodigy Twist Top Lancets 28G MISC by Does not apply route.    . ramipril (ALTACE) 10 MG capsule Take 10 mg by mouth daily.    . sodium chloride (MURO 128) 5 % ophthalmic solution 1 drop 2 (two) times daily.    Marland Kitchen terbinafine (LAMISIL) 250 MG tablet Take 250 mg by mouth daily as needed (Dermatitis on scalp).     . triamcinolone (KENALOG) 0.1 %     . VASCEPA 1 g CAPS Take 2 g by mouth 2 (two) times daily.   3  . Vitamin D, Ergocalciferol, (DRISDOL) 50000 units CAPS capsule Take 50,000 Units by mouth every 14 (fourteen) days.      No current facility-administered medications for this visit.    Allergies:   Codeine, Dulaglutide, Ivp dye [iodinated diagnostic agents], Metformin, Metrizamide, and Pioglitazone   Social History:  The patient  reports that she has never smoked. She has never used smokeless tobacco. She reports that she does not drink alcohol and does not use drugs.   Family History:  The patient's family history includes Alzheimer's disease in her mother; Arthritis in her mother and sister; Bladder Cancer in her father; Heart attack in her maternal grandfather and maternal grandmother; Hyperlipidemia in her father and mother; Vitiligo in her sister.   ROS:  Please see the history of present illness.    Otherwise, review of systems is positive for none.   All other systems are reviewed and negative.   PHYSICAL EXAM: VS:  BP (!) 136/56   Pulse 86   Ht 5\' 2"  (1.575 m)   Wt 124 lb (56.2 kg)   SpO2 97%   BMI 22.68 kg/m  , BMI Body mass index is 22.68 kg/m. GEN: Well nourished, well developed, in no acute distress  HEENT: normal  Neck: no JVD, carotid  bruits, or masses Cardiac: RRR; no murmurs, rubs, or gallops,no edema  Respiratory:  clear to auscultation bilaterally, normal work of breathing GI: soft, nontender, nondistended, + BS MS: no deformity or atrophy  Skin: warm and dry, device site well healed Neuro:  Strength and sensation are intact Psych: euthymic mood, full affect  EKG:  EKG is not ordered today. Personal review of the ekg ordered 04/13/20 shows sinus rhythm, ventricular paced.  Personal review of the device interrogation today. Results in [facility]   Recent Labs: 06/23/2019: BUN 18; Creatinine, Ser 1.17; Hemoglobin 11.6; Platelets 152; Potassium 3.4; Sodium 140    Lipid Panel  No results found for: CHOL, TRIG, HDL, CHOLHDL, VLDL, LDLCALC, LDLDIRECT   Wt Readings from Last 3 Encounters:  05/31/20 124 lb (56.2 kg)  04/13/20 121 lb (54.9 kg)  12/04/19 120 lb (54.4 kg)      Other studies Reviewed: Additional studies/ records that were reviewed today include: TTE 05/22/2017 Review of the above records today demonstrates:  - Left ventricle: The cavity size was normal. Systolic function was   normal. The estimated ejection fraction was in the range of 60%   to 65%. Wall motion was normal; there were no regional wall   motion abnormalities. - Aortic valve: There was mild regurgitation. Valve area (VTI):   1.83 cm^2. Valve area (Vmax): 1.72 cm^2. Valve area (Vmean): 1.71   cm^2.   ASSESSMENT AND PLAN:  1.  Chronic systolic heart failure: Status post Saint Jude CRT-D.  Device without issue.  Currently on optimal medical therapy.  Device is functioning  appropriately.  No changes at this time.  2.  Hypertension: Currently well controlled  3.  Hyperlipidemia: Continue atorvastatin  4.  Coronary artery disease: Currently on Plavix.  No chest pain.  Continue current management.   Current medicines are reviewed at length with the patient today.   The patient does not have concerns regarding her medicines.  The following changes were made today: None  Labs/ tests ordered today include:  No orders of the defined types were placed in this encounter.    Disposition:   FU with Harrietta Incorvaia 12 months  Signed, Cynthea Zachman [name], MD  05/31/2020 11:57 AM     [facility] [address] [place] [address] [phone] (office) [phone] (fax)

## 2020-06-08 ENCOUNTER — Other Ambulatory Visit: Payer: Self-pay

## 2020-06-08 ENCOUNTER — Encounter: Payer: Self-pay | Admitting: Cardiology

## 2020-06-08 ENCOUNTER — Ambulatory Visit: Payer: Medicare PPO | Admitting: Cardiology

## 2020-06-08 VITALS — BP 110/60 | HR 80 | Ht 62.0 in | Wt 123.0 lb

## 2020-06-08 DIAGNOSIS — I739 Peripheral vascular disease, unspecified: Secondary | ICD-10-CM

## 2020-06-08 DIAGNOSIS — I5042 Chronic combined systolic (congestive) and diastolic (congestive) heart failure: Secondary | ICD-10-CM | POA: Diagnosis not present

## 2020-06-08 DIAGNOSIS — E785 Hyperlipidemia, unspecified: Secondary | ICD-10-CM

## 2020-06-08 DIAGNOSIS — N183 Chronic kidney disease, stage 3 unspecified: Secondary | ICD-10-CM

## 2020-06-08 DIAGNOSIS — Z95810 Presence of automatic (implantable) cardiac defibrillator: Secondary | ICD-10-CM | POA: Diagnosis not present

## 2020-06-08 DIAGNOSIS — I130 Hypertensive heart and chronic kidney disease with heart failure and stage 1 through stage 4 chronic kidney disease, or unspecified chronic kidney disease: Secondary | ICD-10-CM | POA: Diagnosis not present

## 2020-06-08 NOTE — Progress Notes (Signed)
Cardiology Office Note:    Date:  06/08/2020   ID:  [name], DOB 1934/08/17, MRN [id number]  PCP:  Olive Bass, MD  Cardiologist:  Norman Herrlich, MD    Referring MD: Olive Bass, MD    ASSESSMENT:    1. Biventricular ICD (implantable cardioverter-defibrillator) in place   2. Chronic combined systolic and diastolic heart failure (HCC)   3. Hypertensive heart and kidney disease with heart failure and with chronic kidney disease stage III (HCC)   4. Dyslipidemia   5. Stage 3 chronic kidney disease, unspecified whether stage 3a or 3b CKD (HCC)   6. PAD (peripheral artery disease) (HCC)    PLAN:    In order of problems listed above:  1. Overall she has done well with her cardiomyopathy guideline directed therapy and CRT, EF normalized she is appropriately paced 100% of the time biventricular heart failure is compensated and EF is recovered.  Continue her current medical regimen including beta-blocker ACE inhibitor and her diuretic. 2. BP at target on current treatment 3. Continue treatment first lipids including a atorvastatin along with fish oil she is pending follow-up labs in the next month or 2 with her PCP if triglycerides remain severely elevated transition to icosapent ethyl would be appropriate 4. Renal function improved on recent labs 5. Stable she will have her vascular studies performed to my office the orders are released   Next appointment: 6 months   Medication Adjustments/Labs and Tests Ordered: Current medicines are reviewed at length with the patient today.  Concerns regarding medicines are outlined above.  Orders Placed This Encounter  Procedures  . VAS US AORTA/IVC/ILIACS  . VAS US CAROTID   No orders of the defined types were placed in this encounter.   No chief complaint on file.   History of Present Illness:    Shari [name] is a 86 y.o. female with a hx of heart failure with a nonischemic cardiomyopathy due to left bundle branch block  subsequent CRT-D therapy with normalization of ejection fraction, mild CAD hypertensive heart disease with CKD stage III dyslipidemia peripheral arterial disease with previous PCI and stent left common iliac artery last seen on 12/04/2019  Compliance with diet, lifestyle and medications: Yes  She is pending vascular studies will be sure to get ordered today. She is having no claudication or TIA symptoms. She is increasingly concerned about caring for her husband at home with his functional limitations and dementia. Her recent device check shows she is ventricularly paced 100% of the time that is the goal with CRT. She is not having symptoms of cardiomyopathy with edema shortness of breath orthopnea chest pain or syncope but she just does not have the strength and endurance that she used to have.  Vascular studies show mild lower extremity arterial disease ABI on the right 0.94 on the left 0.88 Device check 03/23/2020 showed normal device parameters lead function and she is ventricularly paced 100% of the time atrially paced 1% of the time.  Recent labs 03/09/2020 showed GFR 54 cc creatinine 0.98 potassium 3.8 cholesterol 147 LDL 45 triglycerides 300 HDL 37 Past Medical History:  Diagnosis Date  . Anxiety   . Automatic implantable cardioverter-defibrillator in situ 10/25/2014   Overview:  Overview:  dual chamber ICD,upgraded to CRT with LBBB and nonischemic cardiomyopathy  . Biventricular ICD (implantable cardioverter-defibrillator) in place 10/25/2014   Overview:  BIV -ICD,upgraded to CRT with LBBB and nonischemic cardiomyopathy  . Chronic combined systolic and diastolic heart  failure (HCC) 10/25/2014   Overview:  EF 77% by MUGA 06/15/15  Formatting of this note might be different from the original. Overview:  EF 66% by MUGA 03/12/14  Overview:  Overview:  EF 77% by MUGA 06/15/15  Overview:  EF 66% by MUGA 03/12/14  Overview:  Overview:  EF 77% by MUGA 06/15/15 Formatting of this note might be  different from the original. EF 66% by MUGA 03/12/14  . Chronic coronary artery disease 10/25/2014  . Chronic kidney disease, stage 3 (HCC) 12/05/2016  . Combined systolic and diastolic congestive heart failure (HCC) 10/25/2014   Last Assessment & Plan:  Formatting of this note might be different from the original. Type and degree unknown though patient currently appears to be euvolemic.  Marland Kitchen Coronary artery disease 10/25/2014  . Costochondritis 10/25/2014  . Diabetes mellitus 2 years  . Diverticulitis   . Dizziness 06/30/2018   Last Assessment & Plan:  Formatting of this note might be different from the original. 86 yo F with history of CHF, DM, PVD, h/o TIA with double vision in 2013, HTN, HLD.   Yesterday morning she reports that when she woke up and looked over at her clock it looked blurry.  She denies double vision, facial droop or extremity weakness.  When she tried to get up to ambulate she reports that she felt d  . Dysuria 06/15/2017  . Elevated troponin 06/04/2016   Last Assessment & Plan:  Likely may be related to ventricular pacing and underlying cardiomyopathy.  The trajectory of the biomarker elevation and neither is her clinical history suggestive of acute coronary syndrome.  Further stratification with nuclear stress testing  . Essential hypertension 06/04/2016   Last Assessment & Plan:  Formatting of this note might be different from the original. Continue carvedilol  . Gastroesophageal reflux disease 11/10/2010   Last Assessment & Plan:  Formatting of this note might be different from the original. Continue Dexilant.  Marland Kitchen GERD (gastroesophageal reflux disease)   . Glaucoma   . Gout 12/02/2016  . High cholesterol   . History of ischemic stroke 06/30/2018  . Hypertension   . Hypertensive heart and kidney disease with heart failure and with chronic kidney disease stage III (HCC) 11/10/2010  . Hypertensive heart disease with heart failure (HCC) 10/25/2014  . Hypokalemia 06/04/2016   Last Assessment &  Plan:  Replace with potassium chloride.  . IBS (irritable bowel syndrome)   . ICD (implantable cardioverter-defibrillator) in place 10/25/2014   Overview:  Overview:  dual chamber ICD,upgraded to CRT with LBBB and nonischemic cardiomyopathy  Formatting of this note might be different from the original. Overview:  dual chamber ICD,upgraded to CRT with LBBB and nonischemic cardiomyopathy  Overview:  Overview:  BIV -ICD,upgraded to CRT with LBBB and nonischemic cardiomyopathy  Arturo Morton, CMA - 09/07/2016 3:00 PM EDT REMOTE MONITORING ASSE  . LBBB (left bundle branch block) 12/02/2016  . 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 Migraine 12/02/2016  . Mild CAD 10/25/2014  . Mixed hyperlipidemia 11/10/2010   Last Assessment & Plan:  Formatting of this note might be different from the original. Continue statin therapy.  . Nephrolithiasis 12/02/2016  . Onychomycosis 11/10/2010  . Osteoarthritis   . PAD (peripheral artery disease) (HCC) 11/03/2014   Overview:  status post insertion of aortic stent and left common iliac stent by Dr. Imogene Burn in August of 2016  . Peripheral arterial occlusive disease (HCC) 11/03/2014  . Precordial chest pain 06/04/2016   Last  Assessment & Plan:  My suspicion is that this is likely related to gastroesophageal reflux disease with acute indigestion last evening.  The biomarker elevation is not completely unexpected in this elderly patient with reported cardiomyopathy and current demand ventricular pacing.  Nonetheless she does have a history of extensive vascular disease (though apparently no significant coronary atherosclerosis in 2013) so we will proceed with a noninvasive risk stratification with pharmacological nuclear stress testing.  Assuming this is a low risk or normal study then I would not advise any invasive cardiac evaluation at this time.  . Preoperative cardiovascular examination 04/02/2015  . Rectal bleeding 11/19/2017   Formatting of this note might  be different from the original. 2019  . Renal artery stenosis (HCC) 12/02/2016  . Rheumatoid arthritis involving multiple sites (HCC) 05/21/2015  . Screening for breast cancer 04/02/2015  . Stroke (HCC)   . Type 2 diabetes mellitus (HCC) 11/10/2010   Last Assessment & Plan:  Formatting of this note might be different from the original. Continue glipizide and add sliding scale insulin coverage Formatting of this note might be different from the original. Per 01/28/2019 Podiatry VN.  Marland Kitchen Vitamin D deficiency 06/20/2016    Past Surgical History:  Procedure Laterality Date  . BIV ICD GENERATOR CHANGEOUT N/A 06/23/2019   Procedure: BIV ICD GENERATOR CHANGEOUT;  Surgeon: Regan Lemming, MD;  Location[facility] INVASIVE CV LAB;  Service: Cardiovascular;  Laterality: N/A;  . FOOT SURGERY    . pacemaker surgery     a-fib and was done july 2014  . PERIPHERAL VASCULAR CATHETERIZATION N/A 11/05/2014   Procedure: Abdominal Aortogram;  Surgeon: Fransisco Hertz, MD;  Location: [facility] INVASIVE CV LAB;  Service: Cardiovascular;  Laterality: N/A;    Current Medications: Current Meds  Medication Sig  . acetaminophen (TYLENOL) 650 MG CR tablet Take 1,300 mg by mouth in the morning and at bedtime.  Marland Kitchen acyclovir (ZOVIRAX) 400 MG tablet Take 400 mg by mouth 5 (five) times daily.  Marland Kitchen atorvastatin (LIPITOR) 40 MG tablet Take 40 mg by mouth at bedtime.   . Calcium Carbonate-Vit D-Min (CALCIUM 1200 PO) Take 1 tablet by mouth daily.  . clopidogrel (PLAVIX) 75 MG tablet TAKE ONE TABLET BY MOUTH EVERY DAY  . DEXILANT 60 MG capsule Take 60 mg by mouth daily.   Tery Sanfilippo Calcium (STOOL SOFTENER PO) Take 200 mg by mouth at bedtime.  . furosemide (LASIX) 80 MG tablet Take 1 tablet (80 mg total) by mouth daily. Take 1 tablet by mouth daily. Take an extra tablet by mouth daily in the evening if you weigh more than 123 lbs.  Marland Kitchen glipiZIDE (GLUCOTROL) 10 MG tablet Take 10 mg by mouth 2 (two) times daily before a meal.   . isosorbide  mononitrate (IMDUR) 60 MG 24 hr tablet Take 1 tablet (60 mg total) by mouth daily.  Marland Kitchen JANUVIA 25 MG tablet Take 25 mg by mouth daily.  Marland Kitchen ketoconazole (NIZORAL) 2 % shampoo Apply 1 application topically 2 (two) times a week.  . metoprolol succinate (TOPROL-XL) 25 MG 24 hr tablet Take 1 tablet (25 mg total) by mouth daily.  . montelukast (SINGULAIR) 10 MG tablet Take 10 mg by mouth at bedtime.  . Multiple Vitamins-Minerals (ZINC PO) Take 1 tablet by mouth daily at 12 noon.  Marland Kitchen NITROSTAT 0.4 MG SL tablet DISSOLVE 1 TABLET UNDER THE TONGUE AS NEEDED FOR CHEST PAIN.  Marland Kitchen nystatin (MYCOSTATIN) 100000 UNIT/ML suspension Take 5 mLs by mouth 4 (four) times daily.  Marland Kitchen  Omega-3 Fatty Acids (FISH OIL) 1000 MG CPDR Take 1,000 mg by mouth at bedtime.  . potassium chloride (KLOR-CON) 10 MEQ tablet Take 1 tablet (10 mEq total) by mouth daily.  . Prodigy Twist Top Lancets 28G MISC by Does not apply route.  . ramipril (ALTACE) 10 MG capsule Take 10 mg by mouth daily.  . sodium chloride (MURO 128) 5 % ophthalmic solution 1 drop 2 (two) times daily.  Marland Kitchen terbinafine (LAMISIL) 250 MG tablet Take 250 mg by mouth daily as needed (Dermatitis on scalp).   . triamcinolone (KENALOG) 0.1 %   . VASCEPA 1 g CAPS Take 2 g by mouth 2 (two) times daily.   . Vitamin D, Ergocalciferol, (DRISDOL) 50000 units CAPS capsule Take 50,000 Units by mouth every 14 (fourteen) days.   . Zinc 50 MG CAPS Take 50 mg by mouth daily.     Allergies:   Codeine, Dulaglutide, Ivp dye [iodinated diagnostic agents], Metformin, Metrizamide, and Pioglitazone   Social History   Socioeconomic History  . Marital status: Married    Spouse name: Not on file  . Number of children: Not on file  . Years of education: Not on file  . Highest education level: Not on file  Occupational History  . Not on file  Tobacco Use  . Smoking status: Never Smoker  . Smokeless tobacco: Never Used  Vaping Use  . Vaping Use: Never used  Substance and Sexual Activity  .  Alcohol use: No    Alcohol/week: 0.0 standard drinks  . Drug use: No  . Sexual activity: Not on file  Other Topics Concern  . Not on file  Social History Narrative  . Not on file   Social Determinants of Health   Financial Resource Strain: Not on file  Food Insecurity: Not on file  Transportation Needs: Not on file  Physical Activity: Not on file  Stress: Not on file  Social Connections: Not on file     Family History: The patient's family history includes Alzheimer's disease in her mother; Arthritis in her mother and sister; Bladder Cancer in her father; Heart attack in her maternal grandfather and maternal grandmother; Hyperlipidemia in her father and mother; Vitiligo in her sister. ROS:   Please see the history of present illness.    All other systems reviewed and are negative.  EKGs/Labs/Other Studies Reviewed:    The following studies were reviewed today:    Recent Labs: 06/23/2019: BUN 18; Creatinine, Ser 1.17; Hemoglobin 11.6; Platelets 152; Potassium 3.4; Sodium 140    Physical Exam:    VS:  BP 110/60   Pulse 80   Ht 5\' 2"  (1.575 m)   Wt 123 lb (55.8 kg)   SpO2 97%   BMI 22.50 kg/m     Wt Readings from Last 3 Encounters:  06/08/20 123 lb (55.8 kg)  05/31/20 124 lb (56.2 kg)  04/13/20 121 lb (54.9 kg)     GEN: She is starting to appear frail well nourished, well developed in no acute distress HEENT: Normal NECK: No JVD; No carotid bruits LYMPHATICS: No lymphadenopathy CARDIAC: RRR, no murmurs, rubs, gallops RESPIRATORY:  Clear to auscultation without rales, wheezing or rhonchi  ABDOMEN: Soft, non-tender, non-distended MUSCULOSKELETAL:  No edema; No deformity  SKIN: Warm and dry NEUROLOGIC:  Alert and oriented x 3 PSYCHIATRIC:  Normal affect    Signed, Norman Herrlich, MD  06/08/2020 3:26 PM    [facility]

## 2020-06-08 NOTE — Patient Instructions (Signed)
Medication Instructions:  Your physician recommends that you continue on your current medications as directed. Please refer to the Current Medication list given to you today.  *If you need a refill on your cardiac medications before your next appointment, please call your pharmacy*   Lab Work: None If you have labs (blood work) drawn today and your tests are completely normal, you will receive your results only by: Marland Kitchen MyChart Message (if you have MyChart) OR . A paper copy in the mail If you have any lab test that is abnormal or we need to change your treatment, we will call you to review the results.   Testing/Procedures: Your physician has requested that you have a carotid duplex. This test is an ultrasound of the carotid arteries in your neck. It looks at blood flow through these arteries that supply the brain with blood. Allow one hour for this exam. There are no restrictions or special instructions.  Your physician has requested that you have an abdominal aorta duplex. During this test, an ultrasound is used to evaluate the aorta. Allow 30 minutes for this exam. Do not eat after midnight the day before and avoid carbonated beverages   Follow-Up: At [facility], you and your health needs are our priority.  As part of our continuing mission to provide you with exceptional heart care, we have created designated Provider Care Teams.  These Care Teams include your primary Cardiologist (physician) and Advanced Practice Providers (APPs -  Physician Assistants and Nurse Practitioners) who all work together to provide you with the care you need, when you need it.  We recommend signing up for the patient portal called "MyChart".  Sign up information is provided on this After Visit Summary.  MyChart is used to connect with patients for Virtual Visits (Telemedicine).  Patients are able to view lab/test results, encounter notes, upcoming appointments, etc.  Non-urgent messages can be sent to your  provider as well.   To learn more about what you can do with MyChart, go to [email or web address].    Your next appointment:   6 month(s)  The format for your next appointment:   In Person  Provider:   Norman Herrlich, MD   Other Instructions

## 2020-06-10 ENCOUNTER — Other Ambulatory Visit: Payer: Self-pay | Admitting: Cardiology

## 2020-06-10 NOTE — Telephone Encounter (Signed)
Potassium CL approved and sent 

## 2020-06-21 ENCOUNTER — Ambulatory Visit: Payer: Medicare PPO

## 2020-06-22 ENCOUNTER — Other Ambulatory Visit: Payer: Self-pay

## 2020-06-22 ENCOUNTER — Ambulatory Visit (INDEPENDENT_AMBULATORY_CARE_PROVIDER_SITE_OTHER): Payer: Medicare PPO

## 2020-06-22 DIAGNOSIS — N183 Chronic kidney disease, stage 3 unspecified: Secondary | ICD-10-CM

## 2020-06-22 DIAGNOSIS — I5042 Chronic combined systolic (congestive) and diastolic (congestive) heart failure: Secondary | ICD-10-CM

## 2020-06-22 DIAGNOSIS — Z95828 Presence of other vascular implants and grafts: Secondary | ICD-10-CM

## 2020-06-22 DIAGNOSIS — I130 Hypertensive heart and chronic kidney disease with heart failure and stage 1 through stage 4 chronic kidney disease, or unspecified chronic kidney disease: Secondary | ICD-10-CM

## 2020-06-22 DIAGNOSIS — Z95810 Presence of automatic (implantable) cardiac defibrillator: Secondary | ICD-10-CM

## 2020-06-22 DIAGNOSIS — E785 Hyperlipidemia, unspecified: Secondary | ICD-10-CM

## 2020-06-22 DIAGNOSIS — I6522 Occlusion and stenosis of left carotid artery: Secondary | ICD-10-CM

## 2020-06-22 NOTE — Progress Notes (Addendum)
Abdominal aorta/IVC/ iliac's duplex exam performed.  Jimmy Maeby Vankleeck RDCS, RVT

## 2020-06-22 NOTE — Progress Notes (Signed)
Carotid duplex exam has been performed.  Jimmy Jacole Capley RDCS, RVT 

## 2020-06-23 ENCOUNTER — Telehealth: Payer: Self-pay

## 2020-06-23 ENCOUNTER — Other Ambulatory Visit: Payer: Self-pay

## 2020-06-23 DIAGNOSIS — I701 Atherosclerosis of renal artery: Secondary | ICD-10-CM

## 2020-06-23 DIAGNOSIS — I739 Peripheral vascular disease, unspecified: Secondary | ICD-10-CM

## 2020-06-23 DIAGNOSIS — I10 Essential (primary) hypertension: Secondary | ICD-10-CM

## 2020-06-23 NOTE — Addendum Note (Signed)
Addended by: Delorse Limber I on: 06/23/2020 08:36 AM   Modules accepted: Orders

## 2020-06-23 NOTE — Telephone Encounter (Signed)
-----   Message from Baldo Daub, MD sent at 06/23/2020  7:35 AM EDT ----- Stable result no severe blockage plan to recheck in 1 year

## 2020-06-23 NOTE — Telephone Encounter (Signed)
Patient called in and states she is having issues with her app on her phone to send a transmission. She states she has bad connection where she lives and its hard for her to send a transmission. Patient will call tech support when she goes outside to see if they can do something to help as they have before

## 2020-06-23 NOTE — Telephone Encounter (Signed)
Spoke with patient regarding results and recommendation.  Patient verbalizes understanding and is agreeable to plan of care. Advised patient to call back with any issues or concerns.  

## 2020-07-03 ENCOUNTER — Other Ambulatory Visit (HOSPITAL_BASED_OUTPATIENT_CLINIC_OR_DEPARTMENT_OTHER): Payer: Medicare PPO

## 2020-07-05 ENCOUNTER — Ambulatory Visit (INDEPENDENT_AMBULATORY_CARE_PROVIDER_SITE_OTHER): Payer: Medicare PPO

## 2020-07-05 DIAGNOSIS — I428 Other cardiomyopathies: Secondary | ICD-10-CM | POA: Diagnosis not present

## 2020-07-07 LAB — CUP PACEART REMOTE DEVICE CHECK
Battery Remaining Longevity: 45 mo
Battery Remaining Percentage: 78 %
Battery Voltage: 2.93 V
Brady Statistic AP VP Percent: 1 %
Brady Statistic AP VS Percent: 1 %
Brady Statistic AS VP Percent: 99 %
Brady Statistic AS VS Percent: 1 %
Brady Statistic RA Percent Paced: 1 %
Date Time Interrogation Session: 20220321074838
HighPow Impedance: 65 Ohm
Implantable Lead Implant Date: 20140702
Implantable Lead Implant Date: 20140702
Implantable Lead Implant Date: 20150730
Implantable Lead Location: 753858
Implantable Lead Location: 753859
Implantable Lead Location: 753860
Implantable Lead Model: 7122
Implantable Pulse Generator Implant Date: 20210322
Lead Channel Impedance Value: 310 Ohm
Lead Channel Impedance Value: 430 Ohm
Lead Channel Impedance Value: 440 Ohm
Lead Channel Pacing Threshold Amplitude: 0.25 V
Lead Channel Pacing Threshold Amplitude: 1 V
Lead Channel Pacing Threshold Amplitude: 2.5 V
Lead Channel Pacing Threshold Pulse Width: 0.4 ms
Lead Channel Pacing Threshold Pulse Width: 0.5 ms
Lead Channel Pacing Threshold Pulse Width: 1.2 ms
Lead Channel Sensing Intrinsic Amplitude: 10.9 mV
Lead Channel Sensing Intrinsic Amplitude: 2.1 mV
Lead Channel Setting Pacing Amplitude: 2 V
Lead Channel Setting Pacing Amplitude: 2.5 V
Lead Channel Setting Pacing Amplitude: 3.25 V
Lead Channel Setting Pacing Pulse Width: 0.4 ms
Lead Channel Setting Pacing Pulse Width: 1.2 ms
Lead Channel Setting Sensing Sensitivity: 0.5 mV
Pulse Gen Serial Number: 810001491

## 2020-07-09 ENCOUNTER — Encounter: Payer: Self-pay | Admitting: Sports Medicine

## 2020-07-09 ENCOUNTER — Ambulatory Visit: Payer: Medicare PPO | Admitting: Sports Medicine

## 2020-07-09 ENCOUNTER — Other Ambulatory Visit: Payer: Self-pay

## 2020-07-09 DIAGNOSIS — I739 Peripheral vascular disease, unspecified: Secondary | ICD-10-CM | POA: Diagnosis not present

## 2020-07-09 DIAGNOSIS — M79675 Pain in left toe(s): Secondary | ICD-10-CM | POA: Diagnosis not present

## 2020-07-09 DIAGNOSIS — B351 Tinea unguium: Secondary | ICD-10-CM

## 2020-07-09 DIAGNOSIS — M79674 Pain in right toe(s): Secondary | ICD-10-CM

## 2020-07-09 DIAGNOSIS — E1140 Type 2 diabetes mellitus with diabetic neuropathy, unspecified: Secondary | ICD-10-CM

## 2020-07-09 NOTE — Progress Notes (Signed)
Subjective: Shari [name] is a 86 y.o. female patient with history of diabetes who returns to office today complaining of callus skin and long, painful nails  while ambulating in shoes; unable to trim.  Patient denies any changes in medication or health history since last encounter except dealing with heart concerns.  FBS not recorded   PCP visit 3 months ago  Patient Active Problem List   Diagnosis Date Noted  . Hypertension   . High cholesterol   . GERD (gastroesophageal reflux disease)   . Anxiety   . Stroke (HCC) 08/07/2019  . Dizziness 06/30/2018  . History of ischemic stroke 06/30/2018  . Rectal bleeding 11/19/2017  . Dysuria 06/15/2017  . Chronic kidney disease, stage 3 (HCC) 12/05/2016  . Gout 12/02/2016  . Migraine 12/02/2016  . Nephrolithiasis 12/02/2016  . LBBB (left bundle branch block) 12/02/2016  . Renal artery stenosis (HCC) 12/02/2016  . Vitamin D deficiency 06/20/2016  . Elevated troponin 06/04/2016  . Hypokalemia 06/04/2016  . Precordial chest pain 06/04/2016  . Essential hypertension 06/04/2016  . Rheumatoid arthritis involving multiple sites (HCC) 05/21/2015  . 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 Screening for breast cancer 04/02/2015  . Preoperative cardiovascular examination 04/02/2015  . PAD (peripheral artery disease) (HCC) 11/03/2014  . Peripheral arterial occlusive disease (HCC) 11/03/2014  . ICD (implantable cardioverter-defibrillator) in place 10/25/2014  . 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 Chronic combined systolic and diastolic heart failure (HCC) 10/25/2014  . Costochondritis 10/25/2014  . Combined systolic and diastolic congestive heart failure (HCC) 10/25/2014  . Coronary artery disease 10/25/2014  . Mild CAD 10/25/2014  . Hypertensive heart disease with heart failure (HCC) 10/25/2014  . Chronic coronary artery disease 10/25/2014  . Automatic implantable  cardioverter-defibrillator in situ 10/25/2014  . Mixed hyperlipidemia 11/10/2010  . Hypertensive heart and kidney disease with heart failure and with chronic kidney disease stage III (HCC) 11/10/2010  . ibs 11/10/2010  . Osteoarthritis 11/10/2010  . Diverticulitis 11/10/2010  . Gastroesophageal reflux disease 11/10/2010  . Glaucoma 11/10/2010  . Type 2 diabetes mellitus (HCC) 11/10/2010  . Onychomycosis 11/10/2010   Current Outpatient Medications on File Prior to Visit  Medication Sig Dispense Refill  . acetaminophen (TYLENOL) 650 MG CR tablet Take 1,300 mg by mouth in the morning and at bedtime.    Marland Kitchen acyclovir (ZOVIRAX) 400 MG tablet Take 400 mg by mouth 5 (five) times daily.    Marland Kitchen atorvastatin (LIPITOR) 40 MG tablet Take 40 mg by mouth at bedtime.     . Calcium Carbonate-Vit D-Min (CALCIUM 1200 PO) Take 1 tablet by mouth daily.    . clopidogrel (PLAVIX) 75 MG tablet TAKE ONE TABLET BY MOUTH EVERY DAY 90 tablet 2  . DEXILANT 60 MG capsule Take 60 mg by mouth daily.     Tery Sanfilippo Calcium (STOOL SOFTENER PO) Take 200 mg by mouth at bedtime.    . furosemide (LASIX) 80 MG tablet Take 1 tablet (80 mg total) by mouth daily. Take 1 tablet by mouth daily. Take an extra tablet by mouth daily in the evening if you weigh more than 123 lbs. 90 tablet 3  . glipiZIDE (GLUCOTROL) 10 MG tablet Take 10 mg by mouth 2 (two) times daily before a meal.     . isosorbide mononitrate (IMDUR) 60 MG 24 hr tablet Take 1 tablet (60 mg total) by mouth daily. 90 tablet 2  . JANUVIA 25 MG tablet Take 25 mg by mouth daily.    Marland Kitchen  ketoconazole (NIZORAL) 2 % shampoo Apply 1 application topically 2 (two) times a week.    . metoprolol succinate (TOPROL-XL) 25 MG 24 hr tablet Take 1 tablet (25 mg total) by mouth daily. 90 tablet 2  . montelukast (SINGULAIR) 10 MG tablet Take 10 mg by mouth at bedtime.    . Multiple Vitamins-Minerals (ZINC PO) Take 1 tablet by mouth daily at 12 noon.    Marland Kitchen NITROSTAT 0.4 MG SL tablet DISSOLVE 1  TABLET UNDER THE TONGUE AS NEEDED FOR CHEST PAIN. 25 tablet 8  . nystatin (MYCOSTATIN) 100000 UNIT/ML suspension Take 5 mLs by mouth 4 (four) times daily.    . Omega-3 Fatty Acids (FISH OIL) 1000 MG CPDR Take 1,000 mg by mouth at bedtime.    . potassium chloride (KLOR-CON) 10 MEQ tablet Take 1 tablet (10 mEq total) by mouth daily. 90 tablet 3  . Prodigy Twist Top Lancets 28G MISC by Does not apply route.    . ramipril (ALTACE) 10 MG capsule Take 10 mg by mouth daily.    . sodium chloride (MURO 128) 5 % ophthalmic solution 1 drop 2 (two) times daily.    Marland Kitchen terbinafine (LAMISIL) 250 MG tablet Take 250 mg by mouth daily as needed (Dermatitis on scalp).     . triamcinolone (KENALOG) 0.1 %     . VASCEPA 1 g CAPS Take 2 g by mouth 2 (two) times daily.   3  . Vitamin D, Ergocalciferol, (DRISDOL) 50000 units CAPS capsule Take 50,000 Units by mouth every 14 (fourteen) days.     . Zinc 50 MG CAPS Take 50 mg by mouth daily.     No current facility-administered medications on file prior to visit.   Allergies  Allergen Reactions  . Codeine Diarrhea, Nausea And Vomiting, Other (See Comments) and Hives    All-over body aches, too Other reaction(s): GI intolerance  . Dulaglutide Other (See Comments)    Chest pain, N/V  . Ivp Dye [Iodinated Diagnostic Agents] Nausea And Vomiting and Other (See Comments)  . Metformin Other (See Comments)  . Metrizamide Nausea And Vomiting and Other (See Comments)  . Pioglitazone Other (See Comments)    Has CHF    Recent Results (from the past 2160 hour(s))  CUP PACEART REMOTE DEVICE CHECK     Status: None   Collection Time: 06/21/20  7:48 AM  Result Value Ref Range   Date Time Interrogation Session [id number]    Pulse Generator Manufacturer OTHER    Pulse Gen Model [id number] Gallant HF    Pulse Gen Serial Number [id number]    Clinic Name [facility]    Implantable Pulse Generator Type Cardiac Resynch Therapy Defibulator    Implantable Pulse Generator  Implant Date [id number]    Implantable Lead Manufacturer [facility]    Implantable Lead Model 1458Q Quartet    Implantable Lead Serial Number [id number]    Implantable Lead Implant Date [id number]    Implantable Lead Location Detail 1 UNKNOWN    Implantable Lead Location [id number]    Implantable Lead Manufacturer [facility]    Implantable Lead Model [id number] Tendril STS    Implantable Lead Serial Number [id number]    Implantable Lead Implant Date [id number]    Implantable Lead Location Detail 1 UNKNOWN    Implantable Lead Location [id number]    Implantable Lead Manufacturer [facility]    Implantable Lead Model C1931474 Durata    Implantable Lead Serial Number [id number]    Implantable Lead Implant Date [id number]    Implantable Lead  Location Detail 1 UNKNOWN    Implantable Lead Location [phone]    Lead Channel Setting Sensing Sensitivity 0.5 mV   Lead Channel Setting Sensing Adaptation Mode Adaptive Sensing    Lead Channel Setting Pacing Amplitude 2.0 V   Lead Channel Setting Pacing Pulse Width 0.4 ms   Lead Channel Setting Pacing Amplitude 2.5 V   Lead Channel Setting Pacing Pulse Width 1.2 ms   Lead Channel Setting Pacing Amplitude 3.25 V   Lead Channel Setting Pacing Capture Mode Fixed Pacing    Lead Channel Status NULL    Lead Channel Impedance Value 440 ohm   Lead Channel Pacing Threshold Amplitude 2.5 V   Lead Channel Pacing Threshold Pulse Width 1.2 ms   Lead Channel Status NULL    Lead Channel Impedance Value 430 ohm   Lead Channel Sensing Intrinsic Amplitude 2.1 mV   Lead Channel Pacing Threshold Amplitude 0.25 V   Lead Channel Pacing Threshold Pulse Width 0.5 ms   Lead Channel Status NULL    Lead Channel Impedance Value 310 ohm   Lead Channel Sensing Intrinsic Amplitude 10.9 mV   Lead Channel Pacing Threshold Amplitude 1.0 V   Lead Channel Pacing Threshold Pulse Width 0.4 ms   HighPow Impedance 65 ohm   HighPow Imped Status NULL    Battery Status MOS    Battery Remaining Longevity 45 mo   Battery  Remaining Percentage 78.0 %   Battery Voltage 2.93 V   Brady Statistic RA Percent Paced 1.0 %   Brady Statistic AP VP Percent 1.0 %   Brady Statistic AS VP Percent 99.0 %   Brady Statistic AP VS Percent 1.0 %   Brady Statistic AS VS Percent 1.0 %    Objective: General: Patient is awake, alert, and oriented x 3 and in no acute distress.  Integument: Skin is warm, dry and supple bilateral. Nails are tender, long, thickened and dystrophic with subungual debris, consistent with onychomycosis, 2-5 bilateral. There are no nails on bilateral hallux, or very limited nail due to previous nail procedures. No open lesions present bilateral. Remaining integument unremarkable.  Vasculature: Dorsalis Pedis pulse 1/4 bilateral. Posterior Tibial pulse 0/4 bilateral due to trace edema at ankles. Capillary fill time <5 sec 1-5 bilateral. Scant hair growth to the level of the digits.Temperature gradient within normal limits. Mild varicosities present bilateral.  Neurology: The patient has slightly diminished sensation measured with a 5.07/10g Semmes Weinstein Monofilament at all pedal sites bilateral .  Musculoskeletal: Asymptomatic mild hammertoes pedal deformities noted bilateral. Muscular strength 5/5 in all lower extremity muscular groups bilateral without pain on range of motion . No tenderness with calf compression bilateral.  Assessment and Plan: Problem List Items Addressed This Visit      Cardiovascular and Mediastinum   PAD (peripheral artery disease) (HCC)    Other Visit Diagnoses    Pain due to onychomycosis of toenails of both feet    -  Primary   Type 2 diabetes, controlled, with neuropathy (HCC)         -Examined patient. -Re-Discussed and educated patient daily foot inspection in the setting of diabetes  -Mechanically debrided all nails bilateral using sterile nail nipper and filed with dremel without incident & smoothed callus2 on right foot using rotary bur without incident.    -Return in 3 months -Patient advised to call the office if any problems or questions arise in the meantime.  Asencion Islam, DPM

## 2020-07-10 ENCOUNTER — Ambulatory Visit (HOSPITAL_BASED_OUTPATIENT_CLINIC_OR_DEPARTMENT_OTHER): Admission: RE | Admit: 2020-07-10 | Payer: Medicare PPO | Source: Ambulatory Visit

## 2020-07-15 NOTE — Progress Notes (Signed)
Remote ICD transmission.   

## 2020-08-03 ENCOUNTER — Other Ambulatory Visit: Payer: Self-pay | Admitting: Cardiology

## 2020-08-03 NOTE — Telephone Encounter (Signed)
Rx refill sent to pharmacy. 

## 2020-08-11 ENCOUNTER — Ambulatory Visit (HOSPITAL_COMMUNITY)
Admission: RE | Admit: 2020-08-11 | Discharge: 2020-08-11 | Disposition: A | Discharge status: Home / Self Care | Payer: Medicare PPO | Source: Ambulatory Visit | Attending: Cardiology | Admitting: Cardiology

## 2020-08-11 ENCOUNTER — Ambulatory Visit (HOSPITAL_COMMUNITY)
Admission: RE | Admit: 2020-08-11 | Discharge: 2020-08-11 | Disposition: A | Discharge status: Home / Self Care | Payer: Medicare PPO | Source: Ambulatory Visit | Attending: Physician Assistant | Admitting: Physician Assistant

## 2020-08-11 ENCOUNTER — Other Ambulatory Visit: Payer: Self-pay

## 2020-08-11 DIAGNOSIS — I739 Peripheral vascular disease, unspecified: Secondary | ICD-10-CM

## 2020-08-11 DIAGNOSIS — Z950 Presence of cardiac pacemaker: Secondary | ICD-10-CM | POA: Insufficient documentation

## 2020-08-11 DIAGNOSIS — I701 Atherosclerosis of renal artery: Secondary | ICD-10-CM | POA: Diagnosis present

## 2020-08-11 MED ORDER — GADOBUTROL 1 MMOL/ML IV SOLN
6.0000 mL | Freq: Once | INTRAVENOUS | Status: AC | PRN
  Administered 2020-08-11: 6 mL via INTRAVENOUS

## 2020-08-11 NOTE — Progress Notes (Signed)
[facility] today [facility] for [facility] abdomen w/wo. Shari [name]- [employer] rep at the bedside to perform pacemaker/ICD device check. Orders received for DOO 85, RV only, single coil. Will re-program once scan is completed.

## 2020-08-12 ENCOUNTER — Telehealth: Payer: Self-pay

## 2020-08-12 NOTE — Telephone Encounter (Signed)
-----   Message from Baldo Daub, MD sent at 08/12/2020 11:57 AM EDT ----- I am not sure that I have ordered this is lumbar angiogram however it has my name on it.  The stents are open I think this is a good result

## 2020-08-12 NOTE — Telephone Encounter (Signed)
Spoke with patient regarding results and recommendation.  Patient verbalizes understanding and is agreeable to plan of care. Advised patient to call back with any issues or concerns.  

## 2020-09-08 DIAGNOSIS — N3941 Urge incontinence: Secondary | ICD-10-CM | POA: Insufficient documentation

## 2020-09-08 HISTORY — DX: Urge incontinence: N39.41

## 2020-09-20 ENCOUNTER — Ambulatory Visit: Payer: Medicare PPO

## 2020-10-11 LAB — CUP PACEART REMOTE DEVICE CHECK
Battery Remaining Longevity: 43 mo
Battery Remaining Percentage: 73 %
Battery Voltage: 2.93 V
Brady Statistic AP VP Percent: 1 %
Brady Statistic AP VS Percent: 1 %
Brady Statistic AS VP Percent: 99 %
Brady Statistic AS VS Percent: 1 %
Brady Statistic RA Percent Paced: 1 %
Date Time Interrogation Session: 20220620104742
HighPow Impedance: 60 Ohm
Implantable Lead Implant Date: 20140702
Implantable Lead Implant Date: 20140702
Implantable Lead Implant Date: 20150730
Implantable Lead Location: 753858
Implantable Lead Location: 753859
Implantable Lead Location: 753860
Implantable Lead Model: 7122
Implantable Pulse Generator Implant Date: 20210322
Lead Channel Impedance Value: 310 Ohm
Lead Channel Impedance Value: 440 Ohm
Lead Channel Impedance Value: 450 Ohm
Lead Channel Pacing Threshold Amplitude: 0.75 V
Lead Channel Pacing Threshold Amplitude: 1 V
Lead Channel Pacing Threshold Amplitude: 2.75 V
Lead Channel Pacing Threshold Pulse Width: 0.4 ms
Lead Channel Pacing Threshold Pulse Width: 0.5 ms
Lead Channel Pacing Threshold Pulse Width: 1.2 ms
Lead Channel Sensing Intrinsic Amplitude: 1.7 mV
Lead Channel Sensing Intrinsic Amplitude: 12 mV
Lead Channel Setting Pacing Amplitude: 2 V
Lead Channel Setting Pacing Amplitude: 2.5 V
Lead Channel Setting Pacing Amplitude: 3.25 V
Lead Channel Setting Pacing Pulse Width: 0.4 ms
Lead Channel Setting Pacing Pulse Width: 1.2 ms
Lead Channel Setting Sensing Sensitivity: 0.5 mV
Pulse Gen Serial Number: 810001491

## 2020-10-15 ENCOUNTER — Other Ambulatory Visit: Payer: Self-pay

## 2020-10-15 ENCOUNTER — Encounter: Payer: Self-pay | Admitting: Sports Medicine

## 2020-10-15 ENCOUNTER — Ambulatory Visit: Payer: Medicare PPO | Admitting: Sports Medicine

## 2020-10-15 DIAGNOSIS — E1140 Type 2 diabetes mellitus with diabetic neuropathy, unspecified: Secondary | ICD-10-CM

## 2020-10-15 DIAGNOSIS — M79675 Pain in left toe(s): Secondary | ICD-10-CM

## 2020-10-15 DIAGNOSIS — M79674 Pain in right toe(s): Secondary | ICD-10-CM

## 2020-10-15 DIAGNOSIS — B351 Tinea unguium: Secondary | ICD-10-CM

## 2020-10-15 DIAGNOSIS — I739 Peripheral vascular disease, unspecified: Secondary | ICD-10-CM

## 2020-10-15 NOTE — Progress Notes (Signed)
Subjective: Shari [name] is a 86 y.o. female patient with history of diabetes who returns to office today complaining of callus skin and long, painful nails  while ambulating in shoes; unable to trim.  Patient denies any changes in medication or health history since last encounter.   FBS not recorded   PCP visit 3 months ago  Patient Active Problem List   Diagnosis Date Noted   Hypertension    High cholesterol    GERD (gastroesophageal reflux disease)    Anxiety    Stroke (HCC) 08/07/2019   Dizziness 06/30/2018   History of ischemic stroke 06/30/2018   Rectal bleeding 11/19/2017   Dysuria 06/15/2017   Chronic kidney disease, stage 3 (HCC) 12/05/2016   Gout 12/02/2016   Migraine 12/02/2016   Nephrolithiasis 12/02/2016   LBBB (left bundle branch block) 12/02/2016   Renal artery stenosis (HCC) 12/02/2016   Vitamin D deficiency 06/20/2016   Elevated troponin 06/04/2016   Hypokalemia 06/04/2016   Precordial chest pain 06/04/2016   Essential hypertension 06/04/2016   Rheumatoid arthritis involving multiple sites (HCC) 05/21/2015   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) 05/21/2015   Screening for breast cancer 04/02/2015   Preoperative cardiovascular examination 04/02/2015   PAD (peripheral artery disease) (HCC) 11/03/2014   Peripheral arterial occlusive disease (HCC) 11/03/2014   ICD (implantable cardioverter-defibrillator) in place 10/25/2014   Biventricular ICD (implantable cardioverter-defibrillator) in place 10/25/2014   Chronic combined systolic and diastolic heart failure (HCC) 10/25/2014   Costochondritis 10/25/2014   Combined systolic and diastolic congestive heart failure (HCC) 10/25/2014   Coronary artery disease 10/25/2014   Mild CAD 10/25/2014   Hypertensive heart disease with heart failure (HCC) 10/25/2014   Chronic coronary artery disease 10/25/2014   Automatic implantable cardioverter-defibrillator in situ 10/25/2014   Mixed hyperlipidemia  11/10/2010   Hypertensive heart and kidney disease with heart failure and with chronic kidney disease stage III (HCC) 11/10/2010   ibs 11/10/2010   Osteoarthritis 11/10/2010   Diverticulitis 11/10/2010   Gastroesophageal reflux disease 11/10/2010   Glaucoma 11/10/2010   Type 2 diabetes mellitus (HCC) 11/10/2010   Onychomycosis 11/10/2010   Current Outpatient Medications on File Prior to Visit  Medication Sig Dispense Refill   acetaminophen (TYLENOL) 650 MG CR tablet Take 1,300 mg by mouth in the morning and at bedtime.     acyclovir (ZOVIRAX) 400 MG tablet Take 400 mg by mouth 5 (five) times daily.     atorvastatin (LIPITOR) 40 MG tablet Take 40 mg by mouth at bedtime.      Calcium Carbonate-Vit D-Min (CALCIUM 1200 PO) Take 1 tablet by mouth daily.     clopidogrel (PLAVIX) 75 MG tablet TAKE ONE TABLET BY MOUTH EVERY DAY 90 tablet 2   DEXILANT 60 MG capsule Take 60 mg by mouth daily.      Docusate Calcium (STOOL SOFTENER PO) Take 200 mg by mouth at bedtime.     furosemide (LASIX) 80 MG tablet Take 1 tablet (80 mg total) by mouth daily. Take 1 tablet by mouth daily. Take an extra tablet by mouth daily in the evening if you weigh more than 123 lbs. 90 tablet 3   glipiZIDE (GLUCOTROL) 10 MG tablet Take 10 mg by mouth 2 (two) times daily before a meal.      isosorbide mononitrate (IMDUR) 60 MG 24 hr tablet Take 1 tablet (60 mg total) by mouth daily. 90 tablet 2   JANUVIA 25 MG tablet Take 25 mg by mouth daily.  ketoconazole (NIZORAL) 2 % shampoo Apply 1 application topically 2 (two) times a week.     metoprolol succinate (TOPROL-XL) 25 MG 24 hr tablet TAKE ONE TABLET BY MOUTH DAILY 90 tablet 2   montelukast (SINGULAIR) 10 MG tablet Take 10 mg by mouth at bedtime.     Multiple Vitamins-Minerals (ZINC PO) Take 1 tablet by mouth daily at 12 noon.     NITROSTAT 0.4 MG SL tablet DISSOLVE 1 TABLET UNDER THE TONGUE AS NEEDED FOR CHEST PAIN. 25 tablet 8   nystatin (MYCOSTATIN) 100000 UNIT/ML  suspension Take 5 mLs by mouth 4 (four) times daily.     Omega-3 Fatty Acids (FISH OIL) 1000 MG CPDR Take 1,000 mg by mouth at bedtime.     potassium chloride (KLOR-CON) 10 MEQ tablet Take 1 tablet (10 mEq total) by mouth daily. 90 tablet 3   Prodigy Twist Top Lancets 28G MISC by Does not apply route.     ramipril (ALTACE) 10 MG capsule Take 10 mg by mouth daily.     sodium chloride (MURO 128) 5 % ophthalmic solution 1 drop 2 (two) times daily.     terbinafine (LAMISIL) 250 MG tablet Take 250 mg by mouth daily as needed (Dermatitis on scalp).      triamcinolone (KENALOG) 0.1 %      VASCEPA 1 g CAPS Take 2 g by mouth 2 (two) times daily.   3   Vitamin D, Ergocalciferol, (DRISDOL) 50000 units CAPS capsule Take 50,000 Units by mouth every 14 (fourteen) days.      Zinc 50 MG CAPS Take 50 mg by mouth daily.     No current facility-administered medications on file prior to visit.   Allergies  Allergen Reactions   Codeine Diarrhea, Nausea And Vomiting, Other (See Comments) and Hives    All-over body aches, too Other reaction(s): GI intolerance   Dulaglutide Other (See Comments)    Chest pain, N/V   Ivp Dye [Iodinated Diagnostic Agents] Nausea And Vomiting and Other (See Comments)   Metformin Other (See Comments)   Metrizamide Nausea And Vomiting and Other (See Comments)   Pioglitazone Other (See Comments)    Has CHF    Recent Results (from the past 2160 hour(s))  CUP PACEART REMOTE DEVICE CHECK     Status: None   Collection Time: 09/20/20 10:47 AM  Result Value Ref Range   Date Time Interrogation Session [id number]    Pulse Generator Manufacturer OTHER    Pulse Gen Model CDHFA500Q Gallant HF    Pulse Gen Serial Number [id number]    Clinic Name [facility]    Implantable Pulse Generator Type Cardiac Resynch Therapy Defibulator    Implantable Pulse Generator Implant Date [id number]    Implantable Lead Manufacturer [facility]    Implantable Lead Model 1458Q Quartet    Implantable Lead  Serial Number [id number]    Implantable Lead Implant Date [id number]    Implantable Lead Location Detail 1 UNKNOWN    Implantable Lead Location [id number]    Implantable Lead Manufacturer [facility]    Implantable Lead Model [id number] Tendril STS    Implantable Lead Serial Number [id number]    Implantable Lead Implant Date [id number]    Implantable Lead Location Detail 1 UNKNOWN    Implantable Lead Location [id number]    Implantable Lead Manufacturer [facility]    Implantable Lead Model 7122 Durata    Implantable Lead Serial Number [id number]    Implantable Lead Implant Date 85462703    Implantable Lead Location Detail 1 UNKNOWN  Implantable Lead Location [id number]    Lead Channel Setting Sensing Sensitivity 0.5 mV   Lead Channel Setting Sensing Adaptation Mode Adaptive Sensing    Lead Channel Setting Pacing Amplitude 2.0 V   Lead Channel Setting Pacing Pulse Width 0.4 ms   Lead Channel Setting Pacing Amplitude 2.5 V   Lead Channel Setting Pacing Pulse Width 1.2 ms   Lead Channel Setting Pacing Amplitude 3.25 V   Lead Channel Setting Pacing Capture Mode Fixed Pacing    Lead Channel Status NULL    Lead Channel Impedance Value 450 ohm   Lead Channel Pacing Threshold Amplitude 2.75 V   Lead Channel Pacing Threshold Pulse Width 1.2 ms   Lead Channel Status NULL    Lead Channel Impedance Value 440 ohm   Lead Channel Sensing Intrinsic Amplitude 1.7 mV   Lead Channel Pacing Threshold Amplitude 1.0 V   Lead Channel Pacing Threshold Pulse Width 0.5 ms   Lead Channel Status NULL    Lead Channel Impedance Value 310 ohm   Lead Channel Sensing Intrinsic Amplitude 12.0 mV   Lead Channel Pacing Threshold Amplitude 0.75 V   Lead Channel Pacing Threshold Pulse Width 0.4 ms   HighPow Impedance 60 ohm   HighPow Imped Status NULL    Battery Status MOS    Battery Remaining Longevity 43 mo   Battery Remaining Percentage 73.0 %   Battery Voltage 2.93 V   Brady Statistic RA Percent Paced 1.0 %   Brady Statistic AP VP Percent  1.0 %   Brady Statistic AS VP Percent 99.0 %   Brady Statistic AP VS Percent 1.0 %   Brady Statistic AS VS Percent 1.0 %    Objective: General: Patient is awake, alert, and oriented x 3 and in no acute distress.  Integument: Skin is warm, dry and supple bilateral. Nails are tender, long, thickened and dystrophic with subungual debris, consistent with onychomycosis, 2-5 bilateral. There are no nails on bilateral hallux, or very limited nail due to previous nail procedures. No open lesions present bilateral. Remaining integument unremarkable.  Vasculature: Dorsalis Pedis pulse 1/4 bilateral. Posterior Tibial pulse 0/4 bilateral due to trace edema at ankles. Capillary fill time <5 sec 1-5 bilateral. Scant hair growth to the level of the digits.Temperature gradient within normal limits. Mild varicosities present bilateral.  Neurology: The patient has slightly diminished sensation measured with a 5.07/10g Semmes Weinstein Monofilament at all pedal sites bilateral .  Musculoskeletal: Asymptomatic mild hammertoes pedal deformities noted bilateral. Muscular strength 5/5 in all lower extremity muscular groups bilateral without pain on range of motion . No tenderness with calf compression bilateral.  Assessment and Plan: Problem List Items Addressed This Visit       Cardiovascular and Mediastinum   PAD (peripheral artery disease) (HCC)   Other Visit Diagnoses     Pain due to onychomycosis of toenails of both feet    -  Primary   Type 2 diabetes, controlled, with neuropathy (HCC)          -Examined patient. -Re-Discussed and educated patient daily foot inspection in the setting of diabetes  -Mechanically debrided all nails bilateral using sterile nail nipper and filed with dremel without incident & smoothed callus2 on right foot using rotary bur without incident.   -Return in 3 months -Patient advised to call the office if any problems or questions arise in the meantime.  Asencion Islam,  DPM

## 2020-10-17 ENCOUNTER — Other Ambulatory Visit: Payer: Self-pay | Admitting: Cardiology

## 2020-10-17 DIAGNOSIS — I739 Peripheral vascular disease, unspecified: Secondary | ICD-10-CM

## 2020-10-18 NOTE — Telephone Encounter (Signed)
Clopidogrel 75 mg # 90 X 2 refills sent to [facility] - [place], [place] - [address]

## 2020-12-09 ENCOUNTER — Ambulatory Visit: Payer: Medicare PPO | Admitting: Cardiology

## 2020-12-09 ENCOUNTER — Encounter: Payer: Self-pay | Admitting: Cardiology

## 2020-12-09 ENCOUNTER — Other Ambulatory Visit: Payer: Self-pay

## 2020-12-09 VITALS — BP 114/62 | HR 72 | Ht 62.0 in | Wt 118.6 lb

## 2020-12-09 DIAGNOSIS — I2510 Atherosclerotic heart disease of native coronary artery without angina pectoris: Secondary | ICD-10-CM

## 2020-12-09 DIAGNOSIS — I130 Hypertensive heart and chronic kidney disease with heart failure and stage 1 through stage 4 chronic kidney disease, or unspecified chronic kidney disease: Secondary | ICD-10-CM | POA: Diagnosis not present

## 2020-12-09 DIAGNOSIS — Z95810 Presence of automatic (implantable) cardiac defibrillator: Secondary | ICD-10-CM | POA: Diagnosis not present

## 2020-12-09 DIAGNOSIS — I428 Other cardiomyopathies: Secondary | ICD-10-CM | POA: Diagnosis not present

## 2020-12-09 DIAGNOSIS — E785 Hyperlipidemia, unspecified: Secondary | ICD-10-CM | POA: Diagnosis not present

## 2020-12-09 DIAGNOSIS — N183 Hypertensive heart and chronic kidney disease with heart failure and stage 1 through stage 4 chronic kidney disease, or unspecified chronic kidney disease: Secondary | ICD-10-CM

## 2020-12-09 NOTE — Patient Instructions (Signed)

## 2020-12-09 NOTE — Progress Notes (Signed)
Cardiology Office Note:    Date:  12/09/2020   ID:  Shari [name], DOB Feb 22, 1935, MRN [id number]  PCP:  Olive Bass, MD  Cardiologist:  Norman Herrlich, MD    Referring MD: Olive Bass, MD    ASSESSMENT:    1. Nonischemic cardiomyopathy (HCC)   2. Hypertensive heart and kidney disease with heart failure and with chronic kidney disease stage III (HCC)   3. Biventricular ICD (implantable cardioverter-defibrillator) in place   4. Dyslipidemia   5. Mild CAD    PLAN:    In order of problems listed above:  Overall from a cardiology perspective she has done well with optimization of medical treatment CRT and normalization of ejection fraction.  She is on a good medical regimen including sustained-release metoprolol ARB and her loop diuretic will continue the same she has no evidence of fluid overload and is New York Heart Association class I heart failure. Stable CKD and recent labs Stable device function followed in our clinic Continue current lipid-lowering atorvastatin lipids at target and she has mild nonobstructive CAD and currently is having no angina on medical therapy including statin beta-blocker oral nitrate and her high intensity statin   Next appointment: 6 months   Medication Adjustments/Labs and Tests Ordered: Current medicines are reviewed at length with the patient today.  Concerns regarding medicines are outlined above.  No orders of the defined types were placed in this encounter.  No orders of the defined types were placed in this encounter.   Chief Complaint  Patient presents with   Follow-up  She has multiple cardiac problems including CAD cardiomyopathy heart failure left bundle branch block with CRT therapy hypertension CKD and hyper lipidemia  History of Present Illness:    Shari [name] is a 86 y.o. female with a hx of nonischemic cardiomyopathy due to left bundle branch block subsequent CRT-D therapy with normalization of ejection fraction, mild  CAD hypertensive heart disease with CKD stage III dyslipidemia peripheral arterial disease with previous PCI and stent left common iliac artery   last seen 06/08/2020.  Compliance with diet, lifestyle and medications: Yes  I reviewed her recent labs and device download. She is struggling with her husband's increasing dependency on her He finds all of this in her medical illnesses to make her feel fatigued and has difficulty keeping things like housework. She is not having edema shortness of breath chest pain palpitation or syncope. Most recent labs [facility] [id number] Potassium 4.0 sodium 143 creatinine 1.08 GFR 51 cc/min Cholesterol 134 triglycerides 286 HDL 34 LDL 51 Most recent device check remote 09/20/2020 showed normal device parameters function projected battery life 43 months she is ventricularly paced 100% time and atrially paced 2% of the time CRT Past Medical History:  Diagnosis Date   Anxiety    Automatic implantable cardioverter-defibrillator in situ 10/25/2014   Overview:  Overview:  dual chamber ICD,upgraded to CRT with LBBB and nonischemic cardiomyopathy   Biventricular ICD (implantable cardioverter-defibrillator) in place 10/25/2014   Overview:  BIV -ICD,upgraded to CRT with LBBB and nonischemic cardiomyopathy   Chronic combined systolic and diastolic heart failure (HCC) 10/25/2014   Overview:  EF 77% by MUGA 06/15/15  Formatting of this note might be different from the original. Overview:  EF 66% by MUGA 03/12/14  Overview:  Overview:  EF 77% by MUGA 06/15/15  Overview:  EF 66% by MUGA 03/12/14  Overview:  Overview:  EF 77% by MUGA 06/15/15 Formatting of this note might  be different from the original. EF 66% by MUGA 03/12/14   Chronic coronary artery disease 10/25/2014   Chronic kidney disease, stage 3 (HCC) 12/05/2016   Combined systolic and diastolic congestive heart failure (HCC) 10/25/2014   Last Assessment & Plan:  Formatting of this note might be different from the  original. Type and degree unknown though patient currently appears to be euvolemic.   Coronary artery disease 10/25/2014   Costochondritis 10/25/2014   Diabetes mellitus 2 years   Diverticulitis    Dizziness 06/30/2018   Last Assessment & Plan:  Formatting of this note might be different from the original. 86 yo F with history of CHF, DM, PVD, h/o TIA with double vision in 2013, HTN, HLD.   Yesterday morning she reports that when she woke up and looked over at her clock it looked blurry.  She denies double vision, facial droop or extremity weakness.  When she tried to get up to ambulate she reports that she felt d   Dysuria 06/15/2017   Elevated troponin 06/04/2016   Last Assessment & Plan:  Likely may be related to ventricular pacing and underlying cardiomyopathy.  The trajectory of the biomarker elevation and neither is her clinical history suggestive of acute coronary syndrome.  Further stratification with nuclear stress testing   Essential hypertension 06/04/2016   Last Assessment & Plan:  Formatting of this note might be different from the original. Continue carvedilol   Gastroesophageal reflux disease 11/10/2010   Last Assessment & Plan:  Formatting of this note might be different from the original. Continue Dexilant.   GERD (gastroesophageal reflux disease)    Glaucoma    Gout 12/02/2016   High cholesterol    History of ischemic stroke 06/30/2018   Hypertension    Hypertensive heart and kidney disease with heart failure and with chronic kidney disease stage III (HCC) 11/10/2010   Hypertensive heart disease with heart failure (HCC) 10/25/2014   Hypokalemia 06/04/2016   Last Assessment & Plan:  Replace with potassium chloride.   IBS (irritable bowel syndrome)    ICD (implantable cardioverter-defibrillator) in place 10/25/2014   Overview:  Overview:  dual chamber ICD,upgraded to CRT with LBBB and nonischemic cardiomyopathy  Formatting of this note might be different from the original. Overview:  dual  chamber ICD,upgraded to CRT with LBBB and nonischemic cardiomyopathy  Overview:  Overview:  BIV -ICD,upgraded to CRT with LBBB and nonischemic cardiomyopathy  Arturo Morton, CMA - 09/07/2016 3:00 PM EDT REMOTE MONITORING ASSE   LBBB (left bundle branch block) 12/02/2016   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) 05/21/2015   Migraine 12/02/2016   Mild CAD 10/25/2014   Mixed hyperlipidemia 11/10/2010   Last Assessment & Plan:  Formatting of this note might be different from the original. Continue statin therapy.   Nephrolithiasis 12/02/2016   Onychomycosis 11/10/2010   Osteoarthritis    PAD (peripheral artery disease) (HCC) 11/03/2014   Overview:  status post insertion of aortic stent and left common iliac stent by Dr. Imogene Burn in August of 2016   Peripheral arterial occlusive disease (HCC) 11/03/2014   Precordial chest pain 06/04/2016   Last Assessment & Plan:  My suspicion is that this is likely related to gastroesophageal reflux disease with acute indigestion last evening.  The biomarker elevation is not completely unexpected in this elderly patient with reported cardiomyopathy and current demand ventricular pacing.  Nonetheless she does have a history of extensive vascular disease (though apparently no significant coronary atherosclerosis in 2013) so we will  proceed with a noninvasive risk stratification with pharmacological nuclear stress testing.  Assuming this is a low risk or normal study then I would not advise any invasive cardiac evaluation at this time.   Preoperative cardiovascular examination 04/02/2015   Rectal bleeding 11/19/2017   Formatting of this note might be different from the original. 2019   Renal artery stenosis (HCC) 12/02/2016   Rheumatoid arthritis involving multiple sites (HCC) 05/21/2015   Screening for breast cancer 04/02/2015   Stroke [facility])    Type 2 diabetes mellitus (HCC) 11/10/2010   Last Assessment & Plan:  Formatting of this note might be different from the original.  Continue glipizide and add sliding scale insulin coverage Formatting of this note might be different from the original. Per 01/28/2019 Podiatry VN.   Vitamin D deficiency 06/20/2016    Past Surgical History:  Procedure Laterality Date   BIV ICD GENERATOR CHANGEOUT N/A 06/23/2019   Procedure: BIV ICD GENERATOR CHANGEOUT;  Surgeon: Regan Lemming, MD;  Location: [facility] INVASIVE CV LAB;  Service: Cardiovascular;  Laterality: N/A;   FOOT SURGERY     pacemaker surgery     a-fib and was done july 2014   PERIPHERAL VASCULAR CATHETERIZATION N/A 11/05/2014   Procedure: Abdominal Aortogram;  Surgeon: Fransisco Hertz, MD;  Location: [facility] INVASIVE CV LAB;  Service: Cardiovascular;  Laterality: N/A;    Current Medications: Current Meds  Medication Sig   acetaminophen (TYLENOL) 650 MG CR tablet Take 1,300 mg by mouth in the morning and at bedtime.   acyclovir (ZOVIRAX) 400 MG tablet Take 400 mg by mouth 5 (five) times daily.   atorvastatin (LIPITOR) 40 MG tablet Take 40 mg by mouth at bedtime.    Calcium Carbonate-Vit D-Min (CALCIUM 1200 PO) Take 1 tablet by mouth daily.   clopidogrel (PLAVIX) 75 MG tablet TAKE ONE TABLET BY MOUTH EVERY DAY   DEXILANT 60 MG capsule Take 60 mg by mouth daily.    Docusate Calcium (STOOL SOFTENER PO) Take 200 mg by mouth at bedtime.   esomeprazole (NEXIUM) 40 MG packet Take 40 mg by mouth daily before breakfast.   furosemide (LASIX) 80 MG tablet Take 1 tablet (80 mg total) by mouth daily. Take 1 tablet by mouth daily. Take an extra tablet by mouth daily in the evening if you weigh more than 123 lbs.   glipiZIDE (GLUCOTROL) 10 MG tablet Take 10 mg by mouth 2 (two) times daily before a meal.    isosorbide mononitrate (IMDUR) 60 MG 24 hr tablet Take 1 tablet (60 mg total) by mouth daily.   JANUVIA 25 MG tablet Take 25 mg by mouth daily.   ketoconazole (NIZORAL) 2 % shampoo Apply 1 application topically 2 (two) times a week.   metoprolol succinate (TOPROL-XL) 25 MG 24 hr tablet  TAKE ONE TABLET BY MOUTH DAILY   montelukast (SINGULAIR) 10 MG tablet Take 10 mg by mouth at bedtime.   Multiple Vitamins-Minerals (ZINC PO) Take 1 tablet by mouth daily at 12 noon.   NITROSTAT 0.4 MG SL tablet DISSOLVE 1 TABLET UNDER THE TONGUE AS NEEDED FOR CHEST PAIN.   nystatin (MYCOSTATIN) 100000 UNIT/ML suspension Take 5 mLs by mouth 4 (four) times daily.   Omega-3 Fatty Acids (FISH OIL) 1000 MG CPDR Take 1,000 mg by mouth at bedtime.   potassium chloride (KLOR-CON) 10 MEQ tablet Take 1 tablet (10 mEq total) by mouth daily.   Prodigy Twist Top Lancets 28G MISC by Does not apply route.   ramipril (ALTACE) 10  MG capsule Take 10 mg by mouth daily.   RESTASIS 0.05 % ophthalmic emulsion Place 1 drop into both eyes 2 (two) times daily.   sodium chloride (MURO 128) 5 % ophthalmic solution 1 drop 2 (two) times daily.   terbinafine (LAMISIL) 250 MG tablet Take 250 mg by mouth daily as needed (Dermatitis on scalp).    tolterodine (DETROL LA) 4 MG 24 hr capsule Take 4 mg by mouth daily.   triamcinolone (KENALOG) 0.1 % Apply 1 application topically as needed (dermatitis).   VASCEPA 1 g CAPS Take 2 g by mouth 2 (two) times daily.    Vitamin D, Ergocalciferol, (DRISDOL) 50000 units CAPS capsule Take 50,000 Units by mouth every 14 (fourteen) days.    Zinc 50 MG CAPS Take 50 mg by mouth daily.     Allergies:   Codeine, Dulaglutide, Ivp dye [iodinated diagnostic agents], Metformin, Metrizamide, and Pioglitazone   Social History   Socioeconomic History   Marital status: Married    Spouse name: Not on file   Number of children: Not on file   Years of education: Not on file   Highest education level: Not on file  Occupational History   Not on file  Tobacco Use   Smoking status: Never   Smokeless tobacco: Never  Vaping Use   Vaping Use: Never used  Substance and Sexual Activity   Alcohol use: No    Alcohol/week: 0.0 standard drinks   Drug use: No   Sexual activity: Not on file  Other Topics  Concern   Not on file  Social History Narrative   Not on file   Social Determinants of Health   Financial Resource Strain: Not on file  Food Insecurity: Not on file  Transportation Needs: Not on file  Physical Activity: Not on file  Stress: Not on file  Social Connections: Not on file     Family History: The patient's family history includes Alzheimer's disease in her mother; Arthritis in her mother and sister; Bladder Cancer in her father; Heart attack in her maternal grandfather and maternal grandmother; Hyperlipidemia in her father and mother; Vitiligo in her sister. ROS:   Please see the history of present illness.    All other systems reviewed and are negative.  EKGs/Labs/Other Studies Reviewed:    The following studies were reviewed today:   Physical Exam:    VS:  BP 114/62 (BP Location: Right Arm, Patient Position: Sitting, Cuff Size: Small)   Pulse 72   Ht 5\' 2"  (1.575 m)   Wt 118 lb 9.6 oz (53.8 kg)   SpO2 96%   BMI 21.69 kg/m     Wt Readings from Last 3 Encounters:  12/09/20 118 lb 9.6 oz (53.8 kg)  06/08/20 123 lb (55.8 kg)  05/31/20 124 lb (56.2 kg)     GEN: She appears her age does not look chronically ill well nourished, well developed in no acute distress HEENT: Normal NECK: No JVD; No carotid bruits LYMPHATICS: No lymphadenopathy CARDIAC: RRR, no murmurs, rubs, gallops RESPIRATORY:  Clear to auscultation without rales, wheezing or rhonchi  ABDOMEN: Soft, non-tender, non-distended MUSCULOSKELETAL:  No edema; No deformity  SKIN: Warm and dry NEUROLOGIC:  Alert and oriented x 3 PSYCHIATRIC:  Normal affect    Signed, Norman Herrlich, MD  12/09/2020 4:24 PM    [facility]

## 2020-12-20 DIAGNOSIS — U071 COVID-19: Secondary | ICD-10-CM | POA: Insufficient documentation

## 2020-12-20 HISTORY — DX: COVID-19: U07.1

## 2020-12-22 ENCOUNTER — Ambulatory Visit: Payer: Medicare PPO

## 2021-01-06 LAB — CUP PACEART REMOTE DEVICE CHECK
Battery Remaining Longevity: 40 mo
Battery Remaining Percentage: 68 %
Battery Voltage: 2.93 V
Brady Statistic AP VP Percent: 1 %
Brady Statistic AP VS Percent: 1 %
Brady Statistic AS VP Percent: 99 %
Brady Statistic AS VS Percent: 1 %
Brady Statistic RA Percent Paced: 1 %
Date Time Interrogation Session: 20220922111047
HighPow Impedance: 65 Ohm
Implantable Lead Implant Date: 20140702
Implantable Lead Implant Date: 20140702
Implantable Lead Implant Date: 20150730
Implantable Lead Location: 753858
Implantable Lead Location: 753859
Implantable Lead Location: 753860
Implantable Lead Model: 7122
Implantable Pulse Generator Implant Date: 20210322
Lead Channel Impedance Value: 330 Ohm
Lead Channel Impedance Value: 440 Ohm
Lead Channel Impedance Value: 440 Ohm
Lead Channel Pacing Threshold Amplitude: 0.75 V
Lead Channel Pacing Threshold Amplitude: 1 V
Lead Channel Pacing Threshold Amplitude: 2.75 V
Lead Channel Pacing Threshold Pulse Width: 0.4 ms
Lead Channel Pacing Threshold Pulse Width: 0.5 ms
Lead Channel Pacing Threshold Pulse Width: 1.2 ms
Lead Channel Sensing Intrinsic Amplitude: 1.6 mV
Lead Channel Sensing Intrinsic Amplitude: 12 mV
Lead Channel Setting Pacing Amplitude: 2 V
Lead Channel Setting Pacing Amplitude: 2.5 V
Lead Channel Setting Pacing Amplitude: 3.25 V
Lead Channel Setting Pacing Pulse Width: 0.4 ms
Lead Channel Setting Pacing Pulse Width: 1.2 ms
Lead Channel Setting Sensing Sensitivity: 0.5 mV
Pulse Gen Serial Number: 810001491

## 2021-01-11 ENCOUNTER — Other Ambulatory Visit: Payer: Self-pay | Admitting: Cardiology

## 2021-01-18 ENCOUNTER — Ambulatory Visit: Payer: Medicare PPO | Admitting: Sports Medicine

## 2021-02-04 ENCOUNTER — Ambulatory Visit: Payer: Medicare PPO | Admitting: Sports Medicine

## 2021-02-23 ENCOUNTER — Ambulatory Visit: Payer: Medicare PPO | Admitting: Sports Medicine

## 2021-03-11 ENCOUNTER — Encounter: Payer: Self-pay | Admitting: Sports Medicine

## 2021-03-11 ENCOUNTER — Ambulatory Visit: Payer: Medicare PPO | Admitting: Sports Medicine

## 2021-03-11 DIAGNOSIS — M79675 Pain in left toe(s): Secondary | ICD-10-CM | POA: Diagnosis not present

## 2021-03-11 DIAGNOSIS — M79674 Pain in right toe(s): Secondary | ICD-10-CM

## 2021-03-11 DIAGNOSIS — B351 Tinea unguium: Secondary | ICD-10-CM

## 2021-03-11 DIAGNOSIS — E1140 Type 2 diabetes mellitus with diabetic neuropathy, unspecified: Secondary | ICD-10-CM

## 2021-03-11 DIAGNOSIS — I739 Peripheral vascular disease, unspecified: Secondary | ICD-10-CM

## 2021-03-11 NOTE — Progress Notes (Signed)
Subjective: Shari [name] is a 86 y.o. female patient with history of diabetes who returns to office today complaining of callus skin and long, painful nails  while ambulating in shoes; unable to trim.  Patient denies any changes in medication or health history since last encounter.   FBS not recorded   PCP visit yesterday 03/10/2021  Patient Active Problem List   Diagnosis Date Noted   Hypertension    High cholesterol    GERD (gastroesophageal reflux disease)    Anxiety    Stroke ([place]) 08/07/2019   Dizziness 06/30/2018   History of ischemic stroke 06/30/2018   Rectal bleeding 11/19/2017   Dysuria 06/15/2017   Chronic kidney disease, stage 3 ([place]) 12/05/2016   Gout 12/02/2016   Migraine 12/02/2016   Nephrolithiasis 12/02/2016   LBBB (left bundle branch block) 12/02/2016   Renal artery stenosis (HCC) 12/02/2016   Vitamin D deficiency 06/20/2016   Elevated troponin 06/04/2016   Hypokalemia 06/04/2016   Precordial chest pain 06/04/2016   Essential hypertension 06/04/2016   Rheumatoid arthritis involving multiple sites ([place]) 05/21/2015   Malignant hypertension with congestive heart failure ([place]) 05/21/2015   Screening for breast cancer 04/02/2015   Preoperative cardiovascular examination 04/02/2015   PAD (peripheral artery disease) ([place]) 11/03/2014   Peripheral arterial occlusive disease ([place]) 11/03/2014   ICD (implantable cardioverter-defibrillator) in place 10/25/2014   Biventricular ICD (implantable cardioverter-defibrillator) in place 10/25/2014   Chronic combined systolic and diastolic heart failure ([place]) 10/25/2014   Costochondritis 10/25/2014   Combined systolic and diastolic congestive heart failure ([place]) 10/25/2014   Coronary artery disease 10/25/2014   Mild CAD 10/25/2014   Hypertensive heart disease with heart failure ([place]) 10/25/2014   Chronic coronary artery disease 10/25/2014   Automatic implantable cardioverter-defibrillator in situ 10/25/2014   Mixed  hyperlipidemia 11/10/2010   Hypertensive heart and kidney disease with heart failure and with chronic kidney disease stage III ([place]) 11/10/2010   ibs 11/10/2010   Osteoarthritis 11/10/2010   Diverticulitis 11/10/2010   Gastroesophageal reflux disease 11/10/2010   Glaucoma 11/10/2010   Type 2 diabetes mellitus ([place]) 11/10/2010   Onychomycosis 11/10/2010   Current Outpatient Medications on File Prior to Visit  Medication Sig Dispense Refill   acetaminophen (TYLENOL) 650 MG CR tablet Take 1,300 mg by mouth in the morning and at bedtime.     acyclovir (ZOVIRAX) 400 MG tablet Take 400 mg by mouth 5 (five) times daily.     atorvastatin (LIPITOR) 40 MG tablet Take 40 mg by mouth at bedtime.      Calcium Carbonate-Vit D-Min (CALCIUM 1200 PO) Take 1 tablet by mouth daily.     clopidogrel (PLAVIX) 75 MG tablet TAKE ONE TABLET BY MOUTH EVERY DAY 90 tablet 2   DEXILANT 60 MG capsule Take 60 mg by mouth daily.      Docusate Calcium (STOOL SOFTENER PO) Take 200 mg by mouth at bedtime.     esomeprazole (NEXIUM) 40 MG packet Take 40 mg by mouth daily before breakfast.     furosemide (LASIX) 80 MG tablet Take 1 tablet (80 mg total) by mouth daily. Take 1 tablet by mouth daily. Take an extra tablet by mouth daily in the evening if you weigh more than 123 lbs. 90 tablet 3   glipiZIDE (GLUCOTROL) 10 MG tablet Take 10 mg by mouth 2 (two) times daily before a meal.      isosorbide mononitrate (IMDUR) 60 MG 24 hr tablet Take 1 tablet (60 mg total) by mouth daily. 90 tablet 2  JANUVIA 25 MG tablet Take 25 mg by mouth daily.     ketoconazole (NIZORAL) 2 % shampoo Apply 1 application topically 2 (two) times a week.     metoprolol succinate (TOPROL-XL) 25 MG 24 hr tablet TAKE ONE TABLET BY MOUTH DAILY 90 tablet 2   montelukast (SINGULAIR) 10 MG tablet Take 10 mg by mouth at bedtime.     Multiple Vitamins-Minerals (ZINC PO) Take 1 tablet by mouth daily at 12 noon.     NITROSTAT 0.4 MG SL tablet DISSOLVE 1 TABLET  UNDER THE TONGUE AS NEEDED FOR CHEST PAIN. 25 tablet 8   nystatin (MYCOSTATIN) 100000 UNIT/ML suspension Take 5 mLs by mouth 4 (four) times daily.     Omega-3 Fatty Acids (FISH OIL) 1000 MG CPDR Take 1,000 mg by mouth at bedtime.     potassium chloride (KLOR-CON) 10 MEQ tablet Take 1 tablet (10 mEq total) by mouth daily. 90 tablet 3   Prodigy Twist Top Lancets 28G MISC by Does not apply route.     ramipril (ALTACE) 10 MG capsule Take 10 mg by mouth daily.     RESTASIS 0.05 % ophthalmic emulsion Place 1 drop into both eyes 2 (two) times daily.     sodium chloride (MURO 128) 5 % ophthalmic solution 1 drop 2 (two) times daily.     terbinafine (LAMISIL) 250 MG tablet Take 250 mg by mouth daily as needed (Dermatitis on scalp).      tolterodine (DETROL LA) 4 MG 24 hr capsule Take 4 mg by mouth daily.     triamcinolone (KENALOG) 0.1 % Apply 1 application topically as needed (dermatitis).     VASCEPA 1 g CAPS Take 2 g by mouth 2 (two) times daily.   3   Vitamin D, Ergocalciferol, (DRISDOL) 50000 units CAPS capsule Take 50,000 Units by mouth every 14 (fourteen) days.      Zinc 50 MG CAPS Take 50 mg by mouth daily.     No current facility-administered medications on file prior to visit.   Allergies  Allergen Reactions   Codeine Diarrhea, Nausea And Vomiting, Other (See Comments) and Hives    All-over body aches, too Other reaction(s): GI intolerance   Dulaglutide Other (See Comments)    Chest pain, N/V   Ivp Dye [Iodinated Diagnostic Agents] Nausea And Vomiting and Other (See Comments)   Metformin Other (See Comments)   Metrizamide Nausea And Vomiting and Other (See Comments)   Pioglitazone Other (See Comments)    Has CHF    Recent Results (from the past 2160 hour(s))  CUP PACEART REMOTE DEVICE CHECK     Status: None   Collection Time: 12/23/20 11:10 AM  Result Value Ref Range   Date Time Interrogation Session [id number]    Pulse Generator Manufacturer OTHER    Pulse Gen Model  [id number] Gallant HF    Pulse Gen Serial Number [id number]    Clinic Name [facility]    Implantable Pulse Generator Type Cardiac Resynch Therapy Defibulator    Implantable Pulse Generator Implant Date [id number]    Implantable Lead Manufacturer [facility]    Implantable Lead Model 1458Q Quartet    Implantable Lead Serial Number [id number]    Implantable Lead Implant Date [id number]    Implantable Lead Location Detail 1 UNKNOWN    Implantable Lead Location [id number]    Implantable Lead Manufacturer [facility]    Implantable Lead Model [id number] Tendril STS    Implantable Lead Serial Number [id number]    Implantable Lead Implant Date  FR:360087    Implantable Lead Location Detail 1 UNKNOWN    Implantable Lead Location [phone]    Implantable Lead Manufacturer [facility]    Implantable Lead Model 7122 Durata    Implantable Lead Serial Number [id number]    Implantable Lead Implant Date FR:360087    Implantable Lead Location Detail 1 UNKNOWN    Implantable Lead Location [phone]    Lead Channel Setting Sensing Sensitivity 0.5 mV   Lead Channel Setting Sensing Adaptation Mode Adaptive Sensing    Lead Channel Setting Pacing Amplitude 2.0 V   Lead Channel Setting Pacing Pulse Width 0.4 ms   Lead Channel Setting Pacing Amplitude 2.5 V   Lead Channel Setting Pacing Pulse Width 1.2 ms   Lead Channel Setting Pacing Amplitude 3.25 V   Lead Channel Setting Pacing Capture Mode Fixed Pacing    Lead Channel Status NULL    Lead Channel Impedance Value 440 ohm   Lead Channel Pacing Threshold Amplitude 2.75 V   Lead Channel Pacing Threshold Pulse Width 1.2 ms   Lead Channel Status NULL    Lead Channel Impedance Value 440 ohm   Lead Channel Sensing Intrinsic Amplitude 1.6 mV   Lead Channel Pacing Threshold Amplitude 1.0 V   Lead Channel Pacing Threshold Pulse Width 0.5 ms   Lead Channel Status NULL    Lead Channel Impedance Value 330 ohm   Lead Channel Sensing Intrinsic Amplitude 12.0 mV   Lead Channel Pacing Threshold Amplitude  0.75 V   Lead Channel Pacing Threshold Pulse Width 0.4 ms   HighPow Impedance 65 ohm   HighPow Imped Status NULL    Battery Status MOS    Battery Remaining Longevity 40 mo   Battery Remaining Percentage 68.0 %   Battery Voltage 2.93 V   Brady Statistic RA Percent Paced 1.0 %   Brady Statistic AP VP Percent 1.0 %   Brady Statistic AS VP Percent 99.0 %   Brady Statistic AP VS Percent 1.0 %   Brady Statistic AS VS Percent 1.0 %    Objective: General: Patient is awake, alert, and oriented x 3 and in no acute distress.  Integument: Skin is warm, dry and supple bilateral. Nails are tender, long, thickened and dystrophic with subungual debris, consistent with onychomycosis, 2-5 bilateral. There are no nails on bilateral hallux, or very limited nail due to previous nail procedures.  Very minimal reactive keratosis right greater than left first toe medial aspect x2, no open lesions present bilateral. Remaining integument unremarkable.  Vasculature: Dorsalis Pedis pulse 1/4 bilateral. Posterior Tibial pulse 0/4 bilateral due to trace edema at ankles. Capillary fill time <5 sec 1-5 bilateral. Scant hair growth to the level of the digits.Temperature gradient within normal limits. Mild varicosities present bilateral.  Neurology: The patient has slightly diminished sensation measured with a 5.07/10g Semmes Weinstein Monofilament at all pedal sites bilateral .  Musculoskeletal: Asymptomatic mild bunion and hammertoes pedal deformities noted bilateral. Muscular strength 5/5 in all lower extremity muscular groups bilateral without pain on range of motion . No tenderness with calf compression bilateral.  Assessment and Plan: Problem List Items Addressed This Visit       Cardiovascular and Mediastinum   PAD (peripheral artery disease) ([place])   Other Visit Diagnoses     Pain due to onychomycosis of toenails of both feet    -  Primary   Type 2 diabetes, controlled, with neuropathy ([place])           -Examined patient. -Re-Discussed and educated patient  daily foot inspection in the setting of diabetes  -Mechanically debrided all nails bilateral using sterile nail nipper and filed with dremel without incident & smoothed callus2 on right foot using rotary bur without incident.   -Return in 3 months -Patient advised to call the office if any problems or questions arise in the meantime.  Landis Martins, DPM

## 2021-03-23 ENCOUNTER — Ambulatory Visit: Payer: Medicare PPO

## 2021-04-06 LAB — CUP PACEART REMOTE DEVICE CHECK
Battery Remaining Longevity: 35 mo
Battery Remaining Percentage: 63 %
Battery Voltage: 2.93 V
Brady Statistic AP VP Percent: 1 %
Brady Statistic AP VS Percent: 1 %
Brady Statistic AS VP Percent: 99 %
Brady Statistic AS VS Percent: 1 %
Brady Statistic RA Percent Paced: 1 %
Date Time Interrogation Session: 20221221065420
HighPow Impedance: 59 Ohm
Implantable Lead Implant Date: 20140702
Implantable Lead Implant Date: 20140702
Implantable Lead Implant Date: 20150730
Implantable Lead Location: 753858
Implantable Lead Location: 753859
Implantable Lead Location: 753860
Implantable Lead Model: 7122
Implantable Pulse Generator Implant Date: 20210322
Lead Channel Impedance Value: 330 Ohm
Lead Channel Impedance Value: 430 Ohm
Lead Channel Impedance Value: 430 Ohm
Lead Channel Pacing Threshold Amplitude: 0.75 V
Lead Channel Pacing Threshold Amplitude: 1 V
Lead Channel Pacing Threshold Amplitude: 2.75 V
Lead Channel Pacing Threshold Pulse Width: 0.4 ms
Lead Channel Pacing Threshold Pulse Width: 0.5 ms
Lead Channel Pacing Threshold Pulse Width: 1.2 ms
Lead Channel Sensing Intrinsic Amplitude: 1.7 mV
Lead Channel Sensing Intrinsic Amplitude: 12 mV
Lead Channel Setting Pacing Amplitude: 2 V
Lead Channel Setting Pacing Amplitude: 2.5 V
Lead Channel Setting Pacing Amplitude: 3.25 V
Lead Channel Setting Pacing Pulse Width: 0.4 ms
Lead Channel Setting Pacing Pulse Width: 1.2 ms
Lead Channel Setting Sensing Sensitivity: 0.5 mV
Pulse Gen Serial Number: 810001491

## 2021-04-26 ENCOUNTER — Other Ambulatory Visit: Payer: Self-pay | Admitting: Cardiology

## 2021-05-05 ENCOUNTER — Other Ambulatory Visit: Payer: Self-pay | Admitting: Cardiology

## 2021-05-16 ENCOUNTER — Other Ambulatory Visit: Payer: Self-pay | Admitting: Cardiology

## 2021-05-21 DIAGNOSIS — H26493 Other secondary cataract, bilateral: Secondary | ICD-10-CM

## 2021-05-21 DIAGNOSIS — H04123 Dry eye syndrome of bilateral lacrimal glands: Secondary | ICD-10-CM

## 2021-05-21 DIAGNOSIS — H02883 Meibomian gland dysfunction of right eye, unspecified eyelid: Secondary | ICD-10-CM | POA: Insufficient documentation

## 2021-05-21 DIAGNOSIS — H18513 Endothelial corneal dystrophy, bilateral: Secondary | ICD-10-CM | POA: Insufficient documentation

## 2021-05-21 DIAGNOSIS — Z961 Presence of intraocular lens: Secondary | ICD-10-CM

## 2021-05-21 HISTORY — DX: Dry eye syndrome of bilateral lacrimal glands: H04.123

## 2021-05-21 HISTORY — DX: Meibomian gland dysfunction of right eye, unspecified eyelid: H02.883

## 2021-05-21 HISTORY — DX: Endothelial corneal dystrophy, bilateral: H18.513

## 2021-05-21 HISTORY — DX: Presence of intraocular lens: Z96.1

## 2021-05-21 HISTORY — DX: Other secondary cataract, bilateral: H26.493

## 2021-06-08 ENCOUNTER — Other Ambulatory Visit: Payer: Self-pay

## 2021-06-08 ENCOUNTER — Encounter: Payer: Self-pay | Admitting: Cardiology

## 2021-06-08 ENCOUNTER — Ambulatory Visit: Payer: Medicare PPO | Admitting: Cardiology

## 2021-06-08 VITALS — BP 118/60 | HR 69 | Ht 62.0 in | Wt 119.4 lb

## 2021-06-08 DIAGNOSIS — I2510 Atherosclerotic heart disease of native coronary artery without angina pectoris: Secondary | ICD-10-CM

## 2021-06-08 DIAGNOSIS — I130 Hypertensive heart and chronic kidney disease with heart failure and stage 1 through stage 4 chronic kidney disease, or unspecified chronic kidney disease: Secondary | ICD-10-CM

## 2021-06-08 DIAGNOSIS — Z95810 Presence of automatic (implantable) cardiac defibrillator: Secondary | ICD-10-CM | POA: Diagnosis not present

## 2021-06-08 DIAGNOSIS — N183 Chronic kidney disease, stage 3 unspecified: Secondary | ICD-10-CM

## 2021-06-08 DIAGNOSIS — I428 Other cardiomyopathies: Secondary | ICD-10-CM | POA: Diagnosis not present

## 2021-06-08 DIAGNOSIS — E785 Hyperlipidemia, unspecified: Secondary | ICD-10-CM

## 2021-06-08 NOTE — Progress Notes (Signed)
Cardiology Office Note:    Date:  06/08/2021   ID:  Shari [name], DOB 06/05/1934, MRN [id number]  PCP:  Algis Greenhouse, MD  Cardiologist:  Shirlee More, MD    Referring MD: Algis Greenhouse, MD    ASSESSMENT:    1. Nonischemic cardiomyopathy ([place])   2. Hypertensive heart and kidney disease with heart failure and with chronic kidney disease stage III ([place])   3. Biventricular ICD (implantable cardioverter-defibrillator) in place   4. Mild CAD   5. Dyslipidemia   6. Stage 3 chronic kidney disease, unspecified whether stage 3a or 3b CKD (HCC)    PLAN:    In order of problems listed above:  Overall she is doing well no evidence of decompensated heart failure continue current medical treatment and recheck her echocardiogram. Stable blood pressure at target I reviewed her home blood pressures which are generally in the 1 [id number] systolic range and continue her current loop diuretic.  Stable CKD Stable device function followed in our clinic Having no angina continue medical therapy Lipids are at target continue her statin PAD check carotids aortoiliac duplex   Next appointment: 6 months   Medication Adjustments/Labs and Tests Ordered: Current medicines are reviewed at length with the patient today.  Concerns regarding medicines are outlined above.  No orders of the defined types were placed in this encounter.  No orders of the defined types were placed in this encounter.   Complaint follow-up for CAD heart failure pacemaker and PAD   History of Present Illness:    Shari [name] is a 87 y.o. female with a hx of nonischemic cardiomyopathy due to left bundle branch block subsequent CRT-D therapy with normalization of ejection fraction, mild CAD hypertensive heart disease with CKD stage III dyslipidemia peripheral arterial disease with previous PCI and stent left common iliac artery last seen 12/09/2020.  Compliance with diet, lifestyle and medications: Yes She has several  concerns first if she contemplates corneal surgery at [facility] in August is a low risk procedure in my opinion she is optimized and would do well At times she finds herself breathless in the morning weight stable she has no edema is not having exertional shortness of breath chest pain palpitation or syncope She is due for follow-up in our device clinic Very concerned about her aortoiliac disease and carotids we will schedule duplex in my office so she does not need to travel to [facility] PCP [facility] for 04/29/2021: Sodium 143 potassium 4.1 creatinine 1.08 GFR 51 cc/min LDL 40 H cholesterol 148 triglycerides 339 HDL 32 hemoglobin 12.8 platelets 128,000 Past Medical History:  Diagnosis Date   Anxiety    Automatic implantable cardioverter-defibrillator in situ 10/25/2014   Overview:  Overview:  dual chamber ICD,upgraded to CRT with LBBB and nonischemic cardiomyopathy   Biventricular ICD (implantable 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 Overview:  EF 66% by MUGA 03/12/14  Overview:  Overview:  EF 77% by MUGA 06/15/15  Overview:  EF 66% by MUGA 03/12/14  Overview:  Overview:  EF 77% by MUGA 06/15/15 Formatting of this note might be different from the original. EF 66% by MUGA 03/12/14   Chronic coronary artery disease 10/25/2014   Chronic kidney disease, stage 3 (  [place]) 12/05/2016   Combined systolic and diastolic congestive heart failure ([place]) 10/25/2014   Last Assessment & Plan:  Formatting of this note might be different from the original. Type and degree unknown though patient currently appears to be euvolemic.   Coronary artery disease 10/25/2014   Costochondritis 10/25/2014   Diabetes mellitus 2 years   Diverticulitis     Dizziness 06/30/2018   Last Assessment & Plan:  Formatting of this note might be different from the original. 87 yo F with history of CHF, DM, PVD, h/o TIA with double vision in 2013, HTN, HLD.   Yesterday morning she reports that when she woke up and looked over at her clock it looked blurry.  She denies double vision, facial droop or extremity weakness.  When she tried to get up to ambulate she reports that she felt d   Dysuria 06/15/2017   Elevated troponin 06/04/2016   Last Assessment & Plan:  Likely may be related to ventricular pacing and underlying cardiomyopathy.  The trajectory of the biomarker elevation and neither is her clinical history suggestive of acute coronary syndrome.  Further stratification with nuclear stress testing   Essential hypertension 06/04/2016   Last Assessment & Plan:  Formatting of this note might be different from the original. Continue carvedilol   Gastroesophageal reflux disease 11/10/2010   Last Assessment & Plan:  Formatting of this note might be different from the original. Continue Dexilant.   GERD (gastroesophageal reflux disease)    Glaucoma    Gout 12/02/2016   High cholesterol    History of ischemic stroke 06/30/2018   Hypertension    Hypertensive heart and kidney disease with heart failure and with chronic kidney disease stage III ([place]) 11/10/2010   Hypertensive heart disease with heart failure ([place]) 10/25/2014   Hypokalemia 06/04/2016   Last Assessment & Plan:  Replace with potassium chloride.   IBS (irritable bowel syndrome)    ICD (implantable cardioverter-defibrillator) in place 10/25/2014   Overview:  Overview:  dual chamber ICD,upgraded to CRT with LBBB and nonischemic cardiomyopathy  Formatting of this note might be different from the original. Overview:  dual chamber ICD,upgraded to CRT with LBBB and nonischemic cardiomyopathy  Overview:  Overview:  BIV -ICD,upgraded to CRT with LBBB and nonischemic cardiomyopathy  Burna Forts, CMA - 09/07/2016 3:00 PM EDT  REMOTE MONITORING ASSE   LBBB (left bundle branch block) 12/02/2016   Malignant hypertension with congestive heart failure ([place]) 05/21/2015   Migraine 12/02/2016   Mild CAD 10/25/2014   Mixed hyperlipidemia 11/10/2010   Last Assessment & Plan:  Formatting of this note might be different from the original. Continue statin therapy.   Nephrolithiasis 12/02/2016   Onychomycosis 11/10/2010   Osteoarthritis    PAD (peripheral artery disease) ([place]) 11/03/2014   Overview:  status post insertion of aortic stent and left common iliac stent by Dr. Bridgett Larsson in August of 2016   Peripheral arterial occlusive disease ([place]) 11/03/2014   Precordial chest pain 06/04/2016   Last Assessment & Plan:  My suspicion is that this is likely related to gastroesophageal reflux disease with acute indigestion last evening.  The biomarker elevation is not completely unexpected in this elderly patient with reported cardiomyopathy and current demand ventricular pacing.  Nonetheless she does have a history of extensive vascular disease (though apparently no significant coronary atherosclerosis in 2013) so we will proceed with a noninvasive risk stratification with pharmacological nuclear stress testing.  Assuming this is a low risk or normal study then I would  not advise any invasive cardiac evaluation at this time.   Preoperative cardiovascular examination 04/02/2015   Rectal bleeding 11/19/2017   Formatting of this note might be different from the original. 2019   Renal artery stenosis ([place]) 12/02/2016   Rheumatoid arthritis involving multiple sites ([place]) 05/21/2015   Screening for breast cancer 04/02/2015   Stroke [facility])    Type 2 diabetes mellitus ([place]) 11/10/2010   Last Assessment & Plan:  Formatting of this note might be different from the original. Continue glipizide and add sliding scale insulin coverage Formatting of this note might be different from the original. Per 01/28/2019 Podiatry VN.   Vitamin D deficiency 06/20/2016    Past Surgical  History:  Procedure Laterality Date   BIV ICD GENERATOR CHANGEOUT N/A 06/23/2019   Procedure: BIV ICD GENERATOR CHANGEOUT;  Surgeon: Constance Haw, MD;  Location: [place];  Service: Cardiovascular;  Laterality: N/A;   FOOT SURGERY     pacemaker surgery     a-fib and was done july 2014   PERIPHERAL VASCULAR CATHETERIZATION N/A 11/05/2014   Procedure: Abdominal Aortogram;  Surgeon: Conrad [place], MD;  Location: [place];  Service: Cardiovascular;  Laterality: N/A;    Current Medications: Current Meds  Medication Sig   acetaminophen (TYLENOL) 650 MG CR tablet Take 1,300 mg by mouth in the morning and at bedtime.   acyclovir (ZOVIRAX) 400 MG tablet Take 400 mg by mouth 5 (five) times daily.   atorvastatin (LIPITOR) 40 MG tablet Take 40 mg by mouth at bedtime.    Calcium Carbonate-Vit D-Min (CALCIUM 1200 PO) Take 1 tablet by mouth daily.   clopidogrel (PLAVIX) 75 MG tablet TAKE ONE TABLET BY MOUTH EVERY DAY   Docusate Calcium (STOOL SOFTENER PO) Take 200 mg by mouth at bedtime.   esomeprazole (NEXIUM) 40 MG packet Take 40 mg by mouth daily before breakfast.   fesoterodine (TOVIAZ) 4 MG TB24 tablet Take by mouth.   furosemide (LASIX) 80 MG tablet Take 1 tablet (80 mg total) by mouth daily. Take 1 tablet by mouth daily. Take an extra tablet by mouth daily in the evening if you weigh more than 123 lbs.   glipiZIDE (GLUCOTROL) 10 MG tablet Take 10 mg by mouth 2 (two) times daily before a meal.    isosorbide mononitrate (IMDUR) 60 MG 24 hr tablet Take 1 tablet (60 mg total) by mouth daily.   JANUVIA 25 MG tablet Take 25 mg by mouth daily.   ketoconazole (NIZORAL) 2 % shampoo Apply 1 application topically 2 (two) times a week.   metoprolol succinate (TOPROL-XL) 25 MG 24 hr tablet TAKE ONE TABLET BY MOUTH DAILY   montelukast (SINGULAIR) 10 MG tablet Take 10 mg by mouth at bedtime.   Multiple Vitamins-Minerals (THERA-M) TABS Take 1 tablet by mouth daily.   Multiple  Vitamins-Minerals (ZINC PO) Take 1 tablet by mouth daily at 12 noon.   neomycin-polymyxin-dexameth (MAXITROL) 0.1 % OINT Apply [id number] Application Both Eyes at bedtime Use for ten days then stop.   NITROSTAT 0.4 MG SL tablet DISSOLVE 1 TABLET UNDER THE TONGUE AS NEEDED FOR CHEST PAIN.   nystatin (MYCOSTATIN) 100000 UNIT/ML suspension Take 5 mLs by mouth 4 (four) times daily.   Omega-3 Fatty Acids (FISH OIL) 1000 MG CPDR Take 1,000 mg by mouth at bedtime.   potassium chloride (KLOR-CON) 10 MEQ tablet Take 1 tablet (10 mEq total) by mouth daily.   Prodigy Twist Top Lancets 28G MISC by Does not apply  route.   ramipril (ALTACE) 10 MG capsule Take 10 mg by mouth daily.   RESTASIS 0.05 % ophthalmic emulsion Place 1 drop into both eyes 2 (two) times daily.   sodium chloride (MURO 128) 5 % ophthalmic solution 1 drop 2 (two) times daily.   terbinafine (LAMISIL) 250 MG tablet Take 250 mg by mouth daily as needed (Dermatitis on scalp).    tolterodine (DETROL LA) 4 MG 24 hr capsule Take 4 mg by mouth daily.   traZODone (DESYREL) 50 MG tablet Take by mouth.   triamcinolone (KENALOG) 0.1 % Apply 1 application topically as needed (dermatitis).   triamcinolone cream (KENALOG) 0.5 % apply TWICE DAILY to itchy spots on scalp FOR 1-2 WEEKS AS NEEDED   VASCEPA 1 g CAPS Take 2 g by mouth 2 (two) times daily.    Vitamin D, Ergocalciferol, (DRISDOL) 50000 units CAPS capsule Take 50,000 Units by mouth every 14 (fourteen) days.    Zinc 50 MG CAPS Take 50 mg by mouth daily.     Allergies:   Codeine, Dulaglutide, Ivp dye [iodinated contrast media], Metformin, Metrizamide, and Pioglitazone   Social History   Socioeconomic History   Marital status: Married    Spouse name: Not on file   Number of children: Not on file   Years of education: Not on file   Highest education level: Not on file  Occupational History   Not on file  Tobacco Use   Smoking status: Never    Passive exposure: Never   Smokeless tobacco: Never   Vaping Use   Vaping Use: Never used  Substance and Sexual Activity   Alcohol use: No    Alcohol/week: 0.0 standard drinks   Drug use: No   Sexual activity: Not on file  Other Topics Concern   Not on file  Social History Narrative   Not on file   Social Determinants of Health   Financial Resource Strain: Not on file  Food Insecurity: Not on file  Transportation Needs: Not on file  Physical Activity: Not on file  Stress: Not on file  Social Connections: Not on file     Family History: The patient's family history includes Alzheimer's disease in her mother; Arthritis in her mother and sister; Bladder Cancer in her father; Heart attack in her maternal grandfather and maternal grandmother; Hyperlipidemia in her father and mother; Vitiligo in her sister. ROS:   Please see the history of present illness.    All other systems reviewed and are negative.  EKGs/Labs/Other Studies Reviewed:    The following studies were reviewed today:  EKG today shows normal dual-chamber pacemaker function atrial sensed ventricular paced  Physical Exam:    VS:  BP 118/60 (BP Location: Right Arm)    Pulse 69    Ht 5\' 2"  (1.575 m)    Wt 119 lb 6.4 oz (54.2 kg)    SpO2 98%    BMI 21.84 kg/m     Wt Readings from Last 3 Encounters:  06/08/21 119 lb 6.4 oz (54.2 kg)  12/09/20 118 lb 9.6 oz (53.8 kg)  06/08/20 123 lb (55.8 kg)     GEN: She is beginning to look frail well nourished, well developed in no acute distress HEENT: Normal NECK: No JVD; No carotid bruits LYMPHATICS: No lymphadenopathy CARDIAC: RRR, no murmurs, rubs, gallops RESPIRATORY:  Clear to auscultation without rales, wheezing or rhonchi  ABDOMEN: Soft, non-tender, non-distended MUSCULOSKELETAL:  No edema; No deformity  SKIN: Warm and dry NEUROLOGIC:  Alert and  oriented x 3 PSYCHIATRIC:  Normal affect    Signed, Shirlee More, MD  06/08/2021 1:14 PM     [facility]

## 2021-06-08 NOTE — Patient Instructions (Signed)
Medication Instructions:  ?Your physician recommends that you continue on your current medications as directed. Please refer to the Current Medication list given to you today. ? ?*If you need a refill on your cardiac medications before your next appointment, please call your pharmacy* ? ? ?Lab Work: ?NONE ?If you have labs (blood work) drawn today and your tests are completely normal, you will receive your results only by: ?MyChart Message (if you have MyChart) OR ?A paper copy in the mail ?If you have any lab test that is abnormal or we need to change your treatment, we will call you to review the results. ? ? ?Testing/Procedures: ?Your physician has requested that you have an echocardiogram. Echocardiography is a painless test that uses sound waves to create images of your heart. It provides your doctor with information about the size and shape of your heart and how well your heart?s chambers and valves are working. This procedure takes approximately one hour. There are no restrictions for this procedure.  ? ?Your physician has requested that you have a carotid duplex. This test is an ultrasound of the carotid arteries in your neck. It looks at blood flow through these arteries that supply the brain with blood. Allow one hour for this exam. There are no restrictions or special instructions.  ? ?Your physician has requested that you have an abdominal aorta duplex. During this test, an ultrasound is used to evaluate the aorta. Allow 30 minutes for this exam. Do not eat after midnight the day before and avoid carbonated beverages  ? ? ?Follow-Up: ?At [facility], you and your health needs are our priority.  As part of our continuing mission to provide you with exceptional heart care, we have created designated Provider Care Teams.  These Care Teams include your primary Cardiologist (physician) and Advanced Practice Providers (APPs -  Physician Assistants and Nurse Practitioners) who all work together to provide  you with the care you need, when you need it. ? ?We recommend signing up for the patient portal called "MyChart".  Sign up information is provided on this After Visit Summary.  MyChart is used to connect with patients for Virtual Visits (Telemedicine).  Patients are able to view lab/test results, encounter notes, upcoming appointments, etc.  Non-urgent messages can be sent to your provider as well.   ?To learn more about what you can do with MyChart, go to [email or web address].   ? ?Your next appointment:   ?6 month(s) ? ?The format for your next appointment:   ?In Person ? ?Provider:   ?Shirlee More, MD  ? ? ?Other Instructions ?  ?

## 2021-06-10 ENCOUNTER — Ambulatory Visit: Payer: Medicare PPO | Admitting: Sports Medicine

## 2021-06-13 ENCOUNTER — Other Ambulatory Visit: Payer: Self-pay

## 2021-06-13 ENCOUNTER — Encounter: Payer: Self-pay | Admitting: Cardiology

## 2021-06-13 ENCOUNTER — Ambulatory Visit (INDEPENDENT_AMBULATORY_CARE_PROVIDER_SITE_OTHER): Payer: Medicare PPO | Admitting: Cardiology

## 2021-06-13 VITALS — BP 122/70 | HR 70 | Ht 62.0 in | Wt 120.2 lb

## 2021-06-13 DIAGNOSIS — I5022 Chronic systolic (congestive) heart failure: Secondary | ICD-10-CM

## 2021-06-13 NOTE — Patient Instructions (Signed)
Medication Instructions:  ?NO CHANGES ?*If you need a refill on your cardiac medications before your next appointment, please call your pharmacy* ? ? ?Lab Work: ?NONE ?If you have labs (blood work) drawn today and your tests are completely normal, you will receive your results only by: ?MyChart Message (if you have MyChart) OR ?A paper copy in the mail ?If you have any lab test that is abnormal or we need to change your treatment, we will call you to review the results. ? ? ?Testing/Procedures: ?NONE ? ? ?Follow-Up: ?At [facility], you and your health needs are our priority.  As part of our continuing mission to provide you with exceptional heart care, we have created designated Provider Care Teams.  These Care Teams include your primary Cardiologist (physician) and Advanced Practice Providers (APPs -  Physician Assistants and Nurse Practitioners) who all work together to provide you with the care you need, when you need it. ? ?We recommend signing up for the patient portal called "MyChart".  Sign up information is provided on this After Visit Summary.  MyChart is used to connect with patients for Virtual Visits (Telemedicine).  Patients are able to view lab/test results, encounter notes, upcoming appointments, etc.  Non-urgent messages can be sent to your provider as well.   ?To learn more about what you can do with MyChart, go to [email or web address].   ? ?Your next appointment:   ?1 year(s) ? ?The format for your next appointment:   ?In Person ? ?Provider:   ?DR Elberta Fortis ? ?If primary card or EP is [id number] listed click here to update    :1}  ? ? ?Other Instructions ?NONE  ?

## 2021-06-13 NOTE — Progress Notes (Signed)
Electrophysiology Office Note   Date:  06/13/2021   ID:  Shari, [name] 1934-08-06, MRN [id number]  PCP:  Algis Greenhouse, MD  Cardiologist:  Bettina Gavia Primary Electrophysiologist:  Zacheriah Stumpe [name], MD    No chief complaint on file.    History of Present Illness: Shari [name] is a 87 y.o. female who is being seen today for the evaluation of CHF at the request of [name], Jaymes Graff, MD. Presenting today for electrophysiology evaluation.   She has a history significant for hypertension, coronary artery disease, chronic systolic heart failure, hyperlipidemia.  She is status post Remy CRT-D.  She had a generator change 06/23/2019.  Fortunately on her last echo, her ejection fraction has normalized.  Today, denies symptoms of palpitations, chest pain, shortness of breath, orthopnea, PND, lower extremity edema, claudication, dizziness, presyncope, syncope, bleeding, or neurologic sequela. The patient is tolerating medications without difficulties.  She has been doing well.  She does note that she has a little bit more fatigue and shortness of breath.  She has planned for an upcoming transthoracic echo.  She otherwise has no major complaints today.   Past Medical History:  Diagnosis Date   Anxiety    Automatic implantable cardioverter-defibrillator in situ 10/25/2014   Overview:  Overview:  dual chamber ICD,upgraded to CRT with LBBB and nonischemic cardiomyopathy   Biventricular ICD (implantable cardioverter-defibrillator) in place 10/25/2014   Overview:  BIV -ICD,upgraded to CRT with LBBB and nonischemic cardiomyopathy   Chronic combined systolic and diastolic heart failure ([place]) 10/25/2014   Overview:  EF 77% by MUGA 06/15/15  Formatting of this note might be different from the original. Overview:  EF 66% by MUGA 03/12/14  Overview:  Overview:  EF 77% by MUGA 06/15/15  Overview:  EF 66% by MUGA 03/12/14  Overview:  Overview:  EF 77% by MUGA 06/15/15 Formatting of this note might be  different from the original. EF 66% by MUGA 03/12/14   Chronic coronary artery disease 10/25/2014   Chronic kidney disease, stage 3 ([place]) 12/05/2016   Combined systolic and diastolic congestive heart failure ([place]) 10/25/2014   Last Assessment & Plan:  Formatting of this note might be different from the original. Type and degree unknown though patient currently appears to be euvolemic.   Coronary artery disease 10/25/2014   Costochondritis 10/25/2014   Diabetes mellitus 2 years   Diverticulitis    Dizziness 06/30/2018   Last Assessment & Plan:  Formatting of this note might be different from the original. 87 yo F with history of CHF, DM, PVD, h/o TIA with double vision in 2013, HTN, HLD.   Yesterday morning she reports that when she woke up and looked over at her clock it looked blurry.  She denies double vision, facial droop or extremity weakness.  When she tried to get up to ambulate she reports that she felt d   Dysuria 06/15/2017   Elevated troponin 06/04/2016   Last Assessment & Plan:  Likely may be related to ventricular pacing and underlying cardiomyopathy.  The trajectory of the biomarker elevation and neither is her clinical history suggestive of acute coronary syndrome.  Further stratification with nuclear stress testing   Essential hypertension 06/04/2016   Last Assessment & Plan:  Formatting of this note might be different from the original. Continue carvedilol   Gastroesophageal reflux disease 11/10/2010   Last Assessment & Plan:  Formatting of this note might be different from the original. Continue Dexilant.   GERD (gastroesophageal  reflux disease)    Glaucoma    Gout 12/02/2016   High cholesterol    History of ischemic stroke 06/30/2018   Hypertension    Hypertensive heart and kidney disease with heart failure and with chronic kidney disease stage III ([place]) 11/10/2010   Hypertensive heart disease with heart failure ([place]) 10/25/2014   Hypokalemia 06/04/2016   Last Assessment & Plan:  Replace with  potassium chloride.   IBS (irritable bowel syndrome)    ICD (implantable cardioverter-defibrillator) in place 10/25/2014   Overview:  Overview:  dual chamber ICD,upgraded to CRT with LBBB and nonischemic cardiomyopathy  Formatting of this note might be different from the original. Overview:  dual chamber ICD,upgraded to CRT with LBBB and nonischemic cardiomyopathy  Overview:  Overview:  BIV -ICD,upgraded to CRT with LBBB and nonischemic cardiomyopathy  Burna Forts, CMA - 09/07/2016 3:00 PM EDT REMOTE MONITORING ASSE   LBBB (left bundle branch block) 12/02/2016   Malignant hypertension with congestive heart failure ([place]) 05/21/2015   Migraine 12/02/2016   Mild CAD 10/25/2014   Mixed hyperlipidemia 11/10/2010   Last Assessment & Plan:  Formatting of this note might be different from the original. Continue statin therapy.   Nephrolithiasis 12/02/2016   Onychomycosis 11/10/2010   Osteoarthritis    PAD (peripheral artery disease) ([place]) 11/03/2014   Overview:  status post insertion of aortic stent and left common iliac stent by Dr. Bridgett Larsson in August of 2016   Peripheral arterial occlusive disease ([place]) 11/03/2014   Precordial chest pain 06/04/2016   Last Assessment & Plan:  My suspicion is that this is likely related to gastroesophageal reflux disease with acute indigestion last evening.  The biomarker elevation is not completely unexpected in this elderly patient with reported cardiomyopathy and current demand ventricular pacing.  Nonetheless she does have a history of extensive vascular disease (though apparently no significant coronary atherosclerosis in 2013) so we Tremell Reimers proceed with a noninvasive risk stratification with pharmacological nuclear stress testing.  Assuming this is a low risk or normal study then I would not advise any invasive cardiac evaluation at this time.   Preoperative cardiovascular examination 04/02/2015   Rectal bleeding 11/19/2017   Formatting of this note might be different from the original.  2019   Renal artery stenosis ([place]) 12/02/2016   Rheumatoid arthritis involving multiple sites ([place]) 05/21/2015   Screening for breast cancer 04/02/2015   Stroke [facility])    Type 2 diabetes mellitus ([place]) 11/10/2010   Last Assessment & Plan:  Formatting of this note might be different from the original. Continue glipizide and add sliding scale insulin coverage Formatting of this note might be different from the original. Per 01/28/2019 Podiatry VN.   Vitamin D deficiency 06/20/2016   Past Surgical History:  Procedure Laterality Date   BIV ICD GENERATOR CHANGEOUT N/A 06/23/2019   Procedure: BIV ICD GENERATOR CHANGEOUT;  Surgeon: Constance Haw, MD;  Location: [facility];  Service: Cardiovascular;  Laterality: N/A;   FOOT SURGERY     pacemaker surgery     a-fib and was done july 2014   PERIPHERAL VASCULAR CATHETERIZATION N/A 11/05/2014   Procedure: Abdominal Aortogram;  Surgeon: Conrad [place], MD;  Location: [facility];  Service: Cardiovascular;  Laterality: N/A;     Current Outpatient Medications  Medication Sig Dispense Refill   acetaminophen (TYLENOL) 650 MG CR tablet Take 1,300 mg by mouth in the morning and at bedtime.     acyclovir (ZOVIRAX) 400 MG tablet Take 400 mg  by mouth 5 (five) times daily.     atorvastatin (LIPITOR) 40 MG tablet Take 40 mg by mouth at bedtime.      Calcium Carbonate-Vit D-Min (CALCIUM 1200 PO) Take 1 tablet by mouth daily.     clopidogrel (PLAVIX) 75 MG tablet TAKE ONE TABLET BY MOUTH EVERY DAY 90 tablet 2   Docusate Calcium (STOOL SOFTENER PO) Take 200 mg by mouth at bedtime.     esomeprazole (NEXIUM) 40 MG packet Take 40 mg by mouth daily before breakfast.     fesoterodine (TOVIAZ) 4 MG TB24 tablet Take 4 mg by mouth daily.     furosemide (LASIX) 80 MG tablet Take 1 tablet (80 mg total) by mouth daily. Take 1 tablet by mouth daily. Take an extra tablet by mouth daily in the evening if you weigh more than 123 lbs. 90 tablet 3   glipiZIDE  (GLUCOTROL) 10 MG tablet Take 10 mg by mouth 2 (two) times daily before a meal.      isosorbide mononitrate (IMDUR) 60 MG 24 hr tablet Take 1 tablet (60 mg total) by mouth daily. 90 tablet 2   JANUVIA 25 MG tablet Take 25 mg by mouth daily.     ketoconazole (NIZORAL) 2 % shampoo Apply 1 application topically 2 (two) times a week.     metoprolol succinate (TOPROL-XL) 25 MG 24 hr tablet TAKE ONE TABLET BY MOUTH DAILY 90 tablet 2   montelukast (SINGULAIR) 10 MG tablet Take 10 mg by mouth at bedtime.     Multiple Vitamins-Minerals (THERA-M) TABS Take 1 tablet by mouth daily.     Multiple Vitamins-Minerals (ZINC PO) Take 1 tablet by mouth daily at 12 noon.     neomycin-polymyxin-dexameth (MAXITROL) 0.1 % OINT Apply [id number] Application Both Eyes at bedtime Use for ten days then stop.     NITROSTAT 0.4 MG SL tablet DISSOLVE 1 TABLET UNDER THE TONGUE AS NEEDED FOR CHEST PAIN. 25 tablet 8   nystatin (MYCOSTATIN) 100000 UNIT/ML suspension Take 5 mLs by mouth 4 (four) times daily.     Omega-3 Fatty Acids (FISH OIL) 1000 MG CPDR Take 1,000 mg by mouth at bedtime.     potassium chloride (KLOR-CON) 10 MEQ tablet Take 1 tablet (10 mEq total) by mouth daily. 90 tablet 1   Prodigy Twist Top Lancets 28G MISC by Does not apply route.     ramipril (ALTACE) 10 MG capsule Take 10 mg by mouth daily.     RESTASIS 0.05 % ophthalmic emulsion Place 1 drop into both eyes 2 (two) times daily.     sodium chloride (MURO 128) 5 % ophthalmic solution 1 drop 2 (two) times daily.     terbinafine (LAMISIL) 250 MG tablet Take 250 mg by mouth daily as needed (Dermatitis on scalp).      tolterodine (DETROL LA) 4 MG 24 hr capsule Take 4 mg by mouth daily.     traZODone (DESYREL) 50 MG tablet Take by mouth.     triamcinolone cream (KENALOG) 0.5 % apply TWICE DAILY to itchy spots on scalp FOR 1-2 WEEKS AS NEEDED     VASCEPA 1 g CAPS Take 2 g by mouth 2 (two) times daily.   3   Vitamin D, Ergocalciferol, (DRISDOL) 50000 units CAPS capsule  Take 50,000 Units by mouth every 14 (fourteen) days.      Zinc 50 MG CAPS Take 50 mg by mouth daily.     No current facility-administered medications for this visit.    Allergies:  Doxycycline, Metformin, Codeine, Dulaglutide, Ivp dye [iodinated contrast media], Metrizamide, and Pioglitazone   Social History:  The patient  reports that she has never smoked. She has never been exposed to tobacco smoke. She has never used smokeless tobacco. She reports that she does not drink alcohol and does not use drugs.   Family History:  The patient's family history includes Alzheimer's disease in her mother; Arthritis in her mother and sister; Bladder Cancer in her father; Heart attack in her maternal grandfather and maternal grandmother; Hyperlipidemia in her father and mother; Vitiligo in her sister.   ROS:  Please see the history of present illness.   Otherwise, review of systems is positive for none.   All other systems are reviewed and negative.   PHYSICAL EXAM: VS:  BP 122/70    Pulse 70    Ht 5\' 2"  (1.575 m)    Wt 120 lb 3.2 oz (54.5 kg)    SpO2 95%    BMI 21.98 kg/m  , BMI Body mass index is 21.98 kg/m. GEN: Well nourished, well developed, in no acute distress  HEENT: normal  Neck: no JVD, carotid bruits, or masses Cardiac: RRR; no murmurs, rubs, or gallops,no edema  Respiratory:  clear to auscultation bilaterally, normal work of breathing GI: soft, nontender, nondistended, + BS MS: no deformity or atrophy  Skin: warm and dry, device site well healed Neuro:  Strength and sensation are intact Psych: euthymic mood, full affect  EKG:  EKG is not ordered today. Personal review of the ekg ordered 06/08/21 shows atrial sensed, ventricular paced  Personal review of the device interrogation today. Results in [place]: No results found for requested labs within last 8760 hours.    Lipid Panel  No results found for: CHOL, TRIG, HDL, CHOLHDL, VLDL, LDLCALC, LDLDIRECT   Wt  Readings from Last 3 Encounters:  06/13/21 120 lb 3.2 oz (54.5 kg)  06/08/21 119 lb 6.4 oz (54.2 kg)  12/09/20 118 lb 9.6 oz (53.8 kg)      Other studies Reviewed: Additional studies/ records that were reviewed today include: TTE 05/22/2017 Review of the above records today demonstrates:  - Left ventricle: The cavity size was normal. Systolic function was   normal. The estimated ejection fraction was in the range of 60%   to 65%. Wall motion was normal; there were no regional wall   motion abnormalities. - Aortic valve: There was mild regurgitation. Valve area (VTI):   1.83 cm^2. Valve area (Vmax): 1.72 cm^2. Valve area (Vmean): 1.71   cm^2.   ASSESSMENT AND PLAN:  1.  Chronic systolic heart failure: Status post Saint Jude CRT-D.  Device functioning appropriately.  Currently on optimal medical therapy.  She does have some fatigue.  She Shari [name] take her metoprolol in the evening.  2.  Hypertension: Currently well controlled  3.  Hyperlipidemia: Atorvastatin 40 mg  4.  Coronary artery disease: Currently on Plavix 75 mg daily.  No current chest pain.   Current medicines are reviewed at length with the patient today.   The patient does not have concerns regarding her medicines.  The following changes were made today: None  Labs/ tests ordered today include:  No orders of the defined types were placed in this encounter.    Disposition:   FU with Marisabel Macpherson 12 months  Signed, Shari Chastang [name], MD  06/13/2021 10:52 AM     [facility] [address] [place] [address] [phone] (office) [phone] (  fax)

## 2021-06-14 ENCOUNTER — Other Ambulatory Visit: Payer: Self-pay

## 2021-06-14 ENCOUNTER — Encounter: Payer: Self-pay | Admitting: Sports Medicine

## 2021-06-14 ENCOUNTER — Ambulatory Visit: Payer: Medicare PPO | Admitting: Sports Medicine

## 2021-06-14 DIAGNOSIS — B351 Tinea unguium: Secondary | ICD-10-CM

## 2021-06-14 DIAGNOSIS — E1140 Type 2 diabetes mellitus with diabetic neuropathy, unspecified: Secondary | ICD-10-CM

## 2021-06-14 DIAGNOSIS — M79674 Pain in right toe(s): Secondary | ICD-10-CM | POA: Diagnosis not present

## 2021-06-14 DIAGNOSIS — I739 Peripheral vascular disease, unspecified: Secondary | ICD-10-CM

## 2021-06-14 DIAGNOSIS — M79675 Pain in left toe(s): Secondary | ICD-10-CM

## 2021-06-14 NOTE — Progress Notes (Signed)
Subjective: ?Shari [name] is a 87 y.o. female patient with history of diabetes who returns to office today complaining of callus skin and long, painful nails  while ambulating in shoes; unable to trim.  Patient reports that she had an episode of a blister popping up that she applied Neosporin to but now it is better.  Denies any changes in medication or health history since last encounter.  ? ?FBS not recorded  ? ?PCP Algis Greenhouse, MD visitFebruary [phone] ? ?Patient Active Problem List  ? Diagnosis Date Noted  ? After cataract not obscuring vision, bilateral 05/21/2021  ? Dry eye syndrome of both lacrimal glands 05/21/2021  ? Fuchs' corneal dystrophy of both eyes 05/21/2021  ? Meibomian gland dysfunction (MGD) of both eyes 05/21/2021  ? Pseudophakia of both eyes 05/21/2021  ? COVID-19 12/20/2020  ? Urge incontinence of urine 09/08/2020  ? Hypertension   ? High cholesterol   ? GERD (gastroesophageal reflux disease)   ? Anxiety   ? Stroke ([place]) 08/07/2019  ? Dizziness 06/30/2018  ? History of ischemic stroke 06/30/2018  ? Rectal bleeding 11/19/2017  ? Dysuria 06/15/2017  ? Chronic kidney disease, stage 3 ([place]) 12/05/2016  ? Gout 12/02/2016  ? Migraine 12/02/2016  ? Nephrolithiasis 12/02/2016  ? LBBB (left bundle branch block) 12/02/2016  ? Renal artery stenosis ([place]) 12/02/2016  ? Vitamin D deficiency 06/20/2016  ? Elevated troponin 06/04/2016  ? Hypokalemia 06/04/2016  ? Precordial chest pain 06/04/2016  ? Essential hypertension 06/04/2016  ? Rheumatoid arthritis involving multiple sites ([place]) 05/21/2015  ? Malignant hypertension with congestive heart failure ([place]) 05/21/2015  ? Screening for breast cancer 04/02/2015  ? Preoperative cardiovascular examination 04/02/2015  ? PAD (peripheral artery disease) ([place]) 11/03/2014  ? Peripheral arterial occlusive disease ([place]) 11/03/2014  ? ICD (implantable cardioverter-defibrillator) in place 10/25/2014  ? Biventricular ICD (implantable cardioverter-defibrillator) in place  10/25/2014  ? Chronic combined systolic and diastolic heart failure ([place]) 10/25/2014  ? Costochondritis 10/25/2014  ? Combined systolic and diastolic congestive heart failure ([place]) 10/25/2014  ? Coronary artery disease 10/25/2014  ? Mild CAD 10/25/2014  ? Hypertensive heart disease with heart failure ([place]) 10/25/2014  ? Chronic coronary artery disease 10/25/2014  ? Automatic implantable cardioverter-defibrillator in situ 10/25/2014  ? Mixed hyperlipidemia 11/10/2010  ? Hypertensive heart and kidney disease with heart failure and with chronic kidney disease stage III ([place]) 11/10/2010  ? ibs 11/10/2010  ? Osteoarthritis 11/10/2010  ? Diverticulitis 11/10/2010  ? Gastroesophageal reflux disease 11/10/2010  ? Glaucoma 11/10/2010  ? Type 2 diabetes mellitus ([place]) 11/10/2010  ? Onychomycosis 11/10/2010  ? ?Current Outpatient Medications on File Prior to Visit  ?Medication Sig Dispense Refill  ? acetaminophen (TYLENOL) 650 MG CR tablet Take 1,300 mg by mouth in the morning and at bedtime.    ? acyclovir (ZOVIRAX) 400 MG tablet Take 400 mg by mouth 5 (five) times daily.    ? atorvastatin (LIPITOR) 40 MG tablet Take 40 mg by mouth at bedtime.     ? Calcium Carbonate-Vit D-Min (CALCIUM 1200 PO) Take 1 tablet by mouth daily.    ? clopidogrel (PLAVIX) 75 MG tablet TAKE ONE TABLET BY MOUTH EVERY DAY 90 tablet 2  ? Docusate Calcium (STOOL SOFTENER PO) Take 200 mg by mouth at bedtime.    ? esomeprazole (NEXIUM) 40 MG packet Take 40 mg by mouth daily before breakfast.    ? fesoterodine (TOVIAZ) 4 MG TB24 tablet Take 4 mg by mouth daily.    ? furosemide (LASIX)  80 MG tablet Take 1 tablet (80 mg total) by mouth daily. Take 1 tablet by mouth daily. Take an extra tablet by mouth daily in the evening if you weigh more than 123 lbs. 90 tablet 3  ? glipiZIDE (GLUCOTROL) 10 MG tablet Take 10 mg by mouth 2 (two) times daily before a meal.     ? isosorbide mononitrate (IMDUR) 60 MG 24 hr tablet Take 1 tablet (60 mg total) by mouth daily. 90  tablet 2  ? JANUVIA 25 MG tablet Take 25 mg by mouth daily.    ? ketoconazole (NIZORAL) 2 % shampoo Apply 1 application topically 2 (two) times a week.    ? metoprolol succinate (TOPROL-XL) 25 MG 24 hr tablet TAKE ONE TABLET BY MOUTH DAILY 90 tablet 2  ? montelukast (SINGULAIR) 10 MG tablet Take 10 mg by mouth at bedtime.    ? Multiple Vitamins-Minerals (THERA-M) TABS Take 1 tablet by mouth daily.    ? Multiple Vitamins-Minerals (ZINC PO) Take 1 tablet by mouth daily at 12 noon.    ? neomycin-polymyxin-dexameth (MAXITROL) 0.1 % OINT Apply [id number] Application Both Eyes at bedtime Use for ten days then stop.    ? NITROSTAT 0.4 MG SL tablet DISSOLVE 1 TABLET UNDER THE TONGUE AS NEEDED FOR CHEST PAIN. 25 tablet 8  ? nystatin (MYCOSTATIN) 100000 UNIT/ML suspension Take 5 mLs by mouth 4 (four) times daily.    ? Omega-3 Fatty Acids (FISH OIL) 1000 MG CPDR Take 1,000 mg by mouth at bedtime.    ? potassium chloride (KLOR-CON) 10 MEQ tablet Take 1 tablet (10 mEq total) by mouth daily. 90 tablet 1  ? Prodigy Twist Top Lancets 28G MISC by Does not apply route.    ? ramipril (ALTACE) 10 MG capsule Take 10 mg by mouth daily.    ? RESTASIS 0.05 % ophthalmic emulsion Place 1 drop into both eyes 2 (two) times daily.    ? sodium chloride (MURO 128) 5 % ophthalmic solution 1 drop 2 (two) times daily.    ? terbinafine (LAMISIL) 250 MG tablet Take 250 mg by mouth daily as needed (Dermatitis on scalp).     ? tolterodine (DETROL LA) 4 MG 24 hr capsule Take 4 mg by mouth daily.    ? traZODone (DESYREL) 50 MG tablet Take by mouth.    ? triamcinolone cream (KENALOG) 0.5 % apply TWICE DAILY to itchy spots on scalp FOR 1-2 WEEKS AS NEEDED    ? VASCEPA 1 g CAPS Take 2 g by mouth 2 (two) times daily.   3  ? Vitamin D, Ergocalciferol, (DRISDOL) 50000 units CAPS capsule Take 50,000 Units by mouth every 14 (fourteen) days.     ? Zinc 50 MG CAPS Take 50 mg by mouth daily.    ? ?No current facility-administered medications on file prior to visit.   ? ?Allergies  ?Allergen Reactions  ? Doxycycline Nausea Only  ? Metformin Other (See Comments) and Nausea Only  ? Codeine Diarrhea, Nausea And Vomiting, Other (See Comments) and Hives  ?  All-over body aches, too ?Other reaction(s): GI intolerance  ? Dulaglutide Other (See Comments)  ?  Chest pain, N/V  ? Ivp Dye [Iodinated Contrast Media] Nausea And Vomiting and Other (See Comments)  ? Metrizamide Nausea And Vomiting and Other (See Comments)  ? Pioglitazone Other (See Comments)  ?  Has CHF  ? ? ?Recent Results (from the past 2160 hour(s))  ?CUP PACEART REMOTE DEVICE CHECK     Status: None  ?  Collection Time: 03/23/21  6:54 AM  ?Result Value Ref Range  ? Date Time Interrogation Session [phone]   ? Pulse Generator Manufacturer OTHER   ? Pulse Gen Model [id number] Gallant HF   ? Pulse Gen Serial Number [id number]   ? Clinic Name [facility]   ? Implantable Pulse Generator Type Cardiac Resynch Therapy Defibulator   ? Implantable Pulse Generator Implant Date [id number]   ? Implantable Lead Manufacturer [employer]   ? Implantable Lead Model [place]   ? Implantable Lead Serial Number [id number]   ? Implantable Lead Implant Date [id number]   ? Implantable Lead Location Detail 1 UNKNOWN   ? Implantable Lead Location [id number]   ? Implantable Lead Manufacturer SJCR   ? Implantable Lead Model [id number] Tendril STS   ? Implantable Lead Serial Number [id number]   ? Implantable Lead Implant Date [id number]   ? Implantable Lead Location Detail 1 UNKNOWN   ? Implantable Lead Location [id number]   ? Implantable Lead Manufacturer SJCR   ? Implantable Lead Model 7122 [place]   ? Implantable Lead Serial Number [id number]   ? Implantable Lead Implant Date [id number]   ? Implantable Lead Location Detail 1 UNKNOWN   ? Implantable Lead Location [phone]   ? Lead Channel Setting Sensing Sensitivity 0.5 mV  ? Lead Channel Setting Sensing Adaptation Mode Adaptive Sensing   ? Lead Channel Setting Pacing Amplitude 2.0 V  ? Lead Channel Setting Pacing Pulse  Width 0.4 ms  ? Lead Channel Setting Pacing Amplitude 2.5 V  ? Lead Channel Setting Pacing Pulse Width 1.2 ms  ? Lead Channel Setting Pacing Amplitude 3.25 V  ? Lead Channel Setting Pacing Capture Mode Fixed

## 2021-06-20 ENCOUNTER — Other Ambulatory Visit: Payer: Self-pay

## 2021-06-20 ENCOUNTER — Ambulatory Visit (INDEPENDENT_AMBULATORY_CARE_PROVIDER_SITE_OTHER): Payer: Medicare PPO

## 2021-06-20 DIAGNOSIS — Z95828 Presence of other vascular implants and grafts: Secondary | ICD-10-CM | POA: Diagnosis not present

## 2021-06-20 DIAGNOSIS — E785 Hyperlipidemia, unspecified: Secondary | ICD-10-CM

## 2021-06-20 DIAGNOSIS — I2510 Atherosclerotic heart disease of native coronary artery without angina pectoris: Secondary | ICD-10-CM | POA: Diagnosis not present

## 2021-06-20 DIAGNOSIS — Z95810 Presence of automatic (implantable) cardiac defibrillator: Secondary | ICD-10-CM

## 2021-06-20 DIAGNOSIS — I130 Hypertensive heart and chronic kidney disease with heart failure and stage 1 through stage 4 chronic kidney disease, or unspecified chronic kidney disease: Secondary | ICD-10-CM

## 2021-06-20 DIAGNOSIS — I428 Other cardiomyopathies: Secondary | ICD-10-CM | POA: Diagnosis not present

## 2021-06-20 DIAGNOSIS — N183 Chronic kidney disease, stage 3 unspecified: Secondary | ICD-10-CM

## 2021-06-20 LAB — ECHOCARDIOGRAM COMPLETE
Area-P 1/2: 2.4 cm2
S' Lateral: 2 cm

## 2021-06-22 ENCOUNTER — Ambulatory Visit (INDEPENDENT_AMBULATORY_CARE_PROVIDER_SITE_OTHER): Payer: Medicare PPO

## 2021-06-22 DIAGNOSIS — I428 Other cardiomyopathies: Secondary | ICD-10-CM | POA: Diagnosis not present

## 2021-06-23 LAB — CUP PACEART REMOTE DEVICE CHECK
Battery Remaining Longevity: 32 mo
Battery Remaining Percentage: 59 %
Battery Voltage: 2.93 V
Brady Statistic AP VP Percent: 1 %
Brady Statistic AP VS Percent: 1 %
Brady Statistic AS VP Percent: 99 %
Brady Statistic AS VS Percent: 1 %
Brady Statistic RA Percent Paced: 1 %
Date Time Interrogation Session: 20230320030007
HighPow Impedance: 57 Ohm
Implantable Lead Implant Date: 20140702
Implantable Lead Implant Date: 20140702
Implantable Lead Implant Date: 20150730
Implantable Lead Location: 753858
Implantable Lead Location: 753859
Implantable Lead Location: 753860
Implantable Lead Model: 7122
Implantable Pulse Generator Implant Date: 20210322
Lead Channel Impedance Value: 330 Ohm
Lead Channel Impedance Value: 430 Ohm
Lead Channel Impedance Value: 450 Ohm
Lead Channel Pacing Threshold Amplitude: 1 V
Lead Channel Pacing Threshold Amplitude: 1 V
Lead Channel Pacing Threshold Amplitude: 2.5 V
Lead Channel Pacing Threshold Pulse Width: 0.4 ms
Lead Channel Pacing Threshold Pulse Width: 0.5 ms
Lead Channel Pacing Threshold Pulse Width: 1.2 ms
Lead Channel Sensing Intrinsic Amplitude: 1.6 mV
Lead Channel Sensing Intrinsic Amplitude: 12 mV
Lead Channel Setting Pacing Amplitude: 2 V
Lead Channel Setting Pacing Amplitude: 2.5 V
Lead Channel Setting Pacing Amplitude: 3.25 V
Lead Channel Setting Pacing Pulse Width: 0.4 ms
Lead Channel Setting Pacing Pulse Width: 1.2 ms
Lead Channel Setting Sensing Sensitivity: 0.5 mV
Pulse Gen Serial Number: 810001491

## 2021-06-27 ENCOUNTER — Ambulatory Visit (INDEPENDENT_AMBULATORY_CARE_PROVIDER_SITE_OTHER): Payer: Medicare PPO

## 2021-06-27 ENCOUNTER — Other Ambulatory Visit: Payer: Self-pay

## 2021-06-27 DIAGNOSIS — I130 Hypertensive heart and chronic kidney disease with heart failure and stage 1 through stage 4 chronic kidney disease, or unspecified chronic kidney disease: Secondary | ICD-10-CM

## 2021-06-27 DIAGNOSIS — I428 Other cardiomyopathies: Secondary | ICD-10-CM | POA: Diagnosis not present

## 2021-06-27 DIAGNOSIS — Z95810 Presence of automatic (implantable) cardiac defibrillator: Secondary | ICD-10-CM

## 2021-06-27 DIAGNOSIS — Z8673 Personal history of transient ischemic attack (TIA), and cerebral infarction without residual deficits: Secondary | ICD-10-CM | POA: Diagnosis not present

## 2021-06-27 DIAGNOSIS — N183 Chronic kidney disease, stage 3 unspecified: Secondary | ICD-10-CM

## 2021-06-27 DIAGNOSIS — E785 Hyperlipidemia, unspecified: Secondary | ICD-10-CM

## 2021-06-27 DIAGNOSIS — I2510 Atherosclerotic heart disease of native coronary artery without angina pectoris: Secondary | ICD-10-CM

## 2021-07-06 NOTE — Progress Notes (Signed)
Remote ICD transmission.   

## 2021-08-02 ENCOUNTER — Other Ambulatory Visit: Payer: Self-pay | Admitting: Cardiology

## 2021-08-02 DIAGNOSIS — I739 Peripheral vascular disease, unspecified: Secondary | ICD-10-CM

## 2021-08-18 DIAGNOSIS — N390 Urinary tract infection, site not specified: Secondary | ICD-10-CM

## 2021-08-18 HISTORY — DX: Urinary tract infection, site not specified: N39.0

## 2021-08-24 DIAGNOSIS — R0789 Other chest pain: Secondary | ICD-10-CM

## 2021-08-24 HISTORY — DX: Other chest pain: R07.89

## 2021-09-15 ENCOUNTER — Ambulatory Visit: Payer: Medicare PPO | Admitting: Podiatry

## 2021-09-21 ENCOUNTER — Ambulatory Visit (INDEPENDENT_AMBULATORY_CARE_PROVIDER_SITE_OTHER): Payer: Medicare PPO

## 2021-09-21 DIAGNOSIS — I428 Other cardiomyopathies: Secondary | ICD-10-CM

## 2021-09-22 ENCOUNTER — Encounter: Payer: Self-pay | Admitting: Podiatry

## 2021-09-22 ENCOUNTER — Ambulatory Visit: Payer: Medicare PPO | Admitting: Podiatry

## 2021-09-22 DIAGNOSIS — M2042 Other hammer toe(s) (acquired), left foot: Secondary | ICD-10-CM

## 2021-09-22 DIAGNOSIS — M2012 Hallux valgus (acquired), left foot: Secondary | ICD-10-CM

## 2021-09-22 DIAGNOSIS — M2011 Hallux valgus (acquired), right foot: Secondary | ICD-10-CM

## 2021-09-22 DIAGNOSIS — E119 Type 2 diabetes mellitus without complications: Secondary | ICD-10-CM

## 2021-09-22 DIAGNOSIS — B351 Tinea unguium: Secondary | ICD-10-CM

## 2021-09-22 DIAGNOSIS — M2041 Other hammer toe(s) (acquired), right foot: Secondary | ICD-10-CM

## 2021-09-22 DIAGNOSIS — M79675 Pain in left toe(s): Secondary | ICD-10-CM | POA: Diagnosis not present

## 2021-09-22 DIAGNOSIS — M79674 Pain in right toe(s): Secondary | ICD-10-CM | POA: Diagnosis not present

## 2021-09-22 DIAGNOSIS — E1151 Type 2 diabetes mellitus with diabetic peripheral angiopathy without gangrene: Secondary | ICD-10-CM

## 2021-09-22 DIAGNOSIS — L84 Corns and callosities: Secondary | ICD-10-CM | POA: Diagnosis not present

## 2021-09-22 NOTE — Progress Notes (Signed)
ANNUAL DIABETIC FOOT EXAM  Subjective: Shari [name] presents today for annual diabetic foot examination.  Patient relates 20 year h/o diabetes.  Patient denies any h/o foot wounds.   Patient admits symptoms of foot tingling.  Patient's blood sugar was 191 mg/dl today. Last known  HgA1c was 7.4%   Risk factors: diabetes, PAD, h/o CVA, HTN, CAD, CHF, CKD.  Algis Greenhouse, MD is patient's PCP. Last visit was September 15, 2021.  Past Medical History:  Diagnosis Date   Anxiety    Automatic implantable cardioverter-defibrillator in situ 10/25/2014   Overview:  Overview:  dual chamber ICD,upgraded to CRT with LBBB and nonischemic cardiomyopathy   Biventricular ICD (implantable cardioverter-defibrillator) in place 10/25/2014   Overview:  BIV -ICD,upgraded to CRT with LBBB and nonischemic cardiomyopathy   Chronic combined systolic and diastolic heart failure ([place]) 10/25/2014   Overview:  EF 77% by MUGA 06/15/15  Formatting of this note might be different from the original. Overview:  EF 66% by MUGA 03/12/14  Overview:  Overview:  EF 77% by MUGA 06/15/15  Overview:  EF 66% by MUGA 03/12/14  Overview:  Overview:  EF 77% by MUGA 06/15/15 Formatting of this note might be different from the original. EF 66% by MUGA 03/12/14   Chronic coronary artery disease 10/25/2014   Chronic kidney disease, stage 3 ([place]) 12/05/2016   Combined systolic and diastolic congestive heart failure ([place]) 10/25/2014   Last Assessment & Plan:  Formatting of this note might be different from the original. Type and degree unknown though patient currently appears to be euvolemic.   Coronary artery disease 10/25/2014   Costochondritis 10/25/2014   Diabetes mellitus 2 years   Diverticulitis    Dizziness 06/30/2018   Last Assessment & Plan:  Formatting of this note might be different from the original. 87 yo F with history of CHF, DM, PVD, h/o TIA with double vision in 2013, HTN, HLD.   Yesterday morning she reports that when she woke up  and looked over at her clock it looked blurry.  She denies double vision, facial droop or extremity weakness.  When she tried to get up to ambulate she reports that she felt d   Dysuria 06/15/2017   Elevated troponin 06/04/2016   Last Assessment & Plan:  Likely may be related to ventricular pacing and underlying cardiomyopathy.  The trajectory of the biomarker elevation and neither is her clinical history suggestive of acute coronary syndrome.  Further stratification with nuclear stress testing   Essential hypertension 06/04/2016   Last Assessment & Plan:  Formatting of this note might be different from the original. Continue carvedilol   Gastroesophageal reflux disease 11/10/2010   Last Assessment & Plan:  Formatting of this note might be different from the original. Continue Dexilant.   GERD (gastroesophageal reflux disease)    Glaucoma    Gout 12/02/2016   High cholesterol    History of ischemic stroke 06/30/2018   Hypertension    Hypertensive heart and kidney disease with heart failure and with chronic kidney disease stage III ([place]) 11/10/2010   Hypertensive heart disease with heart failure ([place]) 10/25/2014   Hypokalemia 06/04/2016   Last Assessment & Plan:  Replace with potassium chloride.   IBS (irritable bowel syndrome)    ICD (implantable cardioverter-defibrillator) in place 10/25/2014   Overview:  Overview:  dual chamber ICD,upgraded to CRT with LBBB and nonischemic cardiomyopathy  Formatting of this note might be different from the original. Overview:  dual chamber ICD,upgraded to CRT  with LBBB and nonischemic cardiomyopathy  Overview:  Overview:  BIV -ICD,upgraded to CRT with LBBB and nonischemic cardiomyopathy  Arturo Morton, CMA - 09/07/2016 3:00 PM EDT REMOTE MONITORING ASSE   LBBB (left bundle branch block) 12/02/2016   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) 05/21/2015   Migraine 12/02/2016   Mild CAD 10/25/2014   Mixed hyperlipidemia 11/10/2010   Last Assessment & Plan:   Formatting of this note might be different from the original. Continue statin therapy.   Nephrolithiasis 12/02/2016   Onychomycosis 11/10/2010   Osteoarthritis    PAD (peripheral artery disease) (HCC) 11/03/2014   Overview:  status post insertion of aortic stent and left common iliac stent by Dr. Imogene Burn in August of 2016   Peripheral arterial occlusive disease (HCC) 11/03/2014   Precordial chest pain 06/04/2016   Last Assessment & Plan:  My suspicion is that this is likely related to gastroesophageal reflux disease with acute indigestion last evening.  The biomarker elevation is not completely unexpected in this elderly patient with reported cardiomyopathy and current demand ventricular pacing.  Nonetheless she does have a history of extensive vascular disease (though apparently no significant coronary atherosclerosis in 2013) so we will proceed with a noninvasive risk stratification with pharmacological nuclear stress testing.  Assuming this is a low risk or normal study then I would not advise any invasive cardiac evaluation at this time.   Preoperative cardiovascular examination 04/02/2015   Rectal bleeding 11/19/2017   Formatting of this note might be different from the original. 2019   Renal artery stenosis (HCC) 12/02/2016   Rheumatoid arthritis involving multiple sites (HCC) 05/21/2015   Screening for breast cancer 04/02/2015   Stroke [facility])    Type 2 diabetes mellitus (HCC) 11/10/2010   Last Assessment & Plan:  Formatting of this note might be different from the original. Continue glipizide and add sliding scale insulin coverage Formatting of this note might be different from the original. Per 01/28/2019 Podiatry VN.   Vitamin D deficiency 06/20/2016   Patient Active Problem List   Diagnosis Date Noted   Urinary tract infection without hematuria 08/18/2021   After cataract not obscuring vision, bilateral 05/21/2021   Dry eye syndrome of both lacrimal glands 05/21/2021   Fuchs' corneal dystrophy of both  eyes 05/21/2021   Meibomian gland dysfunction (MGD) of both eyes 05/21/2021   Pseudophakia of both eyes 05/21/2021   COVID-19 12/20/2020   Urge incontinence of urine 09/08/2020   Hypertension    High cholesterol    GERD (gastroesophageal reflux disease)    Anxiety    Stroke (HCC) 08/07/2019   Dizziness 06/30/2018   History of ischemic stroke 06/30/2018   Rectal bleeding 11/19/2017   Dysuria 06/15/2017   Chronic kidney disease, stage 3 (HCC) 12/05/2016   Gout 12/02/2016   Migraine 12/02/2016   Nephrolithiasis 12/02/2016   LBBB (left bundle branch block) 12/02/2016   Renal artery stenosis (HCC) 12/02/2016   Vitamin D deficiency 06/20/2016   Elevated troponin 06/04/2016   Hypokalemia 06/04/2016   Precordial chest pain 06/04/2016   Essential hypertension 06/04/2016   Rheumatoid arthritis involving multiple sites (HCC) 05/21/2015   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) 05/21/2015   Screening for breast cancer 04/02/2015   Preoperative cardiovascular examination 04/02/2015   PAD (peripheral artery disease) (HCC) 11/03/2014   Peripheral arterial occlusive disease (HCC) 11/03/2014   ICD (implantable cardioverter-defibrillator) in place 10/25/2014   Biventricular ICD (implantable cardioverter-defibrillator) in place 10/25/2014   Chronic combined systolic and diastolic heart  failure ([place]) 10/25/2014   Costochondritis 10/25/2014   Combined systolic and diastolic congestive heart failure ([place]) 10/25/2014   Coronary artery disease 10/25/2014   Mild CAD 10/25/2014   Hypertensive heart disease with heart failure ([place]) 10/25/2014   Chronic coronary artery disease 10/25/2014   Automatic implantable cardioverter-defibrillator in situ 10/25/2014   Mixed hyperlipidemia 11/10/2010   Hypertensive heart and kidney disease with heart failure and with chronic kidney disease stage III ([place]) 11/10/2010   ibs 11/10/2010   Osteoarthritis 11/10/2010   Diverticulitis 11/10/2010    Gastroesophageal reflux disease 11/10/2010   Glaucoma 11/10/2010   Type 2 diabetes mellitus ([place]) 11/10/2010   Onychomycosis 11/10/2010   Past Surgical History:  Procedure Laterality Date   BIV ICD GENERATOR CHANGEOUT N/A 06/23/2019   Procedure: BIV ICD GENERATOR CHANGEOUT;  Surgeon: Constance Haw, MD;  Location: [facility];  Service: Cardiovascular;  Laterality: N/A;   FOOT SURGERY     pacemaker surgery     a-fib and was done july 2014   PERIPHERAL VASCULAR CATHETERIZATION N/A 11/05/2014   Procedure: Abdominal Aortogram;  Surgeon: Conrad Pueblo, MD;  Location: [facility];  Service: Cardiovascular;  Laterality: N/A;   Current Outpatient Medications on File Prior to Visit  Medication Sig Dispense Refill   acetaminophen (TYLENOL) 650 MG CR tablet Take 1,300 mg by mouth in the morning and at bedtime.     acyclovir (ZOVIRAX) 400 MG tablet Take 400 mg by mouth 5 (five) times daily.     atorvastatin (LIPITOR) 40 MG tablet Take 40 mg by mouth at bedtime.      Calcium Carb-Cholecalciferol (CALCIUM HIGH POTENCY/VITAMIN D) 600-5 MG-MCG TABS Take by mouth.     Calcium Carbonate-Vit D-Min (CALCIUM 1200 PO) Take 1 tablet by mouth daily.     carvedilol (COREG) 25 MG tablet Take by mouth.     clopidogrel (PLAVIX) 75 MG tablet TAKE ONE TABLET BY MOUTH EVERY DAY 90 tablet 2   dexlansoprazole (DEXILANT) 60 MG capsule Take by mouth.     Docusate Calcium (STOOL SOFTENER PO) Take 200 mg by mouth at bedtime.     esomeprazole (NEXIUM) 40 MG packet Take 40 mg by mouth daily before breakfast.     Ferrous Sulfate (IRON PO) Take 1 tablet by mouth every morning.     fesoterodine (TOVIAZ) 4 MG TB24 tablet Take 4 mg by mouth daily.     furosemide (LASIX) 80 MG tablet Take 1 tablet (80 mg total) by mouth daily. Take 1 tablet by mouth daily. Take an extra tablet by mouth daily in the evening if you weigh more than 123 lbs. 90 tablet 3   glipiZIDE (GLUCOTROL) 10 MG tablet Take 10 mg by mouth 2 (two)  times daily before a meal.      isosorbide mononitrate (IMDUR) 60 MG 24 hr tablet Take 1 tablet (60 mg total) by mouth daily. 90 tablet 2   JANUVIA 25 MG tablet Take 25 mg by mouth daily.     ketoconazole (NIZORAL) 2 % shampoo Apply 1 application topically 2 (two) times a week.     metoprolol succinate (TOPROL-XL) 25 MG 24 hr tablet TAKE ONE TABLET BY MOUTH DAILY 90 tablet 2   montelukast (SINGULAIR) 10 MG tablet Take by mouth.     Multiple Vitamins-Minerals (THERA-M) TABS Take 1 tablet by mouth daily.     Multiple Vitamins-Minerals (ZINC PO) Take 1 tablet by mouth daily at 12 noon.     neomycin-polymyxin-dexameth (MAXITROL) 0.1 %  OINT Apply [id number] Application Both Eyes at bedtime Use for ten days then stop.     NITROSTAT 0.4 MG SL tablet DISSOLVE 1 TABLET UNDER THE TONGUE AS NEEDED FOR CHEST PAIN. 25 tablet 8   nystatin (MYCOSTATIN) 100000 UNIT/ML suspension Take 5 mLs by mouth 4 (four) times daily.     Omega-3 Fatty Acids (FISH OIL) 1000 MG CPDR Take 1,000 mg by mouth at bedtime.     potassium chloride (KLOR-CON) 10 MEQ tablet Take 1 tablet (10 mEq total) by mouth daily. 90 tablet 1   Prodigy Twist Top Lancets 28G MISC by Does not apply route.     ramipril (ALTACE) 10 MG capsule Take 10 mg by mouth daily.     ranitidine (ZANTAC) 300 MG tablet Take by mouth.     ranolazine (RANEXA) 500 MG 12 hr tablet Take by mouth.     RESTASIS 0.05 % ophthalmic emulsion Place 1 drop into both eyes 2 (two) times daily.     sodium chloride (MURO 128) 5 % ophthalmic solution 1 drop 2 (two) times daily.     terbinafine (LAMISIL) 250 MG tablet Take 250 mg by mouth daily as needed (Dermatitis on scalp).      tolterodine (DETROL LA) 4 MG 24 hr capsule Take 4 mg by mouth daily.     traZODone (DESYREL) 50 MG tablet Take by mouth.     triamcinolone cream (KENALOG) 0.5 % apply TWICE DAILY to itchy spots on scalp FOR 1-2 WEEKS AS NEEDED     VASCEPA 1 g CAPS Take 2 g by mouth 2 (two) times daily.   3   Vitamin D,  Ergocalciferol, (DRISDOL) 50000 units CAPS capsule Take 50,000 Units by mouth every 14 (fourteen) days.      Zinc 50 MG CAPS Take 50 mg by mouth daily.     No current facility-administered medications on file prior to visit.    Allergies  Allergen Reactions   Doxycycline Nausea Only   Metformin Other (See Comments) and Nausea Only   Codeine Diarrhea, Nausea And Vomiting, Other (See Comments) and Hives    All-over body aches, too Other reaction(s): GI intolerance   Dulaglutide Other (See Comments)    Chest pain, N/V   Ivp Dye [Iodinated Contrast Media] Nausea And Vomiting and Other (See Comments)   Metrizamide Nausea And Vomiting and Other (See Comments)   Pioglitazone Other (See Comments)    Has CHF   Social History   Occupational History   Not on file  Tobacco Use   Smoking status: Never    Passive exposure: Never   Smokeless tobacco: Never  Vaping Use   Vaping Use: Never used  Substance and Sexual Activity   Alcohol use: No    Alcohol/week: 0.0 standard drinks of alcohol   Drug use: No   Sexual activity: Not on file   Family History  Problem Relation Age of Onset   Heart attack Maternal Grandmother    Heart attack Maternal Grandfather    Alzheimer's disease Mother    Hyperlipidemia Mother    Arthritis Mother    Bladder Cancer Father    Hyperlipidemia Father    Vitiligo Sister    Arthritis Sister     There is no immunization history on file for this patient.   Review of Systems: Negative except as noted in the HPI.   Objective: There were no vitals filed for this visit.  Shari [name] is a pleasant 87 y.o. female in NAD. AAO X  3.  Vascular Examination: Capillary refill time to digits <5 seconds b/l lower extremities. Faintly palpable DP pulses b/l LE. Faintly palpable PT pulse(s) b/l LE. Pedal hair absent. No pain with calf compression b/l. Lower extremity skin temperature gradient warm to cool. No edema noted b/l LE. No ischemia or gangrene noted b/l LE. No  cyanosis or clubbing noted b/l LE.  Dermatological Examination: Pedal skin thin, shiny and atrophic b/l LE. No open wounds b/l LE. No interdigital macerations noted b/l LE. Toenails 2-5 bilaterally elongated, discolored, dystrophic, thickened, and crumbly with subungual debris and tenderness to dorsal palpation. Anonychia noted bilateral great toes. Nailbed(s) epithelialized.  Hyperkeratotic lesion(s) medial IPJ right great toe.  No erythema, no edema, no drainage, no fluctuance.  Neurological Examination: Protective sensation decreased with 10 gram monofilament b/l. Vibratory sensation intact b/l.  Musculoskeletal Examination: Muscle strength 5/5 to all lower extremity muscle groups bilaterally. HAV with bunion deformity noted b/l LE. Hammertoe deformity noted 2-5 b/l. Patient ambulates independent of any assistive aids.  Footwear Assessment: Does the patient wear appropriate shoes? Yes. Does the patient need inserts/orthotics? No.  Assessment: 1. Pain due to onychomycosis of toenails of both feet   2. Callus   3. Hallux valgus, acquired, bilateral   4. Acquired hammertoes of both feet   5. Type II diabetes mellitus with peripheral circulatory disorder (HCC)   6. Encounter for diabetic foot exam (HCC)     ADA Risk Categorization: High Risk  Patient has one or more of the following: Loss of protective sensation Absent pedal pulses Severe Foot deformity History of foot ulcer  Plan: -Patient was evaluated and treated. All patient's and/or POA's questions/concerns answered on today's visit. -Diabetic foot examination performed today. -Patient to continue soft, supportive shoe gear daily. -Toenails 2-5 bilaterally debrided in length and girth without iatrogenic bleeding with sterile nail nipper and dremel.  -Callus(es) medial IPJ right hallux pared utilizing sterile scalpel blade without complication or incident. Total number debrided =1. -Patient/POA to call should there be  question/concern in the interim. Return in about 3 months (around 12/23/2021).  Freddie Breech, DPM

## 2021-09-23 LAB — CUP PACEART REMOTE DEVICE CHECK
Battery Remaining Longevity: 29 mo
Battery Remaining Percentage: 53 %
Battery Voltage: 2.92 V
Brady Statistic AP VP Percent: 1 %
Brady Statistic AP VS Percent: 1 %
Brady Statistic AS VP Percent: 99 %
Brady Statistic AS VS Percent: 1 %
Brady Statistic RA Percent Paced: 1 %
Date Time Interrogation Session: 20230619020026
HighPow Impedance: 55 Ohm
Implantable Lead Implant Date: 20140702
Implantable Lead Implant Date: 20140702
Implantable Lead Implant Date: 20150730
Implantable Lead Location: 753858
Implantable Lead Location: 753859
Implantable Lead Location: 753860
Implantable Lead Model: 7122
Implantable Pulse Generator Implant Date: 20210322
Lead Channel Impedance Value: 300 Ohm
Lead Channel Impedance Value: 410 Ohm
Lead Channel Impedance Value: 430 Ohm
Lead Channel Pacing Threshold Amplitude: 1 V
Lead Channel Pacing Threshold Amplitude: 1 V
Lead Channel Pacing Threshold Amplitude: 2.5 V
Lead Channel Pacing Threshold Pulse Width: 0.4 ms
Lead Channel Pacing Threshold Pulse Width: 0.5 ms
Lead Channel Pacing Threshold Pulse Width: 1.2 ms
Lead Channel Sensing Intrinsic Amplitude: 1.6 mV
Lead Channel Sensing Intrinsic Amplitude: 12 mV
Lead Channel Setting Pacing Amplitude: 2 V
Lead Channel Setting Pacing Amplitude: 2.5 V
Lead Channel Setting Pacing Amplitude: 3.25 V
Lead Channel Setting Pacing Pulse Width: 0.4 ms
Lead Channel Setting Pacing Pulse Width: 1.2 ms
Lead Channel Setting Sensing Sensitivity: 0.5 mV
Pulse Gen Serial Number: 810001491

## 2021-10-05 NOTE — Progress Notes (Signed)
Remote ICD transmission.   

## 2021-10-10 ENCOUNTER — Telehealth: Payer: Self-pay | Admitting: Cardiology

## 2021-10-10 NOTE — Telephone Encounter (Signed)
  Pt is requesting if Dr. Dulce Sellar can set up DNR for her and her husband. She said, she called their pcp and was told they don't have it there and was told to call Dr. Dulce Sellar

## 2021-10-27 ENCOUNTER — Other Ambulatory Visit: Payer: Self-pay | Admitting: Cardiology

## 2021-10-28 NOTE — Telephone Encounter (Signed)
Called patient 1 week ago to let her know that Dr. Dulce Sellar does not set-up DNR forms and that she would have to contact her PCP to have this done. After I told her this she stated that her husband had just fallen and hit his head and was bleeding on the floor. She also stated that she had called 911 and they were coming to the house to take her husband to the hospital. She also stated that her PCP does not set-up the DNR forms either. At this point I advised that she reach back out to [place] after her husband got out of the hospital. She agreed and had no further questions at this time.

## 2021-11-15 ENCOUNTER — Other Ambulatory Visit: Payer: Self-pay | Admitting: Cardiology

## 2021-12-09 ENCOUNTER — Ambulatory Visit: Payer: Medicare PPO | Admitting: Cardiology

## 2021-12-19 LAB — CUP PACEART REMOTE DEVICE CHECK
Battery Remaining Longevity: 26 mo
Battery Remaining Percentage: 49 %
Battery Voltage: 2.92 V
Brady Statistic AP VP Percent: 1 %
Brady Statistic AP VS Percent: 1 %
Brady Statistic AS VP Percent: 99 %
Brady Statistic AS VS Percent: 1 %
Brady Statistic RA Percent Paced: 1 %
Date Time Interrogation Session: 20230918020152
HighPow Impedance: 51 Ohm
Implantable Lead Implant Date: 20140702
Implantable Lead Implant Date: 20140702
Implantable Lead Implant Date: 20150730
Implantable Lead Location: 753858
Implantable Lead Location: 753859
Implantable Lead Location: 753860
Implantable Lead Model: 7122
Implantable Pulse Generator Implant Date: 20210322
Lead Channel Impedance Value: 310 Ohm
Lead Channel Impedance Value: 430 Ohm
Lead Channel Impedance Value: 430 Ohm
Lead Channel Pacing Threshold Amplitude: 1 V
Lead Channel Pacing Threshold Amplitude: 1 V
Lead Channel Pacing Threshold Amplitude: 2.5 V
Lead Channel Pacing Threshold Pulse Width: 0.4 ms
Lead Channel Pacing Threshold Pulse Width: 0.5 ms
Lead Channel Pacing Threshold Pulse Width: 1.2 ms
Lead Channel Sensing Intrinsic Amplitude: 1.4 mV
Lead Channel Sensing Intrinsic Amplitude: 9.7 mV
Lead Channel Setting Pacing Amplitude: 2 V
Lead Channel Setting Pacing Amplitude: 2.5 V
Lead Channel Setting Pacing Amplitude: 3.25 V
Lead Channel Setting Pacing Pulse Width: 0.4 ms
Lead Channel Setting Pacing Pulse Width: 1.2 ms
Lead Channel Setting Sensing Sensitivity: 0.5 mV
Pulse Gen Serial Number: 810001491

## 2021-12-21 ENCOUNTER — Ambulatory Visit (INDEPENDENT_AMBULATORY_CARE_PROVIDER_SITE_OTHER): Payer: Medicare PPO

## 2021-12-21 DIAGNOSIS — I428 Other cardiomyopathies: Secondary | ICD-10-CM | POA: Diagnosis not present

## 2021-12-29 ENCOUNTER — Ambulatory Visit: Payer: Medicare PPO | Admitting: Podiatry

## 2021-12-29 ENCOUNTER — Encounter: Payer: Self-pay | Admitting: Podiatry

## 2021-12-29 DIAGNOSIS — E1151 Type 2 diabetes mellitus with diabetic peripheral angiopathy without gangrene: Secondary | ICD-10-CM

## 2021-12-29 DIAGNOSIS — M79674 Pain in right toe(s): Secondary | ICD-10-CM | POA: Diagnosis not present

## 2021-12-29 DIAGNOSIS — L84 Corns and callosities: Secondary | ICD-10-CM

## 2021-12-29 DIAGNOSIS — M79675 Pain in left toe(s): Secondary | ICD-10-CM

## 2021-12-29 DIAGNOSIS — B351 Tinea unguium: Secondary | ICD-10-CM

## 2021-12-31 NOTE — Progress Notes (Signed)
  Subjective:  Patient ID: Shari [name], female    DOB: 02-22-1935,  MRN: [id number]  Steph Cheadle Searson presents to clinic today for:  Chief Complaint  Patient presents with   Nail Problem    Diabetic foot care   Blood glucose reading was 153 mg/dl today. Last A1c was 6.8%.  New problem(s): None.   PCP is Algis Greenhouse, MD , and last visit was  September 15, 2021.  Allergies  Allergen Reactions   Doxycycline Nausea Only   Metformin Other (See Comments) and Nausea Only   Codeine Diarrhea, Nausea And Vomiting, Other (See Comments) and Hives    All-over body aches, too Other reaction(s): GI intolerance   Dulaglutide Other (See Comments)    Chest pain, N/V   Ivp Dye [Iodinated Contrast Media] Nausea And Vomiting and Other (See Comments)   Metrizamide Nausea And Vomiting and Other (See Comments)   Pioglitazone Other (See Comments)    Has CHF    Review of Systems: Negative except as noted in the HPI.  Objective: No changes noted in today's physical examination.  Shari [name] is a pleasant 87 y.o. female WD, WN in NAD. AAO x 3.  Vascular Examination: Capillary refill time to digits <5 seconds b/l lower extremities. Faintly palpable DP pulses b/l LE. Faintly palpable PT pulse(s) b/l LE. Pedal hair absent. No pain with calf compression b/l. Lower extremity skin temperature gradient warm to cool. No edema noted b/l LE. No ischemia or gangrene noted b/l LE. No cyanosis or clubbing noted b/l LE.  Dermatological Examination: Pedal skin thin, shiny and atrophic b/l LE. No open wounds b/l LE. No interdigital macerations noted b/l LE. Toenails 2-5 bilaterally elongated, discolored, dystrophic, thickened, and crumbly with subungual debris and tenderness to dorsal palpation. Anonychia noted bilateral great toes. Nailbed(s) epithelialized.    She does have peeling skin on the lateral aspect of the right hallux most likely from chronic silicone pad wear.  Hyperkeratotic lesion(s) medial IPJ right  great toe, 5th toe left foot.  No erythema, no edema, no drainage, no fluctuance.  Neurological Examination: Protective sensation decreased with 10 gram monofilament b/l. Vibratory sensation intact b/l.  Musculoskeletal Examination: Muscle strength 5/5 to all lower extremity muscle groups bilaterally. HAV with bunion deformity noted b/l LE. Hammertoe deformity noted 2-5 b/l. Patient ambulates independent of any assistive aids.  Assessment/Plan: 1. Pain due to onychomycosis of toenails of both feet   2. Corns and callosities   3. Type II diabetes mellitus with peripheral circulatory disorder (HCC)     No orders of the defined types were placed in this encounter.   -Consent given for treatment as described below: -Examined patient. -For peeling skin right hallux, I have asked her to discontinue the silicone padding. Start tube foam right hallux. -Toenails 2-5 bilaterally debrided in length and girth without iatrogenic bleeding with sterile nail nipper and dremel.  -Corn(s) L 5th toe and callus(es) medial IPJ of right great toe were pared utilizing sterile scalpel blade without incident. Total number debrided =2. -Patient/POA to call should there be question/concern in the interim.   Return in about 3 months (around 03/30/2022).  Marzetta Board, DPM

## 2022-01-03 NOTE — Progress Notes (Signed)
Remote ICD transmission.   

## 2022-01-17 ENCOUNTER — Other Ambulatory Visit: Payer: Self-pay | Admitting: Cardiology

## 2022-01-23 ENCOUNTER — Other Ambulatory Visit: Payer: Self-pay | Admitting: Cardiology

## 2022-01-24 ENCOUNTER — Ambulatory Visit: Payer: Medicare PPO | Attending: Cardiology | Admitting: Cardiology

## 2022-01-24 ENCOUNTER — Encounter: Payer: Self-pay | Admitting: Cardiology

## 2022-01-24 VITALS — BP 120/58 | HR 66 | Ht 62.0 in | Wt 115.8 lb

## 2022-01-24 DIAGNOSIS — I428 Other cardiomyopathies: Secondary | ICD-10-CM

## 2022-01-24 DIAGNOSIS — Z95810 Presence of automatic (implantable) cardiac defibrillator: Secondary | ICD-10-CM | POA: Diagnosis not present

## 2022-01-24 DIAGNOSIS — I739 Peripheral vascular disease, unspecified: Secondary | ICD-10-CM

## 2022-01-24 DIAGNOSIS — I130 Hypertensive heart and chronic kidney disease with heart failure and stage 1 through stage 4 chronic kidney disease, or unspecified chronic kidney disease: Secondary | ICD-10-CM

## 2022-01-24 DIAGNOSIS — N183 Chronic kidney disease, stage 3 unspecified: Secondary | ICD-10-CM

## 2022-01-24 DIAGNOSIS — I2510 Atherosclerotic heart disease of native coronary artery without angina pectoris: Secondary | ICD-10-CM | POA: Diagnosis not present

## 2022-01-24 DIAGNOSIS — E785 Hyperlipidemia, unspecified: Secondary | ICD-10-CM

## 2022-01-24 MED ORDER — ATORVASTATIN CALCIUM 40 MG PO TABS: 40.0000 mg | ORAL_TABLET | ORAL | 3 refills | Status: DC

## 2022-01-24 NOTE — Progress Notes (Signed)
Cardiology Office Note:    Date:  01/24/2022   ID:  Shari [name], DOB April 15, 1934, MRN [id number]  PCP:  Algis Greenhouse, MD  Cardiologist:  Shirlee More, MD    Referring MD: Algis Greenhouse, MD    ASSESSMENT:    1. Nonischemic cardiomyopathy ([place])   2. Hypertensive heart and kidney disease with heart failure and with chronic kidney disease stage III ([place])   3. Biventricular ICD (implantable cardioverter-defibrillator) in place   4. Mild CAD   5. Dyslipidemia   6. PAD (peripheral artery disease) ([facility])    PLAN:    In order of problems listed above:  Overall is done well with her cardiomyopathy and heart failure her ejection fraction is normalized with biventricular pacing with left bundle branch block and guideline directed therapy she has no fluid overload she will continue her current loop diuretic as well as beta-blocker and ACE inhibitor Stable device therapy followed in our device clinic Stable CAD she had typical angina despite mild nonobstructive CAD and has done well with treatment for coronary microvascular disease we will continue including oral nitrate beta-blocker and low-dose ranolazine Lipids are at target she is having muscle cramping will reduce the frequency rate atorvastatin next prescription continue the vascepa Stable PAD on recent vascular testing   Next appointment: 6 months   Medication Adjustments/Labs and Tests Ordered: Current medicines are reviewed at length with the patient today.  Concerns regarding medicines are outlined above.  No orders of the defined types were placed in this encounter.  Meds ordered this encounter  Medications   atorvastatin (LIPITOR) 40 MG tablet    Sig: Take 1 tablet (40 mg total) by mouth 3 (three) times a week. Take 40 mg on Monday, Wednesday and friday    Dispense:  48 tablet    Refill:  3    Chief Complaint  Patient presents with   Follow-up   Cardiomyopathy    History of Present Illness:    DANDREA [name]  is a 87 y.o. female with a hx of nonischemic cardiomyopathy due to left bundle branch block with CRT-D therapy and normalization of ejection fraction, mild nonobstructive CAD hypertensive heart disease with stage III CKD dyslipidemia and peripheral arterial disease with previous PCI and stent left common carotid artery last seen 06/08/2021.  She follows with nephrology Shari [name] with stable stage IIIa chronic kidney disease.  She follows with [facility] for corneal endothelial dystrophy and anticipates surgical intervention.  Compliance with diet, lifestyle and medications: Yes  She had multiple studies performed including: Echocardiogram 06/20/2021 with normal ejection fraction 55 to 60% and moderate LVH.  The right ventricle is normal size function and pulmonary artery pressure.  There is mild mitral and trivial aortic regurgitation.  1. Left ventricular ejection fraction, by estimation, is 55 to 60%. The  left ventricle has normal function. The left ventricle has no regional  wall motion abnormalities. There is moderate concentric left ventricular  hypertrophy. Left ventricular  diastolic parameters are consistent with Grade I diastolic dysfunction  (impaired relaxation). The average left ventricular global longitudinal  strain is -9.1 %. The global longitudinal strain is abnormal.   2. Right ventricular systolic function is normal. The right ventricular  size is normal. There is normal pulmonary artery systolic pressure.   3. The pericardial effusion is posterior to the left ventricle.   4. The mitral valve is normal in structure. Mild mitral valve  regurgitation. No evidence of mitral stenosis.  5. The aortic valve is normal in structure. Aortic valve regurgitation is  trivial. Aortic valve sclerosis is present, with no evidence of aortic  valve stenosis.   6. The inferior vena cava is normal in size with greater than 50%  respiratory variability, suggesting  right atrial pressure of 3 mmHg.   Duplex abdominal aorta 06/20/2021 showed normal dimensions largest diameter 2 cm.  There was less than 50% left common iliac artery stenosis however no evidence of restenosis after stenting. Summary:  Abdominal Aorta: No evidence of an abdominal aortic aneurysm was  visualized. The largest aortic measurement is 2.0 cm. The largest aortic  diameter remains essentially unchanged compared to prior exam. Previous  diameter measurement was obtained on  06/22/20.  Stenosis: +-----------------+-------------+  Location         Stenosis       +-----------------+-------------+  Left Common Iliac<50% stenosis   Carotid duplex reported 06/29/2021 showed no carotid vertebral or subclavian stenosis. Summary:  Right Carotid: There is no evidence of stenosis in the right ICA.  Left Carotid: There is no evidence of stenosis in the left ICA.  Vertebrals:  Bilateral vertebral arteries demonstrate antegrade flow.  Subclavians: Normal flow hemodynamics were seen in bilateral subclavian  arteries.   Recent labs [facility] PCP 09/15/2021: CMP with sodium 142 potassium 4.0 creatinine 1.09 GFR 50 cc/min. Cholesterol 155 LDL direct 45 triglyceride 395 HDL 36 Hemoglobin 12.3 platelets mildly diminished 121,000 HgbA1c 7.4% 09/15/2021  I reviewed her vascular studies with her She is having leg cramps next prescription will reduce her statin to 3 days a week She is somewhat overwhelmed by caring for her husband but has had no chest pain edema shortness of breath palpitation or syncope Her home blood pressure runs 130 and 145/60-70 Past Medical History:  Diagnosis Date   Anxiety    Automatic implantable cardioverter-defibrillator in situ 10/25/2014   Overview:  Overview:  dual chamber ICD,upgraded to CRT with LBBB and nonischemic cardiomyopathy   Biventricular ICD (implantable cardioverter-defibrillator) in place 10/25/2014   Overview:  BIV -ICD,upgraded to  CRT with LBBB and nonischemic cardiomyopathy   Chronic combined systolic and diastolic heart failure ([place]) 10/25/2014   Overview:  EF 77% by MUGA 06/15/15  Formatting of this note might be different from the original. Overview:  EF 66% by MUGA 03/12/14  Overview:  Overview:  EF 77% by MUGA 06/15/15  Overview:  EF 66% by MUGA 03/12/14  Overview:  Overview:  EF 77% by MUGA 06/15/15 Formatting of this note might be different from the original. EF 66% by MUGA 03/12/14   Chronic coronary artery disease 10/25/2014   Chronic kidney disease, stage 3 ([place]) 12/05/2016   Combined systolic and diastolic congestive heart failure ([place]) 10/25/2014   Last Assessment & Plan:  Formatting of this note might be different from the original. Type and degree unknown though patient currently appears to be euvolemic.   Coronary artery disease 10/25/2014   Costochondritis 10/25/2014   Diabetes mellitus 2 years   Diverticulitis    Dizziness 06/30/2018   Last Assessment & Plan:  Formatting of this note might be different from the original. 87 yo F with history of CHF, DM, PVD, h/o TIA with double vision in 2013, HTN, HLD.   Yesterday morning she reports that when she woke up and looked over at her clock it looked blurry.  She denies double vision, facial droop or extremity weakness.  When she tried to get up to ambulate she reports that  she felt d   Dysuria 06/15/2017   Elevated troponin 06/04/2016   Last Assessment & Plan:  Likely may be related to ventricular pacing and underlying cardiomyopathy.  The trajectory of the biomarker elevation and neither is her clinical history suggestive of acute coronary syndrome.  Further stratification with nuclear stress testing   Essential hypertension 06/04/2016   Last Assessment & Plan:  Formatting of this note might be different from the original. Continue carvedilol   Gastroesophageal reflux disease 11/10/2010   Last Assessment & Plan:  Formatting of this note might be different from the original.  Continue Dexilant.   GERD (gastroesophageal reflux disease)    Glaucoma    Gout 12/02/2016   High cholesterol    History of ischemic stroke 06/30/2018   Hypertension    Hypertensive heart and kidney disease with heart failure and with chronic kidney disease stage III ([place]) 11/10/2010   Hypertensive heart disease with heart failure ([place]) 10/25/2014   Hypokalemia 06/04/2016   Last Assessment & Plan:  Replace with potassium chloride.   IBS (irritable bowel syndrome)    ICD (implantable cardioverter-defibrillator) in place 10/25/2014   Overview:  Overview:  dual chamber ICD,upgraded to CRT with LBBB and nonischemic cardiomyopathy  Formatting of this note might be different from the original. Overview:  dual chamber ICD,upgraded to CRT with LBBB and nonischemic cardiomyopathy  Overview:  Overview:  BIV -ICD,upgraded to CRT with LBBB and nonischemic cardiomyopathy  Burna Forts, CMA - 09/07/2016 3:00 PM EDT REMOTE MONITORING ASSE   LBBB (left bundle branch block) 12/02/2016   Malignant hypertension with congestive heart failure ([place]) 05/21/2015   Migraine 12/02/2016   Mild CAD 10/25/2014   Mixed hyperlipidemia 11/10/2010   Last Assessment & Plan:  Formatting of this note might be different from the original. Continue statin therapy.   Nephrolithiasis 12/02/2016   Onychomycosis 11/10/2010   Osteoarthritis    PAD (peripheral artery disease) ([place]) 11/03/2014   Overview:  status post insertion of aortic stent and left common iliac stent by Dr. Bridgett Larsson in August of 2016   Peripheral arterial occlusive disease ([place]) 11/03/2014   Precordial chest pain 06/04/2016   Last Assessment & Plan:  My suspicion is that this is likely related to gastroesophageal reflux disease with acute indigestion last evening.  The biomarker elevation is not completely unexpected in this elderly patient with reported cardiomyopathy and current demand ventricular pacing.  Nonetheless she does have a history of extensive vascular disease (though apparently  no significant coronary atherosclerosis in 2013) so we will proceed with a noninvasive risk stratification with pharmacological nuclear stress testing.  Assuming this is a low risk or normal study then I would not advise any invasive cardiac evaluation at this time.   Preoperative cardiovascular examination 04/02/2015   Rectal bleeding 11/19/2017   Formatting of this note might be different from the original. 2019   Renal artery stenosis ([place]) 12/02/2016   Rheumatoid arthritis involving multiple sites ([place]) 05/21/2015   Screening for breast cancer 04/02/2015   Stroke [facility])    Type 2 diabetes mellitus ([place]) 11/10/2010   Last Assessment & Plan:  Formatting of this note might be different from the original. Continue glipizide and add sliding scale insulin coverage Formatting of this note might be different from the original. Per 01/28/2019 Podiatry VN.   Vitamin D deficiency 06/20/2016    Past Surgical History:  Procedure Laterality Date   BIV ICD GENERATOR CHANGEOUT N/A 06/23/2019   Procedure: BIV ICD GENERATOR CHANGEOUT;  Surgeon: Curt Bears,  Ocie Doyne, MD;  Location: [place] [facility];  Service: Cardiovascular;  Laterality: N/A;   FOOT SURGERY     pacemaker surgery     a-fib and was done july 2014   PERIPHERAL VASCULAR CATHETERIZATION N/A 11/05/2014   Procedure: Abdominal Aortogram;  Surgeon: Conrad , MD;  Location: [place] [facility];  Service: Cardiovascular;  Laterality: N/A;    Current Medications: Current Meds  Medication Sig   acetaminophen (TYLENOL) 500 MG tablet Take 1,000 mg by mouth 2 (two) times daily.   Calcium Carb-Cholecalciferol (CALCIUM HIGH POTENCY/VITAMIN D) 600-5 MG-MCG TABS Take by mouth.   Calcium Carbonate-Vit D-Min (CALCIUM 1200 PO) Take 1 tablet by mouth daily.   clopidogrel (PLAVIX) 75 MG tablet TAKE ONE TABLET BY MOUTH EVERY DAY   dexlansoprazole (DEXILANT) 60 MG capsule Take by mouth.   Docusate Calcium (STOOL SOFTENER PO) Take 200 mg by mouth at bedtime.    esomeprazole (NEXIUM) 40 MG packet Take 40 mg by mouth daily before breakfast.   furosemide (LASIX) 80 MG tablet 1 tablet (80 mg total) by mouth daily. Take 1 tablet by mouth daily. Take an extra tablet by mouth daily in the evening if you weigh more than 123 lbs. - Oral   glipiZIDE (GLUCOTROL) 10 MG tablet Take 10 mg by mouth 2 (two) times daily before a meal.    isosorbide mononitrate (IMDUR) 60 MG 24 hr tablet Take 1 tablet (60 mg total) by mouth daily.   JANUVIA 25 MG tablet Take 25 mg by mouth daily.   ketoconazole (NIZORAL) 2 % shampoo Apply 1 application topically 2 (two) times a week.   metoprolol succinate (TOPROL-XL) 25 MG 24 hr tablet Take 1 tablet (25 mg total) by mouth daily.   montelukast (SINGULAIR) 10 MG tablet Take by mouth.   Multiple Vitamins-Minerals (THERA-M) TABS Take 1 tablet by mouth daily.   Multiple Vitamins-Minerals (ZINC PO) Take 1 tablet by mouth daily at 12 noon.   NITROSTAT 0.4 MG SL tablet DISSOLVE 1 TABLET UNDER THE TONGUE AS NEEDED FOR CHEST PAIN.   Omega-3 Fatty Acids (FISH OIL) 1000 MG CPDR Take 1,000 mg by mouth at bedtime.   potassium chloride (KLOR-CON) 10 MEQ tablet Take 1 tablet (10 mEq total) by mouth daily.   Prodigy Twist Top Lancets 28G MISC by Does not apply route.   ramipril (ALTACE) 10 MG capsule Take 10 mg by mouth daily.   ranitidine (ZANTAC) 300 MG tablet Take by mouth.   ranolazine (RANEXA) 500 MG 12 hr tablet Take by mouth.   RESTASIS 0.05 % ophthalmic emulsion Place 1 drop into both eyes 2 (two) times daily.   sodium chloride (MURO 128) 5 % ophthalmic solution 1 drop 2 (two) times daily.   terbinafine (LAMISIL) 250 MG tablet Take 250 mg by mouth daily as needed (Dermatitis on scalp).    tolterodine (DETROL LA) 4 MG 24 hr capsule Take 4 mg by mouth daily.   traZODone (DESYREL) 50 MG tablet Take by mouth.   triamcinolone cream (KENALOG) 0.5 % apply TWICE DAILY to itchy spots on scalp FOR 1-2 WEEKS AS NEEDED   VASCEPA 1 g CAPS Take 2 g by  mouth 2 (two) times daily.    Vitamin D, Ergocalciferol, (DRISDOL) 50000 units CAPS capsule Take 50,000 Units by mouth every 14 (fourteen) days.    Zinc 50 MG CAPS Take 50 mg by mouth daily.   [DISCONTINUED] atorvastatin (LIPITOR) 40 MG tablet Take 40 mg by mouth 3 (three) times a  week. Take 40 mg on Monday, Wednesday and friday     Allergies:   Doxycycline, Metformin, Codeine, Dulaglutide, Ivp dye [iodinated contrast media], Metrizamide, and Pioglitazone   Social History   Socioeconomic History   Marital status: Married    Spouse name: Not on file   Number of children: Not on file   Years of education: Not on file   Highest education level: Not on file  Occupational History   Not on file  Tobacco Use   Smoking status: Never    Passive exposure: Never   Smokeless tobacco: Never  Vaping Use   Vaping Use: Never used  Substance and Sexual Activity   Alcohol use: No    Alcohol/week: 0.0 standard drinks of alcohol   Drug use: No   Sexual activity: Not on file  Other Topics Concern   Not on file  Social History Narrative   Not on file   Social Determinants of Health   Financial Resource Strain: Not on file  Food Insecurity: Not on file  Transportation Needs: Not on file  Physical Activity: Not on file  Stress: Not on file  Social Connections: Not on file     Family History: The patient's family history includes Alzheimer's disease in her mother; Arthritis in her mother and sister; Bladder Cancer in her father; Heart attack in her maternal grandfather and maternal grandmother; Hyperlipidemia in her father and mother; Vitiligo in her sister. ROS:   Please see the history of present illness.    All other systems reviewed and are negative.  EKGs/Labs/Other Studies Reviewed:    The following studies were reviewed today: See history  Physical Exam:    VS:  BP (!) 120/58 (BP Location: Left Arm, Patient Position: Sitting)   Pulse 66   Ht 5\' 2"  (1.575 m)   Wt 115 lb 12.8  oz (52.5 kg)   SpO2 98%   BMI 21.18 kg/m     Wt Readings from Last 3 Encounters:  01/24/22 115 lb 12.8 oz (52.5 kg)  06/13/21 120 lb 3.2 oz (54.5 kg)  06/08/21 119 lb 6.4 oz (54.2 kg)     GEN: Appears her age well nourished, well developed in no acute distress HEENT: Normal NECK: No JVD; No carotid bruits LYMPHATICS: No lymphadenopathy CARDIAC: RRR, no murmurs, rubs, gallops RESPIRATORY:  Clear to auscultation without rales, wheezing or rhonchi  ABDOMEN: Soft, non-tender, non-distended MUSCULOSKELETAL:  No edema; No deformity  SKIN: Warm and dry NEUROLOGIC:  Alert and oriented x 3 PSYCHIATRIC:  Normal affect    Signed, Shirlee More, MD  01/24/2022 3:15 PM    [facility]

## 2022-01-24 NOTE — Patient Instructions (Addendum)
Medication Instructions:  Your physician has recommended you make the following change in your medication:   Decrease your Atorvastatin to three times weekly.  *If you need a refill on your cardiac medications before your next appointment, please call your pharmacy*   Lab Work: None ordered If you have labs (blood work) drawn today and your tests are completely normal, you will receive your results only by: [place] (if you have MyChart) OR A paper copy in the mail If you have any lab test that is abnormal or we need to change your treatment, we will call you to review the results.   Testing/Procedures: None ordered   Follow-Up: At [facility], you and your health needs are our priority.  As part of our continuing mission to provide you with exceptional heart care, we have created designated Provider Care Teams.  These Care Teams include your primary Cardiologist (physician) and Advanced Practice Providers (APPs -  Physician Assistants and Nurse Practitioners) who all work together to provide you with the care you need, when you need it.  We recommend signing up for the patient portal called "MyChart".  Sign up information is provided on this After Visit Summary.  MyChart is used to connect with patients for Virtual Visits (Telemedicine).  Patients are able to view lab/test results, encounter notes, upcoming appointments, etc.  Non-urgent messages can be sent to your provider as well.   To learn more about what you can do with MyChart, go to [email or web address].    Your next appointment:   6 month(s)  The format for your next appointment:   In Person  Provider:   Jyl Heinz, MD   Other Instructions NA

## 2022-03-08 ENCOUNTER — Other Ambulatory Visit: Payer: Self-pay

## 2022-03-08 ENCOUNTER — Other Ambulatory Visit: Payer: Self-pay | Admitting: Cardiology

## 2022-03-08 ENCOUNTER — Telehealth: Payer: Self-pay | Admitting: Cardiology

## 2022-03-08 NOTE — Telephone Encounter (Signed)
Called [facility] and verified the dosage and instructions for the patient's Furosemide. [facility] had no further questions at this time.

## 2022-03-08 NOTE — Telephone Encounter (Signed)
Pt c/o medication issue:  1. Name of Medication: furosemide (LASIX) 80 MG tablet   2. How are you currently taking this medication (dosage and times per day)?   1 tablet (80 mg total) by mouth daily. Take 1 tablet by mouth daily. Take an extra tablet by mouth daily in the evening if you weigh more than 123 lbs. - Oral    3. Are you having a reaction (difficulty breathing--STAT)? no  4. What is your medication issue? Pharmacy calling to verify patient dosage. Patient states that the dr has change it. Please advise

## 2022-03-22 ENCOUNTER — Ambulatory Visit (INDEPENDENT_AMBULATORY_CARE_PROVIDER_SITE_OTHER): Payer: Medicare PPO

## 2022-03-22 DIAGNOSIS — I428 Other cardiomyopathies: Secondary | ICD-10-CM | POA: Diagnosis not present

## 2022-03-22 LAB — CUP PACEART REMOTE DEVICE CHECK
Battery Remaining Longevity: 24 mo
Battery Remaining Percentage: 44 %
Battery Voltage: 2.92 V
Brady Statistic AP VP Percent: 1 %
Brady Statistic AP VS Percent: 1 %
Brady Statistic AS VP Percent: 99 %
Brady Statistic AS VS Percent: 1 %
Brady Statistic RA Percent Paced: 1 %
Date Time Interrogation Session: 20231220020041
HighPow Impedance: 52 Ohm
Implantable Lead Connection Status: 753985
Implantable Lead Connection Status: 753985
Implantable Lead Connection Status: 753985
Implantable Lead Implant Date: 20140702
Implantable Lead Implant Date: 20140702
Implantable Lead Implant Date: 20150730
Implantable Lead Location: 753858
Implantable Lead Location: 753859
Implantable Lead Location: 753860
Implantable Lead Model: 7122
Implantable Pulse Generator Implant Date: 20210322
Lead Channel Impedance Value: 300 Ohm
Lead Channel Impedance Value: 400 Ohm
Lead Channel Impedance Value: 410 Ohm
Lead Channel Pacing Threshold Amplitude: 1 V
Lead Channel Pacing Threshold Amplitude: 1 V
Lead Channel Pacing Threshold Amplitude: 2.5 V
Lead Channel Pacing Threshold Pulse Width: 0.4 ms
Lead Channel Pacing Threshold Pulse Width: 0.5 ms
Lead Channel Pacing Threshold Pulse Width: 1.2 ms
Lead Channel Sensing Intrinsic Amplitude: 1.4 mV
Lead Channel Sensing Intrinsic Amplitude: 9.7 mV
Lead Channel Setting Pacing Amplitude: 2 V
Lead Channel Setting Pacing Amplitude: 2.5 V
Lead Channel Setting Pacing Amplitude: 3.25 V
Lead Channel Setting Pacing Pulse Width: 0.4 ms
Lead Channel Setting Pacing Pulse Width: 1.2 ms
Lead Channel Setting Sensing Sensitivity: 0.5 mV
Pulse Gen Serial Number: 810001491
Zone Setting Status: 755011

## 2022-04-05 ENCOUNTER — Other Ambulatory Visit: Payer: Self-pay | Admitting: Cardiology

## 2022-04-05 NOTE — Telephone Encounter (Signed)
Refill sent to [facility], [place]

## 2022-04-06 ENCOUNTER — Ambulatory Visit: Payer: Medicare PPO | Admitting: Podiatry

## 2022-04-12 DIAGNOSIS — H00011 Hordeolum externum right upper eyelid: Secondary | ICD-10-CM

## 2022-04-12 DIAGNOSIS — M792 Neuralgia and neuritis, unspecified: Secondary | ICD-10-CM

## 2022-04-12 HISTORY — DX: Neuralgia and neuritis, unspecified: M79.2

## 2022-04-12 HISTORY — DX: Hordeolum externum right upper eyelid: H00.011

## 2022-04-13 ENCOUNTER — Ambulatory Visit: Payer: Medicare PPO | Admitting: Podiatry

## 2022-04-14 NOTE — Progress Notes (Signed)
Remote ICD transmission.   

## 2022-04-23 ENCOUNTER — Other Ambulatory Visit: Payer: Self-pay | Admitting: Cardiology

## 2022-04-23 DIAGNOSIS — I739 Peripheral vascular disease, unspecified: Secondary | ICD-10-CM

## 2022-04-26 ENCOUNTER — Encounter: Payer: Self-pay | Admitting: Podiatry

## 2022-04-26 ENCOUNTER — Ambulatory Visit: Payer: Medicare PPO | Admitting: Podiatry

## 2022-04-26 DIAGNOSIS — M79675 Pain in left toe(s): Secondary | ICD-10-CM | POA: Diagnosis not present

## 2022-04-26 DIAGNOSIS — E1140 Type 2 diabetes mellitus with diabetic neuropathy, unspecified: Secondary | ICD-10-CM

## 2022-04-26 DIAGNOSIS — I739 Peripheral vascular disease, unspecified: Secondary | ICD-10-CM

## 2022-04-26 DIAGNOSIS — B351 Tinea unguium: Secondary | ICD-10-CM

## 2022-04-26 DIAGNOSIS — L84 Corns and callosities: Secondary | ICD-10-CM

## 2022-04-26 DIAGNOSIS — M79674 Pain in right toe(s): Secondary | ICD-10-CM

## 2022-04-26 NOTE — Progress Notes (Signed)
Subjective:  Patient ID: Shari [name], female    DOB: 1934/09/25,   MRN: [id number]  Chief Complaint  Patient presents with   Nail Problem    [facility]    88 y.o. female presents for concern of thickened elongated and painful nails that are difficult to trim. Requesting to have them trimmed today. Relates burning and tingling in their feet. Patient is diabetic and last A1c was 7.4 on 03/30/22.   PCP:  Olive Bass, MD    . Denies any other pedal complaints. Denies n/v/f/c.   Past Medical History:  Diagnosis Date   Anxiety    Automatic implantable cardioverter-defibrillator in situ 10/25/2014   Overview:  Overview:  dual chamber ICD,upgraded to CRT with LBBB and nonischemic cardiomyopathy   Biventricular ICD (implantable cardioverter-defibrillator) in place 10/25/2014   Overview:  BIV -ICD,upgraded to CRT with LBBB and nonischemic cardiomyopathy   Chronic combined systolic and diastolic heart failure (HCC) 10/25/2014   Overview:  EF 77% by MUGA 06/15/15  Formatting of this note might be different from the original. Overview:  EF 66% by MUGA 03/12/14  Overview:  Overview:  EF 77% by MUGA 06/15/15  Overview:  EF 66% by MUGA 03/12/14  Overview:  Overview:  EF 77% by MUGA 06/15/15 Formatting of this note might be different from the original. EF 66% by MUGA 03/12/14   Chronic coronary artery disease 10/25/2014   Chronic kidney disease, stage 3 (HCC) 12/05/2016   Combined systolic and diastolic congestive heart failure (HCC) 10/25/2014   Last Assessment & Plan:  Formatting of this note might be different from the original. Type and degree unknown though patient currently appears to be euvolemic.   Coronary artery disease 10/25/2014   Costochondritis 10/25/2014   Diabetes mellitus 2 years   Diverticulitis    Dizziness 06/30/2018   Last Assessment & Plan:  Formatting of this note might be different from the original. 88 yo F with history of CHF, DM, PVD, h/o TIA with double vision in 2013, HTN, HLD.    Yesterday morning she reports that when she woke up and looked over at her clock it looked blurry.  She denies double vision, facial droop or extremity weakness.  When she tried to get up to ambulate she reports that she felt d   Dysuria 06/15/2017   Elevated troponin 06/04/2016   Last Assessment & Plan:  Likely may be related to ventricular pacing and underlying cardiomyopathy.  The trajectory of the biomarker elevation and neither is her clinical history suggestive of acute coronary syndrome.  Further stratification with nuclear stress testing   Essential hypertension 06/04/2016   Last Assessment & Plan:  Formatting of this note might be different from the original. Continue carvedilol   Gastroesophageal reflux disease 11/10/2010   Last Assessment & Plan:  Formatting of this note might be different from the original. Continue Dexilant.   GERD (gastroesophageal reflux disease)    Glaucoma    Gout 12/02/2016   High cholesterol    History of ischemic stroke 06/30/2018   Hypertension    Hypertensive heart and kidney disease with heart failure and with chronic kidney disease stage III (HCC) 11/10/2010   Hypertensive heart disease with heart failure (HCC) 10/25/2014   Hypokalemia 06/04/2016   Last Assessment & Plan:  Replace with potassium chloride.   IBS (irritable bowel syndrome)    ICD (implantable cardioverter-defibrillator) in place 10/25/2014   Overview:  Overview:  dual chamber ICD,upgraded to CRT with LBBB and nonischemic cardiomyopathy  Formatting of this note might be different from the original. Overview:  dual chamber ICD,upgraded to CRT with LBBB and nonischemic cardiomyopathy  Overview:  Overview:  BIV -ICD,upgraded to CRT with LBBB and nonischemic cardiomyopathy  Burna Forts, CMA - 09/07/2016 3:00 PM EDT REMOTE MONITORING ASSE   LBBB (left bundle branch block) 12/02/2016   Malignant hypertension with congestive heart failure ([place]) 05/21/2015   Migraine 12/02/2016   Mild CAD 10/25/2014   Mixed  hyperlipidemia 11/10/2010   Last Assessment & Plan:  Formatting of this note might be different from the original. Continue statin therapy.   Nephrolithiasis 12/02/2016   Onychomycosis 11/10/2010   Osteoarthritis    PAD (peripheral artery disease) ([place]) 11/03/2014   Overview:  status post insertion of aortic stent and left common iliac stent by Dr. Bridgett Larsson in August of 2016   Peripheral arterial occlusive disease ([place]) 11/03/2014   Precordial chest pain 06/04/2016   Last Assessment & Plan:  My suspicion is that this is likely related to gastroesophageal reflux disease with acute indigestion last evening.  The biomarker elevation is not completely unexpected in this elderly patient with reported cardiomyopathy and current demand ventricular pacing.  Nonetheless she does have a history of extensive vascular disease (though apparently no significant coronary atherosclerosis in 2013) so we will proceed with a noninvasive risk stratification with pharmacological nuclear stress testing.  Assuming this is a low risk or normal study then I would not advise any invasive cardiac evaluation at this time.   Preoperative cardiovascular examination 04/02/2015   Rectal bleeding 11/19/2017   Formatting of this note might be different from the original. 2019   Renal artery stenosis ([place]) 12/02/2016   Rheumatoid arthritis involving multiple sites ([place]) 05/21/2015   Screening for breast cancer 04/02/2015   Stroke [facility])    Type 2 diabetes mellitus ([place]) 11/10/2010   Last Assessment & Plan:  Formatting of this note might be different from the original. Continue glipizide and add sliding scale insulin coverage Formatting of this note might be different from the original. Per 01/28/2019 Podiatry VN.   Vitamin D deficiency 06/20/2016    Objective:  Physical Exam: Vascular: DP/PT pulses 1/4 bilateral. CFT <3 seconds. Absent hair growth on digits. Edema noted to bilateral lower extremities. Xerosis noted bilaterally.  Skin. No lacerations  or abrasions bilateral feet. Nails 1-5 bilateral  are thickened discolored and elongated with subungual debris. Hyperkeratotic lesion medial IPJ right great and left fifth toe.  Musculoskeletal: MMT 5/5 bilateral lower extremities in DF, PF, Inversion and Eversion. Deceased ROM in DF of ankle joint.  Neurological: Sensation intact to light touch. Protective sensation diminished bilateral.    Assessment:   1. Pain due to onychomycosis of toenails of both feet   2. Corns and callosities   3. Type 2 diabetes, controlled, with neuropathy ([place])   4. PAD (peripheral artery disease) ([place])      Plan:  Patient was evaluated and treated and all questions answered. -Discussed and educated patient on diabetic foot care, especially with  regards to the vascular, neurological and musculoskeletal systems.  -Stressed the importance of good glycemic control and the detriment of not  controlling glucose levels in relation to the foot. -Discussed supportive shoes at all times and checking feet regularly.  -Mechanically debrided all nails 1-5 bilateral using sterile nail nipper and filed with dremel without incident  -Hyperkeratotic tissue debrided without incident.  -Answered all patient questions -Patient to return  in 3 months for at risk foot care -Patient  advised to call the office if any problems or questions arise in the meantime.   Lorenda Peck, DPM

## 2022-05-03 ENCOUNTER — Telehealth: Payer: Self-pay

## 2022-05-03 NOTE — Telephone Encounter (Signed)
   Pre-operative Risk Assessment    Patient Name: Shari [name]  DOB: March 15, 1935 MRN: [id number]      Request for Surgical Clearance    Procedure:   DMEK / CEOS  Date of Surgery:  Clearance 08/07/22                                 Surgeon:  DR Damita Dunnings Surgeon's Group or Practice Name:  [facility]  Phone number:   Fax number:  [phone]   Type of Clearance Requested:   - Pharmacy:  Hold Clopidogrel (Plavix) PLAVIX   Type of Anesthesia:  Not Indicated   Additional requests/questions:    [name]   05/03/2022, 8:20 AM

## 2022-05-03 NOTE — Telephone Encounter (Signed)
Primary Cardiologist:Brian Bettina Gavia, MD  Chart reviewed as part of pre-operative protocol coverage. Because of Shari [name]'s past medical history and time since last visit, he/she will require a follow-up visit in order to better assess preoperative cardiovascular risk.  Pre-op covering staff: - Please schedule appointment and call patient to inform them per note from Dr. Bettina Gavia, due for 6 month follow-up in April which will be prior to surgery - Please contact requesting surgeon's office via preferred method (i.e, phone, fax) to inform them of need for appointment prior to surgery.  If applicable, this message will also be routed to pharmacy pool and/or primary cardiologist for input on holding anticoagulant/antiplatelet agent as requested below so that this information is available at time of patient's appointment.   Emmaline Life, NP-C  05/03/2022, 8:43 AM [address], [address] Office [phone] Fax [phone]

## 2022-05-04 NOTE — Telephone Encounter (Signed)
Pt has appt 07/25/22 with Dr. Bettina Gavia for pre op clearance. I have added need pre op clearance to appt notes .

## 2022-05-04 NOTE — Telephone Encounter (Signed)
Pt is scheduled for April./kbl 05/04/2022

## 2022-05-11 DIAGNOSIS — H47093 Other disorders of optic nerve, not elsewhere classified, bilateral: Secondary | ICD-10-CM | POA: Insufficient documentation

## 2022-05-11 HISTORY — DX: Other disorders of optic nerve, not elsewhere classified, bilateral: H47.093

## 2022-05-11 NOTE — Telephone Encounter (Signed)
  [facility] called, she said pt was seen at their office yesterday and the doctor decided pt doesn't need the procedure as of this time. They are cancelling the preop clearance request

## 2022-05-11 NOTE — Telephone Encounter (Signed)
Noted below call, surgical clearance request cancelled.  Removed preop clearance notes on already scheduled, 6 month appointment.

## 2022-05-15 DIAGNOSIS — M316 Other giant cell arteritis: Secondary | ICD-10-CM

## 2022-05-15 HISTORY — DX: Other giant cell arteritis: M31.6

## 2022-06-08 ENCOUNTER — Other Ambulatory Visit: Payer: Self-pay | Admitting: Cardiology

## 2022-06-19 ENCOUNTER — Other Ambulatory Visit: Payer: Self-pay | Admitting: Cardiology

## 2022-06-19 NOTE — Telephone Encounter (Signed)
Rx refill sent to pharmacy. 

## 2022-06-21 ENCOUNTER — Ambulatory Visit (INDEPENDENT_AMBULATORY_CARE_PROVIDER_SITE_OTHER): Payer: Medicare PPO

## 2022-06-21 DIAGNOSIS — I428 Other cardiomyopathies: Secondary | ICD-10-CM | POA: Diagnosis not present

## 2022-06-22 LAB — CUP PACEART REMOTE DEVICE CHECK
Battery Remaining Longevity: 22 mo
Battery Remaining Percentage: 40 %
Battery Voltage: 2.9 V
Brady Statistic AP VP Percent: 1 %
Brady Statistic AP VS Percent: 1 %
Brady Statistic AS VP Percent: 99 %
Brady Statistic AS VS Percent: 1 %
Brady Statistic RA Percent Paced: 1 %
Date Time Interrogation Session: 20240320030029
HighPow Impedance: 55 Ohm
Implantable Lead Connection Status: 753985
Implantable Lead Connection Status: 753985
Implantable Lead Connection Status: 753985
Implantable Lead Implant Date: 20140702
Implantable Lead Implant Date: 20140702
Implantable Lead Implant Date: 20150730
Implantable Lead Location: 753858
Implantable Lead Location: 753859
Implantable Lead Location: 753860
Implantable Lead Model: 7122
Implantable Pulse Generator Implant Date: 20210322
Lead Channel Impedance Value: 300 Ohm
Lead Channel Impedance Value: 400 Ohm
Lead Channel Impedance Value: 430 Ohm
Lead Channel Pacing Threshold Amplitude: 1 V
Lead Channel Pacing Threshold Amplitude: 1 V
Lead Channel Pacing Threshold Amplitude: 2.5 V
Lead Channel Pacing Threshold Pulse Width: 0.4 ms
Lead Channel Pacing Threshold Pulse Width: 0.5 ms
Lead Channel Pacing Threshold Pulse Width: 1.2 ms
Lead Channel Sensing Intrinsic Amplitude: 1.5 mV
Lead Channel Sensing Intrinsic Amplitude: 9.7 mV
Lead Channel Setting Pacing Amplitude: 2 V
Lead Channel Setting Pacing Amplitude: 2.5 V
Lead Channel Setting Pacing Amplitude: 3.25 V
Lead Channel Setting Pacing Pulse Width: 0.4 ms
Lead Channel Setting Pacing Pulse Width: 1.2 ms
Lead Channel Setting Sensing Sensitivity: 0.5 mV
Pulse Gen Serial Number: 810001491
Zone Setting Status: 755011

## 2022-06-28 ENCOUNTER — Ambulatory Visit: Payer: Medicare PPO | Admitting: Podiatry

## 2022-06-28 ENCOUNTER — Encounter: Payer: Self-pay | Admitting: Podiatry

## 2022-06-28 DIAGNOSIS — E1140 Type 2 diabetes mellitus with diabetic neuropathy, unspecified: Secondary | ICD-10-CM

## 2022-06-28 DIAGNOSIS — B351 Tinea unguium: Secondary | ICD-10-CM

## 2022-06-28 DIAGNOSIS — L84 Corns and callosities: Secondary | ICD-10-CM | POA: Diagnosis not present

## 2022-06-28 DIAGNOSIS — I739 Peripheral vascular disease, unspecified: Secondary | ICD-10-CM

## 2022-06-28 DIAGNOSIS — M79674 Pain in right toe(s): Secondary | ICD-10-CM

## 2022-06-28 DIAGNOSIS — M79675 Pain in left toe(s): Secondary | ICD-10-CM | POA: Diagnosis not present

## 2022-06-28 NOTE — Progress Notes (Signed)
Subjective:  Patient ID: Shari [name], female    DOB: [birth year],   MRN: [id number]  No chief complaint on file.   88 y.o. female presents for concern of thickened elongated and painful nails that are difficult to trim. Requesting to have them trimmed today. Relates burning and tingling in their feet. Patient is diabetic and last A1c was 7.4 on 03/30/22.   PCP:  Algis Greenhouse, MD    . Denies any other pedal complaints. Denies n/v/f/c.   Past Medical History:  Diagnosis Date   Anxiety    Automatic implantable cardioverter-defibrillator in situ 10/25/2014   Overview:  Overview:  dual chamber ICD,upgraded to CRT with LBBB and nonischemic cardiomyopathy   Biventricular ICD (implantable cardioverter-defibrillator) in place 10/25/2014   Overview:  BIV -ICD,upgraded to CRT with LBBB and nonischemic cardiomyopathy   Chronic combined systolic and diastolic heart failure ([place]) 10/25/2014   Overview:  EF 77% by MUGA 06/15/15  Formatting of this note might be different from the original. Overview:  EF 66% by MUGA 03/12/14  Overview:  Overview:  EF 77% by MUGA 06/15/15  Overview:  EF 66% by MUGA 03/12/14  Overview:  Overview:  EF 77% by MUGA 06/15/15 Formatting of this note might be different from the original. EF 66% by MUGA 03/12/14   Chronic coronary artery disease 10/25/2014   Chronic kidney disease, stage 3 ([place]) 12/05/2016   Combined systolic and diastolic congestive heart failure ([place]) 10/25/2014   Last Assessment & Plan:  Formatting of this note might be different from the original. Type and degree unknown though patient currently appears to be euvolemic.   Coronary artery disease 10/25/2014   Costochondritis 10/25/2014   Diabetes mellitus 2 years   Diverticulitis    Dizziness 06/30/2018   Last Assessment & Plan:  Formatting of this note might be different from the original. 88 yo F with history of CHF, DM, PVD, h/o TIA with double vision in 2013, HTN, HLD.   Yesterday morning she reports that when  she woke up and looked over at her clock it looked blurry.  She denies double vision, facial droop or extremity weakness.  When she tried to get up to ambulate she reports that she felt d   Dysuria 06/15/2017   Elevated troponin 06/04/2016   Last Assessment & Plan:  Likely may be related to ventricular pacing and underlying cardiomyopathy.  The trajectory of the biomarker elevation and neither is her clinical history suggestive of acute coronary syndrome.  Further stratification with nuclear stress testing   Essential hypertension 06/04/2016   Last Assessment & Plan:  Formatting of this note might be different from the original. Continue carvedilol   Gastroesophageal reflux disease 11/10/2010   Last Assessment & Plan:  Formatting of this note might be different from the original. Continue Dexilant.   GERD (gastroesophageal reflux disease)    Glaucoma    Gout 12/02/2016   High cholesterol    History of ischemic stroke 06/30/2018   Hypertension    Hypertensive heart and kidney disease with heart failure and with chronic kidney disease stage III ([place]) 11/10/2010   Hypertensive heart disease with heart failure ([place]) 10/25/2014   Hypokalemia 06/04/2016   Last Assessment & Plan:  Replace with potassium chloride.   IBS (irritable bowel syndrome)    ICD (implantable cardioverter-defibrillator) in place 10/25/2014   Overview:  Overview:  dual chamber ICD,upgraded to CRT with LBBB and nonischemic cardiomyopathy  Formatting of this note might be different from the original.  Overview:  dual chamber ICD,upgraded to CRT with LBBB and nonischemic cardiomyopathy  Overview:  Overview:  BIV -ICD,upgraded to CRT with LBBB and nonischemic cardiomyopathy  Burna Forts, CMA - 09/07/2016 3:00 PM EDT REMOTE MONITORING ASSE   LBBB (left bundle branch block) 12/02/2016   Malignant hypertension with congestive heart failure ([place]) 05/21/2015   Migraine 12/02/2016   Mild CAD 10/25/2014   Mixed hyperlipidemia 11/10/2010   Last Assessment &  Plan:  Formatting of this note might be different from the original. Continue statin therapy.   Nephrolithiasis 12/02/2016   Onychomycosis 11/10/2010   Osteoarthritis    PAD (peripheral artery disease) ([place]) 11/03/2014   Overview:  status post insertion of aortic stent and left common iliac stent by Dr. Bridgett Larsson in August of 2016   Peripheral arterial occlusive disease ([place]) 11/03/2014   Precordial chest pain 06/04/2016   Last Assessment & Plan:  My suspicion is that this is likely related to gastroesophageal reflux disease with acute indigestion last evening.  The biomarker elevation is not completely unexpected in this elderly patient with reported cardiomyopathy and current demand ventricular pacing.  Nonetheless she does have a history of extensive vascular disease (though apparently no significant coronary atherosclerosis in 2013) so we will proceed with a noninvasive risk stratification with pharmacological nuclear stress testing.  Assuming this is a low risk or normal study then I would not advise any invasive cardiac evaluation at this time.   Preoperative cardiovascular examination 04/02/2015   Rectal bleeding 11/19/2017   Formatting of this note might be different from the original. 2019   Renal artery stenosis ([place]) 12/02/2016   Rheumatoid arthritis involving multiple sites ([place]) 05/21/2015   Screening for breast cancer 04/02/2015   Stroke [facility])    Type 2 diabetes mellitus ([place]) 11/10/2010   Last Assessment & Plan:  Formatting of this note might be different from the original. Continue glipizide and add sliding scale insulin coverage Formatting of this note might be different from the original. Per 01/28/2019 Podiatry VN.   Vitamin D deficiency 06/20/2016    Objective:  Physical Exam: Vascular: DP/PT pulses 1/4 bilateral. CFT <3 seconds. Absent hair growth on digits. Edema noted to bilateral lower extremities. Xerosis noted bilaterally.  Skin. No lacerations or abrasions bilateral feet. Nails 1-5  bilateral  are thickened discolored and elongated with subungual debris. Hyperkeratotic lesion medial IPJ right great and left fifth toe.  Musculoskeletal: MMT 5/5 bilateral lower extremities in DF, PF, Inversion and Eversion. Deceased ROM in DF of ankle joint.  Neurological: Sensation intact to light touch. Protective sensation diminished bilateral.    Assessment:   1. Pain due to onychomycosis of toenails of both feet   2. Corns and callosities   3. Type 2 diabetes, controlled, with neuropathy ([place])   4. PAD (peripheral artery disease) ([place])       Plan:  Patient was evaluated and treated and all questions answered. -Discussed and educated patient on diabetic foot care, especially with  regards to the vascular, neurological and musculoskeletal systems.  -Stressed the importance of good glycemic control and the detriment of not  controlling glucose levels in relation to the foot. -Discussed supportive shoes at all times and checking feet regularly.  -Mechanically debrided all nails 1-5 bilateral using sterile nail nipper and filed with dremel without incident  -Hyperkeratotic tissue debrided without incident.  -Answered all patient questions -Patient to return  in 3 months for at risk foot care -Patient advised to call the office if any problems or  questions arise in the meantime.   Lorenda Peck, DPM

## 2022-07-17 DIAGNOSIS — J189 Pneumonia, unspecified organism: Secondary | ICD-10-CM | POA: Insufficient documentation

## 2022-07-17 HISTORY — DX: Pneumonia, unspecified organism: J18.9

## 2022-07-24 NOTE — Progress Notes (Signed)
Cardiology Office Note:    Date:  07/25/2022   ID:  Shari [name], DOB Jul 16, 1934, MRN [id number]  PCP:  Olive Bass, MD  Cardiologist:  Norman Herrlich, MD    Referring MD: Olive Bass, MD    ASSESSMENT:    1. Hypertensive heart and kidney disease with heart failure and with chronic kidney disease stage III   2. Nonischemic cardiomyopathy   3. Mild CAD   4. Biventricular ICD (implantable cardioverter-defibrillator) in place   5. Dyslipidemia   6. Stage 3 chronic kidney disease, unspecified whether stage 3a or 3b CKD    PLAN:    In order of problems listed above:  Stable she has no edema on her current loop diuretic blood pressure at target continue her beta-blocker and ACE inhibitor Stable CAD good medical therapy including clopidogrel with with arterial disease beta-blocker oral and renal disease and statin Stable ICD function   Next appointment: 6 months   Medication Adjustments/Labs and Tests Ordered: Current medicines are reviewed at length with the patient today.  Concerns regarding medicines are outlined above.  Orders Placed This Encounter  Procedures   EKG 12-Lead   No orders of the defined types were placed in this encounter.   Chief complaint follow-up multiple cardiac problems CAD heart failure left bundle branch block CRT pacemaker   History of Present Illness:    Shari [name] is a 88 y.o. female with a hx of nonischemic cardiomyopathy due to left bundle branch block CRT/D therapy and normalization of ejection fraction mild nonobstructive CAD hypertensive heart disease with stage III CKD dyslipidemia peripheral arterial disease with previous PCI and stent left common carotid artery last seen 01/24/2022. Compliance with diet, lifestyle and medications: Yes  She is increasingly struggling caring for her husband at home and considering relocating to the independent living [facility] Had a prolonged respiratory infection with  antibiotics and has never quite recovered her strength and endurance and she steadily loses weight and has constipation and is seeing her PCP tomorrow No edema orthopnea chest pain palpitation or syncope Past Medical History:  Diagnosis Date   Anxiety    Automatic implantable cardioverter-defibrillator in situ 10/25/2014   Overview:  Overview:  dual chamber ICD,upgraded to CRT with LBBB and nonischemic cardiomyopathy   Biventricular ICD (implantable cardioverter-defibrillator) in place 10/25/2014   Overview:  BIV -ICD,upgraded to CRT with LBBB and nonischemic cardiomyopathy   Chronic combined systolic and diastolic heart failure 10/25/2014   Overview:  EF 77% by MUGA 06/15/15  Formatting of this note might be different from the original. Overview:  EF 66% by MUGA 03/12/14  Overview:  Overview:  EF 77% by MUGA 06/15/15  Overview:  EF 66% by MUGA 03/12/14  Overview:  Overview:  EF 77% by MUGA 06/15/15 Formatting of this note might be different from the original. EF 66% by MUGA 03/12/14   Chronic coronary artery disease 10/25/2014   Chronic kidney disease, stage 3 12/05/2016   Combined systolic and diastolic congestive heart failure 10/25/2014   Last Assessment & Plan:  Formatting of this note might be different from the original. Type and degree unknown though patient currently appears to be euvolemic.   Coronary artery disease 10/25/2014   Costochondritis 10/25/2014   Diabetes mellitus 2 years   Diverticulitis    Dizziness 06/30/2018   Last Assessment & Plan:  Formatting of this note might be different from the original. 88 yo F with history of CHF, DM, PVD, h/o TIA with  double vision in 2013, HTN, HLD.   Yesterday morning she reports that when she woke up and looked over at her clock it looked blurry.  She denies double vision, facial droop or extremity weakness.  When she tried to get up to ambulate she reports that she felt d   Dysuria 06/15/2017   Elevated troponin 06/04/2016   Last Assessment & Plan:   Likely may be related to ventricular pacing and underlying cardiomyopathy.  The trajectory of the biomarker elevation and neither is her clinical history suggestive of acute coronary syndrome.  Further stratification with nuclear stress testing   Essential hypertension 06/04/2016   Last Assessment & Plan:  Formatting of this note might be different from the original. Continue carvedilol   Gastroesophageal reflux disease 11/10/2010   Last Assessment & Plan:  Formatting of this note might be different from the original. Continue Dexilant.   GERD (gastroesophageal reflux disease)    Glaucoma    Gout 12/02/2016   High cholesterol    History of ischemic stroke 06/30/2018   Hypertension    Hypertensive heart and kidney disease with heart failure and with chronic kidney disease stage III 11/10/2010   Hypertensive heart disease with heart failure 10/25/2014   Hypokalemia 06/04/2016   Last Assessment & Plan:  Replace with potassium chloride.   IBS (irritable bowel syndrome)    ICD (implantable cardioverter-defibrillator) in place 10/25/2014   Overview:  Overview:  dual chamber ICD,upgraded to CRT with LBBB and nonischemic cardiomyopathy  Formatting of this note might be different from the original. Overview:  dual chamber ICD,upgraded to CRT with LBBB and nonischemic cardiomyopathy  Overview:  Overview:  BIV -ICD,upgraded to CRT with LBBB and nonischemic cardiomyopathy  Arturo Morton, CMA - 09/07/2016 3:00 PM EDT REMOTE MONITORING ASSE   LBBB (left bundle branch block) 12/02/2016   Malignant hypertension with congestive heart failure 05/21/2015   Migraine 12/02/2016   Mild CAD 10/25/2014   Mixed hyperlipidemia 11/10/2010   Last Assessment & Plan:  Formatting of this note might be different from the original. Continue statin therapy.   Nephrolithiasis 12/02/2016   Onychomycosis 11/10/2010   Osteoarthritis    PAD (peripheral artery disease) 11/03/2014   Overview:  status post insertion of aortic stent and left common iliac  stent by Dr. Imogene Burn in August of 2016   Peripheral arterial occlusive disease 11/03/2014   Precordial chest pain 06/04/2016   Last Assessment & Plan:  My suspicion is that this is likely related to gastroesophageal reflux disease with acute indigestion last evening.  The biomarker elevation is not completely unexpected in this elderly patient with reported cardiomyopathy and current demand ventricular pacing.  Nonetheless she does have a history of extensive vascular disease (though apparently no significant coronary atherosclerosis in 2013) so we will proceed with a noninvasive risk stratification with pharmacological nuclear stress testing.  Assuming this is a low risk or normal study then I would not advise any invasive cardiac evaluation at this time.   Preoperative cardiovascular examination 04/02/2015   Rectal bleeding 11/19/2017   Formatting of this note might be different from the original. 2019   Renal artery stenosis 12/02/2016   Rheumatoid arthritis involving multiple sites 05/21/2015   Screening for breast cancer 04/02/2015   Stroke    Type 2 diabetes mellitus 11/10/2010   Last Assessment & Plan:  Formatting of this note might be different from the original. Continue glipizide and add sliding scale insulin coverage Formatting of this note might be different from the  original. Per 01/28/2019 Podiatry VN.   Vitamin D deficiency 06/20/2016    Past Surgical History:  Procedure Laterality Date   BIV ICD GENERATOR CHANGEOUT N/A 06/23/2019   Procedure: BIV ICD GENERATOR CHANGEOUT;  Surgeon: Regan Lemming, MD;  [facility] IN[facility]ASIVE CV LAB;  Service: Cardiovascular;  Laterality: N/A;   FOOT SURGERY     pacemaker surgery     a-fib and was done july 2014   PERIPHERAL VASCULAR CATHETERIZATION N/A 11/05/2014   Procedure: Abdominal Aortogram;  Surgeon: Fransisco Hertz, MD;  Location: [facility]VAS[facility] CV LAB;  Service: Cardiovascular;  Laterality: N/A;    Current Medications: Current Meds  Medication Sig    acetaminophen (TYLENOL) 500 MG tablet Take 1,000 mg by mouth 2 (two) times daily.   atorvastatin (LIPITOR) 40 MG tablet Take 1 tablet (40 mg total) by mouth 3 (three) times a week. Take 40 mg on Monday, Wednesday and friday (Patient taking differently: Take 40 mg by mouth daily.)   Calcium Carb-Cholecalciferol (CALCIUM HIGH POTENCY/VITAMIN D) 600-5 MG-MCG TABS Take 1 tablet by mouth daily.   clopidogrel (PLAVIX) 75 MG tablet Take 1 tablet (75 mg total) by mouth daily.   esomeprazole (NEXIUM) 40 MG packet Take 40 mg by mouth daily before breakfast.   furosemide (LASIX) 80 MG tablet TAKE ONE TABLET BY MOUTH DAILY   glipiZIDE (GLUCOTROL) 10 MG tablet Take 10 mg by mouth 2 (two) times daily before a meal.    isosorbide mononitrate (IMDUR) 60 MG 24 hr tablet Take 1 tablet (60 mg total) by mouth daily.   JANUVIA 25 MG tablet Take 25 mg by mouth daily.   ketoconazole (NIZORAL) 2 % shampoo Apply 1 application topically 2 (two) times a week.   metoprolol succinate (TOPROL-XL) 25 MG 24 hr tablet Take 1 tablet (25 mg total) by mouth daily.   montelukast (SINGULAIR) 10 MG tablet Take by mouth.   Multiple Vitamins-Minerals (THERA-M) TABS Take 1 tablet by mouth daily.   Multiple Vitamins-Minerals (ZINC PO) Take 1 tablet by mouth daily at 12 noon.   NITROSTAT 0.4 MG SL tablet DISSOLVE 1 TABLET UNDER THE TONGUE AS NEEDED FOR CHEST PAIN.   potassium chloride (KLOR-CON) 10 MEQ tablet Take 1 tablet (10 mEq total) by mouth daily.   Prodigy Twist Top Lancets 28G MISC by Does not apply route.   ramipril (ALTACE) 10 MG capsule Take 10 mg by mouth daily.   ranitidine (ZANTAC) 300 MG tablet Take by mouth.   ranolazine (RANEXA) 500 MG 12 hr tablet Take by mouth.   RESTASIS 0.05 % ophthalmic emulsion Place 1 drop into both eyes 2 (two) times daily.   sodium chloride (MURO 128) 5 % ophthalmic solution 1 drop 2 (two) times daily.   terbinafine (LAMISIL) 250 MG tablet Take 250 mg by mouth daily as needed (Dermatitis on  scalp).    tolterodine (DETROL LA) 4 MG 24 hr capsule Take 4 mg by mouth daily.   traZODone (DESYREL) 50 MG tablet Take by mouth.   triamcinolone cream (KENALOG) 0.5 % apply TWICE DAILY to itchy spots on scalp FOR 1-2 WEEKS AS NEEDED   VASCEPA 1 g CAPS Take 2 g by mouth 2 (two) times daily.    Vitamin D, Ergocalciferol, (DRISDOL) 50000 units CAPS capsule Take 50,000 Units by mouth every 14 (fourteen) days.      Allergies:   Doxycycline, Metformin, Codeine, Dulaglutide, Ivp dye [iodinated contrast media], Metrizamide, and Pioglitazone   Social History   Socioeconomic History   Marital  status: Married    Spouse name: Not on file   Number of children: Not on file   Years of education: Not on file   Highest education level: Not on file  Occupational History   Not on file  Tobacco Use   Smoking status: Never    Passive exposure: Never   Smokeless tobacco: Never  Vaping Use   Vaping Use: Never used  Substance and Sexual Activity   Alcohol use: No    Alcohol/week: 0.0 standard drinks of alcohol   Drug use: No   Sexual activity: Not on file  Other Topics Concern   Not on file  Social History Narrative   Not on file   Social Determinants of Health   Financial Resource Strain: Not on file  Food Insecurity: Not on file  Transportation Needs: Not on file  Physical Activity: Not on file  Stress: Not on file  Social Connections: Not on file     Family History: The patient's family history includes Alzheimer's disease in her mother; Arthritis in her mother and sister; Bladder Cancer in her father; Heart attack in her maternal grandfather and maternal grandmother; Hyperlipidemia in her father and mother; Vitiligo in her sister. ROS:   Please see the history of present illness.    All other systems reviewed and are negative.  EKGs/Labs/Other Studies Reviewed:    The following studies were reviewed today:  Cardiac Studies & Procedures       ECHOCARDIOGRAM  ECHOCARDIOGRAM  COMPLETE 06/20/2021  Narrative ECHOCARDIOGRAM REPORT    Patient Name:   ZILA LEAS Date of Exam: 06/20/2021 Medical Rec #:  [id number]     Height:       62.0 in Accession #:    [id number]    Weight:       120.2 lb Date of Birth:  [birth year]     BSA:          1.540 m Patient Age:    88 years      BP:           122/70 mmHg Patient Gender: F             HR:           69 bpm. Exam Location:  [facility]  Procedure: 2D Echo, Cardiac Doppler, Color Doppler and Strain Analysis  Indications:    Congestive Heart Failure I50.9, Nonischemic cardiomyopathy (HCC) [I42.8]  History:        Patient has prior history of Echocardiogram examinations, most recent 05/22/2017. CAD, Biventricular ICD, Hypertensive heart and kidney disease with heart failure and with chronic kidney disease stage III and PAD, Arrythmias:LBBB; Risk Factors:Hypertension and Diabetes.  Sonographer:    Louie Boston RDCS Referring Phys: [id number] Shakenna Herrero J Dalyn Becker  IMPRESSIONS   1. Left ventricular ejection fraction, by estimation, is 55 to 60%. The left ventricle has normal function. The left ventricle has no regional wall motion abnormalities. There is moderate concentric left ventricular hypertrophy. Left ventricular diastolic parameters are consistent with Grade I diastolic dysfunction (impaired relaxation). The average left ventricular global longitudinal strain is -9.1 %. The global longitudinal strain is abnormal. 2. Right ventricular systolic function is normal. The right ventricular size is normal. There is normal pulmonary artery systolic pressure. 3. The pericardial effusion is posterior to the left ventricle. 4. The mitral valve is normal in structure. Mild mitral valve regurgitation. No evidence of mitral stenosis. 5. The aortic valve is normal in structure. Aortic valve regurgitation is trivial. Aortic valve  sclerosis is present, with no evidence of aortic valve stenosis. 6. The inferior vena cava is normal in size with  greater than 50% respiratory variability, suggesting right atrial pressure of 3 mmHg.  FINDINGS Left Ventricle: Left ventricular ejection fraction, by estimation, is 55 to 60%. The left ventricle has normal function. The left ventricle has no regional wall motion abnormalities. The average left ventricular global longitudinal strain is -9.1 %. The global longitudinal strain is abnormal. The left ventricular internal cavity size was normal in size. There is moderate concentric left ventricular hypertrophy. Left ventricular diastolic parameters are consistent with Grade I diastolic dysfunction (impaired relaxation). Indeterminate filling pressures.  Right Ventricle: The right ventricular size is normal. No increase in right ventricular wall thickness. Right ventricular systolic function is normal. There is normal pulmonary artery systolic pressure. The tricuspid regurgitant velocity is 2.44 m/s, and with an assumed right atrial pressure of 3 mmHg, the estimated right ventricular systolic pressure is 26.8 mmHg.  Left Atrium: Left atrial size was normal in size.  Right Atrium: Right atrial size was normal in size.  Pericardium: Trivial pericardial effusion is present. The pericardial effusion is posterior to the left ventricle.  Mitral Valve: The mitral valve is normal in structure. Mild mitral valve regurgitation. No evidence of mitral valve stenosis.  Tricuspid Valve: The tricuspid valve is normal in structure. Tricuspid valve regurgitation is mild . No evidence of tricuspid stenosis.  Aortic Valve: The aortic valve is normal in structure. Aortic valve regurgitation is trivial. Aortic valve sclerosis is present, with no evidence of aortic valve stenosis.  Pulmonic Valve: The pulmonic valve was normal in structure. Pulmonic valve regurgitation is not visualized. No evidence of pulmonic stenosis.  Aorta: The aortic arch was not well visualized and the aortic root and ascending aorta are  structurally normal, with no evidence of dilitation.  Venous: A normal flow pattern is recorded from the right upper pulmonary vein. The inferior vena cava is normal in size with greater than 50% respiratory variability, suggesting right atrial pressure of 3 mmHg.  IAS/Shunts: No atrial level shunt detected by color flow Doppler.  Additional Comments: A device lead is visualized in the right atrium.   LEFT VENTRICLE PLAX 2D LVIDd:         2.90 cm   Diastology LVIDs:         2.00 cm   LV e' medial:    3.44 cm/s LV PW:         1.42 cm   LV E/e' medial:  25.0 LV IVS:        1.42 cm   LV e' lateral:   3.23 cm/s LVOT diam:     1.90 cm   LV E/e' lateral: 26.6 LV SV:         59 LV SV Index:   38        2D Longitudinal Strain LVOT Area:     2.84 cm  2D Strain GLS Avg:     -9.1 %   RIGHT VENTRICLE             IVC RV S prime:     11.10 cm/s  IVC diam: 1.10 cm TAPSE (M-mode): 1.9 cm  LEFT ATRIUM             Index        RIGHT ATRIUM          Index LA diam:        3.00 cm 1.95 cm/m   RA  Area:     7.98 cm LA Vol (A2C):   51.0 ml 33.12 ml/m  RA Volume:   16.10 ml 10.46 ml/m LA Vol (A4C):   39.8 ml 25.85 ml/m LA Biplane Vol: 47.7 ml 30.98 ml/m AORTIC VALVE LVOT Vmax:   87.50 cm/s LVOT Vmean:  63.900 cm/s LVOT VTI:    0.208 m  AORTA Ao Root diam: 2.70 cm Ao Asc diam:  3.50 cm Ao Desc diam: 1.60 cm  MITRAL VALVE                TRICUSPID VALVE MV Area (PHT): 2.40 cm     TR Peak grad:   23.8 mmHg MV Decel Time: 316 msec     TR Vmax:        244.00 cm/s MV E velocity: 85.90 cm/s MV A velocity: 121.00 cm/s  SHUNTS MV E/A ratio:  0.71         Systemic VTI:  0.21 m Systemic Diam: 1.90 cm  Norman Herrlich MD Electronically signed by Norman Herrlich MD Signature Date/Time: 06/20/2021/4:05:21 PM    Final    MONITORS  CARDIAC EVENT MONITOR 11/05/2017           EKG:  EKG ordered today and personally reviewed.  The ekg ordered today demonstrates atrial sensed ventricular paced  rhythm  Recent Labs: 05/15/2022 creatinine 1.1H sodium 140 potassium 3.6 hemoglobin 13.0 03/30/2022 cholesterol 173 non-HDL, strong 136 LDL 64 triglycerides 316  Physical Exam:    VS:  BP (!) 126/58 (BP Location: Right Arm, Patient Position: Sitting)   Pulse 70   Ht 5\' 2"  (1.575 m)   Wt 117 lb 9.6 oz (53.3 kg)   SpO2 97%   BMI 21.51 kg/m     Wt Readings from Last 3 Encounters:  07/25/22 117 lb 9.6 oz (53.3 kg)  01/24/22 115 lb 12.8 oz (52.5 kg)  06/13/21 120 lb 3.2 oz (54.5 kg)     GEN:  Well nourished, well developed in no acute distress HEENT: Normal NECK: No JVD; No carotid bruits LYMPHATICS: No lymphadenopathy CARDIAC: RRR, no murmurs, rubs, gallops RESPIRATORY:  Clear to auscultation without rales, wheezing or rhonchi  ABDOMEN: Soft, non-tender, non-distended MUSCULOSKELETAL:  No edema; No deformity  SKIN: Warm and dry NEUROLOGIC:  Alert and oriented x 3 PSYCHIATRIC:  Normal affect    Signed, Norman Herrlich, MD  07/25/2022 2:42 PM    [facility]

## 2022-07-25 ENCOUNTER — Encounter: Payer: Self-pay | Admitting: Cardiology

## 2022-07-25 ENCOUNTER — Ambulatory Visit: Payer: Medicare PPO | Attending: Cardiology | Admitting: Cardiology

## 2022-07-25 VITALS — BP 126/58 | HR 70 | Ht 62.0 in | Wt 117.6 lb

## 2022-07-25 DIAGNOSIS — I428 Other cardiomyopathies: Secondary | ICD-10-CM

## 2022-07-25 DIAGNOSIS — I130 Hypertensive heart and chronic kidney disease with heart failure and stage 1 through stage 4 chronic kidney disease, or unspecified chronic kidney disease: Secondary | ICD-10-CM | POA: Diagnosis not present

## 2022-07-25 DIAGNOSIS — E785 Hyperlipidemia, unspecified: Secondary | ICD-10-CM

## 2022-07-25 DIAGNOSIS — Z95810 Presence of automatic (implantable) cardiac defibrillator: Secondary | ICD-10-CM

## 2022-07-25 DIAGNOSIS — I2510 Atherosclerotic heart disease of native coronary artery without angina pectoris: Secondary | ICD-10-CM

## 2022-07-25 DIAGNOSIS — N183 Chronic kidney disease, stage 3 unspecified: Secondary | ICD-10-CM

## 2022-07-25 NOTE — Patient Instructions (Signed)
Medication Instructions:  Your physician recommends that you continue on your current medications as directed. Please refer to the Current Medication list given to you today.  *If you need a refill on your cardiac medications before your next appointment, please call your pharmacy*   Lab Work: None If you have labs (blood work) drawn today and your tests are completely normal, you will receive your results only by: MyChart Message (if you have MyChart) OR A paper copy in the mail If you have any lab test that is abnormal or we need to change your treatment, we will call you to review the results.   Testing/Procedures: None   Follow-Up: At [facility], you and your health needs are our priority.  As part of our continuing mission to provide you with exceptional heart care, we have created designated Provider Care Teams.  These Care Teams include your primary Cardiologist (physician) and Advanced Practice Providers (APPs -  Physician Assistants and Nurse Practitioners) who all work together to provide you with the care you need, when you need it.  We recommend signing up for the patient portal called "MyChart".  Sign up information is provided on this After Visit Summary.  MyChart is used to connect with patients for Virtual Visits (Telemedicine).  Patients are able to view lab/test results, encounter notes, upcoming appointments, etc.  Non-urgent messages can be sent to your provider as well.   To learn more about what you can do with MyChart, go to [email or web address].    Your next appointment:   6 month(s)  Provider:   Brian Munley, MD    Other Instructions None  

## 2022-07-26 DIAGNOSIS — R634 Abnormal weight loss: Secondary | ICD-10-CM | POA: Insufficient documentation

## 2022-07-26 DIAGNOSIS — R221 Localized swelling, mass and lump, neck: Secondary | ICD-10-CM

## 2022-07-26 HISTORY — DX: Localized swelling, mass and lump, neck: R22.1

## 2022-07-26 HISTORY — DX: Abnormal weight loss: R63.4

## 2022-07-27 NOTE — Progress Notes (Signed)
Remote ICD transmission.   

## 2022-08-01 DIAGNOSIS — H469 Unspecified optic neuritis: Secondary | ICD-10-CM

## 2022-08-01 HISTORY — DX: Unspecified optic neuritis: H46.9

## 2022-08-04 ENCOUNTER — Telehealth: Payer: Self-pay | Admitting: Cardiology

## 2022-08-04 ENCOUNTER — Other Ambulatory Visit: Payer: Self-pay | Admitting: Cardiology

## 2022-08-04 NOTE — Telephone Encounter (Signed)
  Pt c/o medication issue:  1. Name of Medication: atorvastatin (LIPITOR) 40 MG tablet   2. How are you currently taking this medication (dosage and times per day)?   Take 1 tablet (40 mg total) by mouth 3 (three) times a week. Take 40 mg on Monday, Wednesday and fridayPatient taking differently: Take 40 mg by mouth daily.    3. Are you having a reaction (difficulty breathing--STAT)? No   4. What is your medication issue? [facility] calling, they would like to confirm the dosage for this medication

## 2022-08-04 NOTE — Telephone Encounter (Signed)
Rx to pharmacy

## 2022-09-20 ENCOUNTER — Ambulatory Visit (INDEPENDENT_AMBULATORY_CARE_PROVIDER_SITE_OTHER): Payer: Medicare PPO

## 2022-09-20 DIAGNOSIS — I428 Other cardiomyopathies: Secondary | ICD-10-CM | POA: Diagnosis not present

## 2022-09-20 DIAGNOSIS — I5022 Chronic systolic (congestive) heart failure: Secondary | ICD-10-CM

## 2022-09-20 LAB — CUP PACEART REMOTE DEVICE CHECK
Battery Remaining Longevity: 19 mo
Battery Remaining Percentage: 35 %
Battery Voltage: 2.9 V
Brady Statistic AP VP Percent: 1 %
Brady Statistic AP VS Percent: 1 %
Brady Statistic AS VP Percent: 99 %
Brady Statistic AS VS Percent: 1 %
Brady Statistic RA Percent Paced: 1 %
Date Time Interrogation Session: 20240619020041
HighPow Impedance: 53 Ohm
Implantable Lead Connection Status: 753985
Implantable Lead Connection Status: 753985
Implantable Lead Connection Status: 753985
Implantable Lead Implant Date: 20140702
Implantable Lead Implant Date: 20140702
Implantable Lead Implant Date: 20150730
Implantable Lead Location: 753858
Implantable Lead Location: 753859
Implantable Lead Location: 753860
Implantable Lead Model: 7122
Implantable Pulse Generator Implant Date: 20210322
Lead Channel Impedance Value: 330 Ohm
Lead Channel Impedance Value: 430 Ohm
Lead Channel Impedance Value: 450 Ohm
Lead Channel Pacing Threshold Amplitude: 1 V
Lead Channel Pacing Threshold Amplitude: 1 V
Lead Channel Pacing Threshold Amplitude: 2.5 V
Lead Channel Pacing Threshold Pulse Width: 0.4 ms
Lead Channel Pacing Threshold Pulse Width: 0.5 ms
Lead Channel Pacing Threshold Pulse Width: 1.2 ms
Lead Channel Sensing Intrinsic Amplitude: 1.4 mV
Lead Channel Sensing Intrinsic Amplitude: 12 mV
Lead Channel Setting Pacing Amplitude: 2 V
Lead Channel Setting Pacing Amplitude: 2.5 V
Lead Channel Setting Pacing Amplitude: 3.25 V
Lead Channel Setting Pacing Pulse Width: 0.4 ms
Lead Channel Setting Pacing Pulse Width: 1.2 ms
Lead Channel Setting Sensing Sensitivity: 0.5 mV
Pulse Gen Serial Number: 810001491
Zone Setting Status: 755011

## 2022-09-24 DIAGNOSIS — H401133 Primary open-angle glaucoma, bilateral, severe stage: Secondary | ICD-10-CM | POA: Insufficient documentation

## 2022-09-24 HISTORY — DX: Primary open-angle glaucoma, bilateral, severe stage: H40.1133

## 2022-09-28 ENCOUNTER — Ambulatory Visit: Payer: Medicare PPO | Admitting: Podiatry

## 2022-09-29 ENCOUNTER — Ambulatory Visit: Payer: Medicare PPO | Admitting: Podiatry

## 2022-10-02 ENCOUNTER — Encounter: Payer: Self-pay | Admitting: Cardiology

## 2022-10-02 ENCOUNTER — Ambulatory Visit: Payer: Medicare PPO | Attending: Cardiology | Admitting: Cardiology

## 2022-10-02 VITALS — BP 120/80 | HR 70 | Ht 62.0 in | Wt 115.8 lb

## 2022-10-02 DIAGNOSIS — I5042 Chronic combined systolic (congestive) and diastolic (congestive) heart failure: Secondary | ICD-10-CM

## 2022-10-02 DIAGNOSIS — I2510 Atherosclerotic heart disease of native coronary artery without angina pectoris: Secondary | ICD-10-CM | POA: Diagnosis not present

## 2022-10-02 DIAGNOSIS — I10 Essential (primary) hypertension: Secondary | ICD-10-CM | POA: Diagnosis not present

## 2022-10-02 LAB — CUP PACEART INCLINIC DEVICE CHECK
Battery Remaining Longevity: 19 mo
Brady Statistic RA Percent Paced: 0.15 %
Brady Statistic RV Percent Paced: 99.95 %
Date Time Interrogation Session: 20240701121750
HighPow Impedance: 58.5 Ohm
Implantable Lead Connection Status: 753985
Implantable Lead Connection Status: 753985
Implantable Lead Connection Status: 753985
Implantable Lead Implant Date: 20140702
Implantable Lead Implant Date: 20140702
Implantable Lead Implant Date: 20150730
Implantable Lead Location: 753858
Implantable Lead Location: 753859
Implantable Lead Location: 753860
Implantable Lead Model: 7122
Implantable Pulse Generator Implant Date: 20210322
Lead Channel Impedance Value: 312.5 Ohm
Lead Channel Impedance Value: 412.5 Ohm
Lead Channel Impedance Value: 462.5 Ohm
Lead Channel Pacing Threshold Amplitude: 0.75 V
Lead Channel Pacing Threshold Amplitude: 0.75 V
Lead Channel Pacing Threshold Amplitude: 1 V
Lead Channel Pacing Threshold Amplitude: 1 V
Lead Channel Pacing Threshold Amplitude: 2.5 V
Lead Channel Pacing Threshold Amplitude: 2.5 V
Lead Channel Pacing Threshold Pulse Width: 0.4 ms
Lead Channel Pacing Threshold Pulse Width: 0.4 ms
Lead Channel Pacing Threshold Pulse Width: 0.5 ms
Lead Channel Pacing Threshold Pulse Width: 0.5 ms
Lead Channel Pacing Threshold Pulse Width: 1.2 ms
Lead Channel Pacing Threshold Pulse Width: 1.2 ms
Lead Channel Sensing Intrinsic Amplitude: 10.6 mV
Lead Channel Sensing Intrinsic Amplitude: 2.2 mV
Lead Channel Setting Pacing Amplitude: 2 V
Lead Channel Setting Pacing Amplitude: 2.5 V
Lead Channel Setting Pacing Amplitude: 3.25 V
Lead Channel Setting Pacing Pulse Width: 0.4 ms
Lead Channel Setting Pacing Pulse Width: 1.2 ms
Lead Channel Setting Sensing Sensitivity: 0.5 mV
Pulse Gen Serial Number: 810001491
Zone Setting Status: 755011

## 2022-10-02 NOTE — Progress Notes (Signed)
  Electrophysiology Office Note:   Date:  10/02/2022  ID:  Shari [name], DOB Apr 23, 1934, MRN [id number]  Primary Cardiologist: Norman Herrlich, MD Electrophysiologist: Regan Lemming, MD      History of Present Illness:   Shari [name] is a 88 y.o. female with h/o CHF seen today for routine electrophysiology followup.  Since last being seen in our clinic the patient reports doing from a cardiac perspective.  She is having no chest pain or shortness of breath.  She is fatigued, but is generally able to do all her daily activities.  She is having issues with eye trouble and her husband's chronic illnesses.  She does not have any cardiac complaints at this time.  she denies chest pain, palpitations, dyspnea, PND, orthopnea, nausea, vomiting, dizziness, syncope, edema, weight gain, or early satiety.     She has a history of hypertension, coronary artery disease, chronic systolic heart failure, hyperlipidemia.  She is post Abbott CRT-D with generator change 06/23/2019.  Her ejection fraction has normalized on her most recent echo.    Review of systems complete and found to be negative unless listed in HPI.      EP information / Studies Reviewed:    EKG is not ordered today. EKG from 07/25/22 reviewed which showed A sense, V pace      ICD Interrogation-  reviewed in detail today,  See PACEART report.  Device History: Abbott BiV ICD Gen change 06/23/19 for CHF History of appropriate therapy: No History of AAD therapy: No   Risk Assessment/Calculations:              Physical Exam:   VS:  BP 120/80   Pulse 70   Ht 5\' 2"  (1.575 m)   Wt 115 lb 12.8 oz (52.5 kg)   SpO2 97%   BMI 21.18 kg/m    Wt Readings from Last 3 Encounters:  10/02/22 115 lb 12.8 oz (52.5 kg)  07/25/22 117 lb 9.6 oz (53.3 kg)  01/24/22 115 lb 12.8 oz (52.5 kg)     GEN: Well nourished, well developed in no acute distress NECK: No JVD; No carotid bruits CARDIAC: Regular rate and rhythm, no murmurs, rubs,  gallops RESPIRATORY:  Clear to auscultation without rales, wheezing or rhonchi  ABDOMEN: Soft, non-tender, non-distended EXTREMITIES:  No edema; No deformity   ASSESSMENT AND PLAN:    1.  Chronic systolic dysfunction s/p Abbott CRT-D  euvolemic today Stable on an appropriate medical regimen Normal ICD function See Pace Art report No changes today  2.  Hypertension: Currently well-controlled  3.  Hyperlipidemia: Continue atorvastatin per primary cardiology  4.  Coronary artery disease: Currently on Plavix.  No current chest pain  Disposition:   Follow up with Dr. Elberta Fortis in 12 months   Signed, Daschel Roughton [name], MD

## 2022-10-02 NOTE — Patient Instructions (Signed)
Medication Instructions:  Your physician recommends that you continue on your current medications as directed. Please refer to the Current Medication list given to you today.  *If you need a refill on your cardiac medications before your next appointment, please call your pharmacy*   Lab Work: None ordered   Testing/Procedures: None ordered   Follow-Up: At [facility], you and your health needs are our priority.  As part of our continuing mission to provide you with exceptional heart care, we have created designated Provider Care Teams.  These Care Teams include your primary Cardiologist (physician) and Advanced Practice Providers (APPs -  Physician Assistants and Nurse Practitioners) who all work together to provide you with the care you need, when you need it.  We recommend signing up for the patient portal called "MyChart".  Sign up information is provided on this After Visit Summary.  MyChart is used to connect with patients for Virtual Visits (Telemedicine).  Patients are able to view lab/test results, encounter notes, upcoming appointments, etc.  Non-urgent messages can be sent to your provider as well.   To learn more about what you can do with MyChart, go to [email or web address].    Remote monitoring is used to monitor your Pacemaker or ICD from home. This monitoring reduces the number of office visits required to check your device to one time per year. It allows [place] to keep an eye on the functioning of your device to ensure it is working properly. You are scheduled for a device check from home on 12/20/2022. You may send your transmission at any time that day. If you have a wireless device, the transmission will be sent automatically. After your physician reviews your transmission, you will receive a postcard with your next transmission date.  Your next appointment:   1 year(s)  The format for your next appointment:   In Person  Provider:   Loman Brooklyn, MD    Thank you  for choosing [facility]!!   Dory Horn, RN [phone]

## 2022-10-31 IMAGING — MR MR MRA ABDOMEN W/ OR W/O CM
8 of 11 series · 36 of 48 positions shown · IV contrast (Contrast agent)
Comparison: Report from aortic and iliac arterial duplex ultrasound
dated 06/22/2020

CLINICAL DATA: History of prior stenting infrarenal abdominal aorta
and left common iliac artery with covered stents on 11/05/2014.
Limited visualization previously stented segments by duplex
ultrasound with recent resting ABI calculations of 0.94 on the right
and 0.88 on the left.

EXAM:
MRA ABDOMEN WITH CONTRAST
TECHNIQUE: Multiplanar, multiecho pulse sequences of the abdomen and pelvis
were obtained with intravenous contrast. Angiographic images of
abdomen and pelvis were obtained using MRA technique with
intravenous contrast.
CONTRAST:  6mL GADAVIST GADOBUTROL 1 MMOL/ML IV SOLN

[Series 2: ax haste · axial · 6.0mm · 1.06mm/px · 1 of 30 slices shown]
[im 1/30]
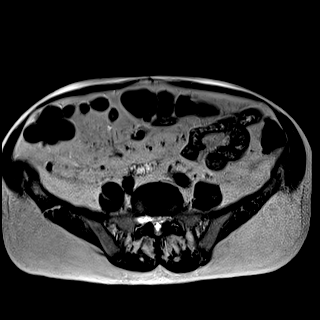

[Series 3: cor haste · coronal · 6.0mm · 1.06mm/px · 1 of 30 slices shown]
[im 1/30]
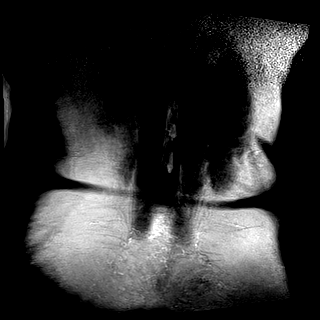

[Series 6: T2 fat-sat · axial · 6.0mm · 1.19mm/px · 1 of 30 slices shown]
[im 1/30]
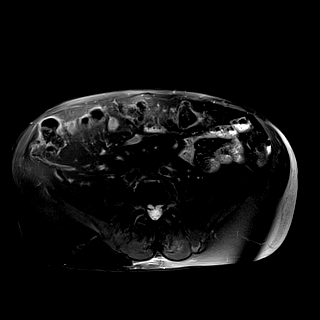

[Series 7: angio_fl3d_cor_pre · coronal · 1.1mm · 0.91mm/px · 7 of 104 slices shown]
[im 1/104]
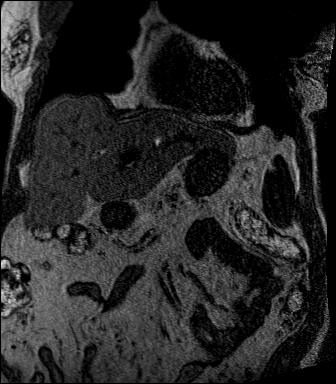
[im 18/104]
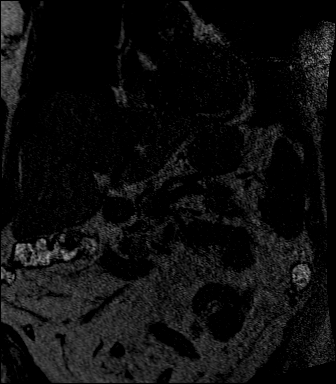
[im 35/104]
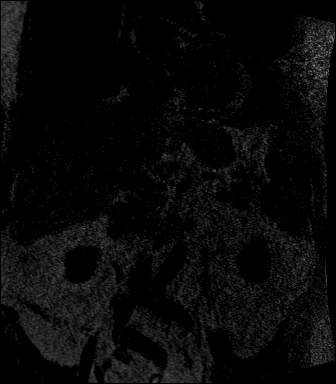
[im 52/104]
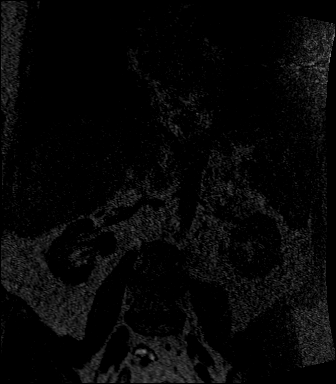
[im 69/104]
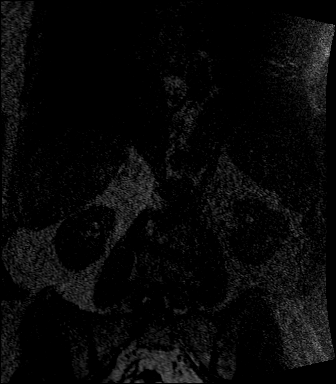
[im 86/104]
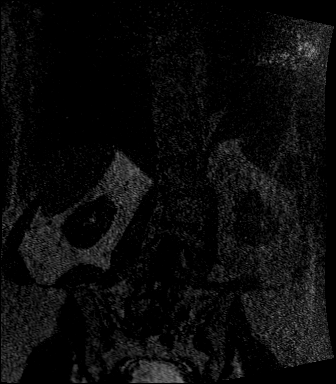
[im 104/104]
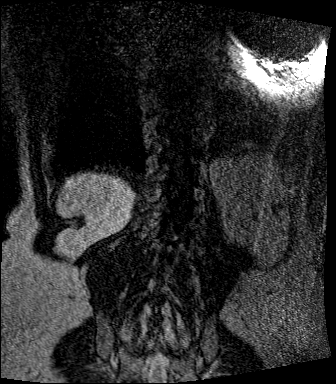

[Series 9: angio_fl3d_cor_post · coronal · 1.1mm · 0.91mm/px · 7 of 104 slices shown (1 of 2)]
[im 1/104]
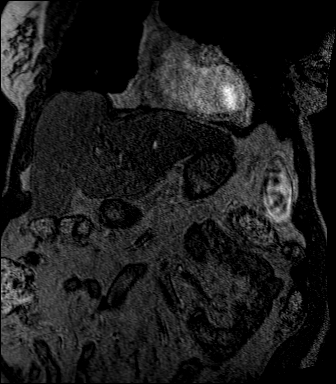
[im 18/104]
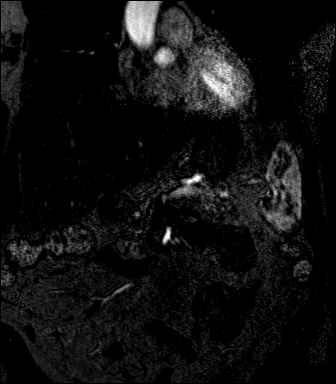
[im 35/104]
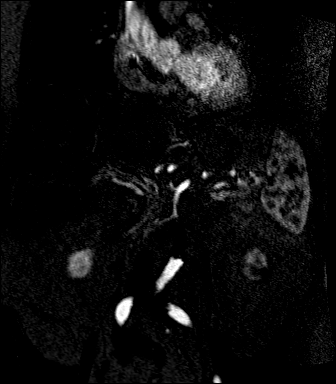
[im 52/104]
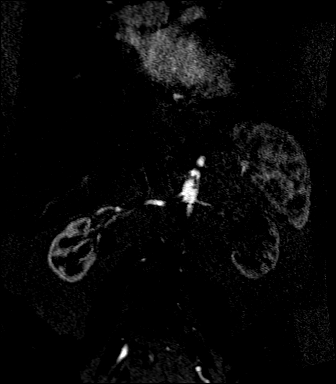
[im 69/104]
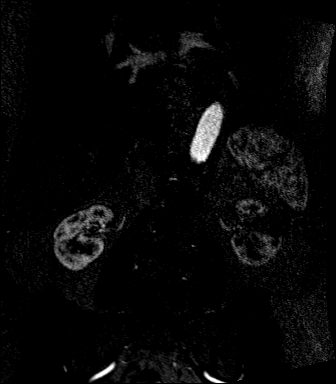
[im 86/104]
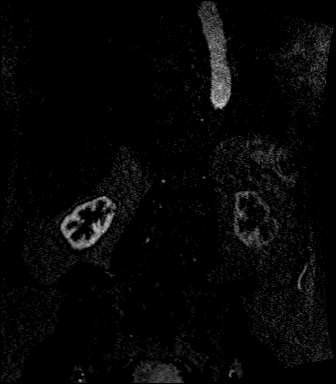
[im 104/104]
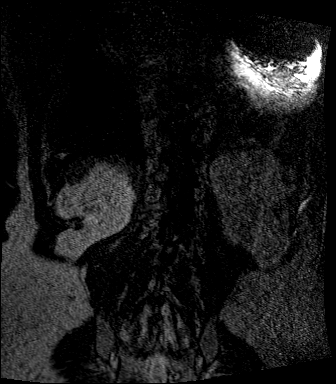

[Series 10: angio_fl3d_cor_post_sub · coronal · 1.1mm · 0.91mm/px · 6 of 102 slices shown (1 of 2)]
[im 1/102]
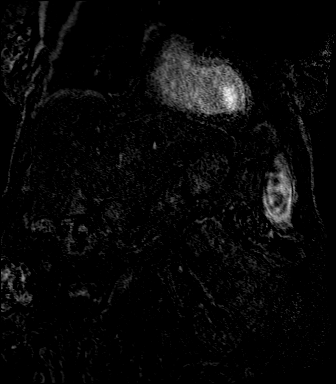
[im 21/102]
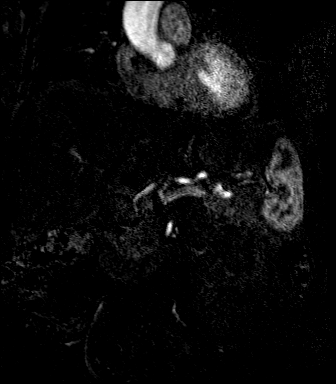
[im 41/102]
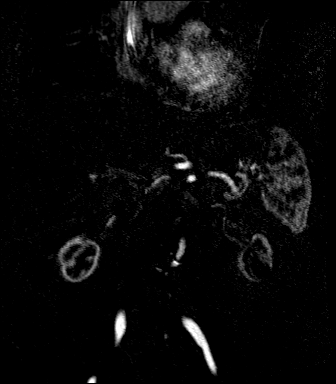
[im 61/102]
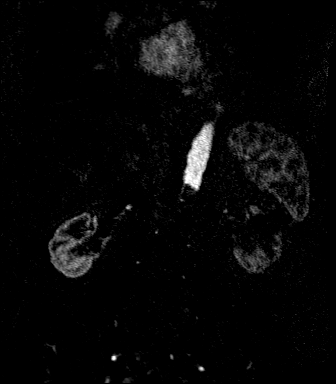
[im 81/102]
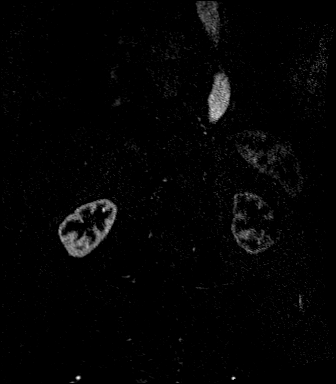
[im 102/102]
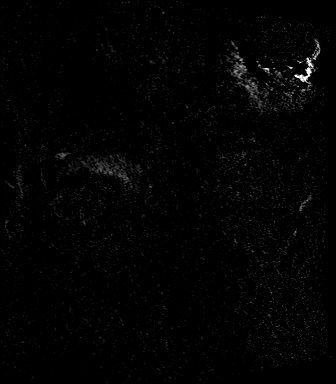

[Series 12: angio_fl3d_cor_post · coronal · 1.1mm · 0.91mm/px · 7 of 104 slices shown (2 of 2)]
[im 1/104]
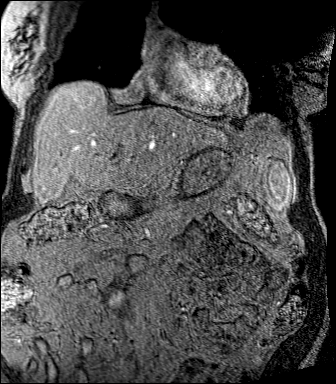
[im 18/104]
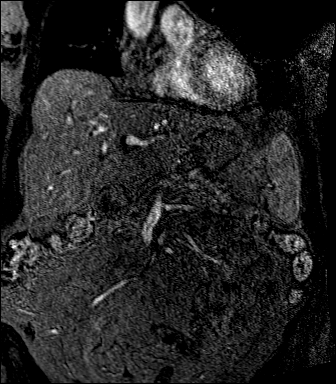
[im 35/104]
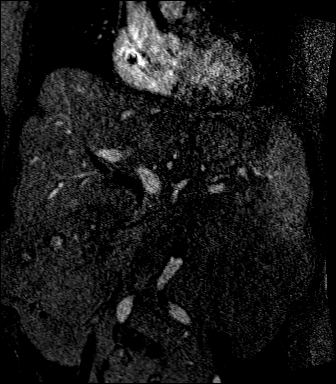
[im 52/104]
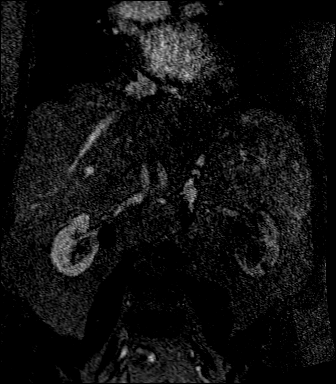
[im 69/104]
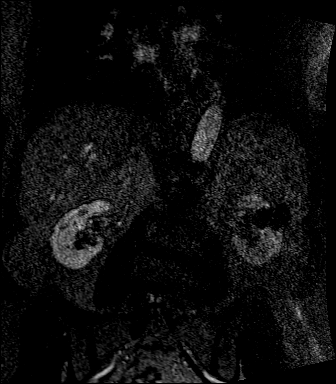
[im 86/104]
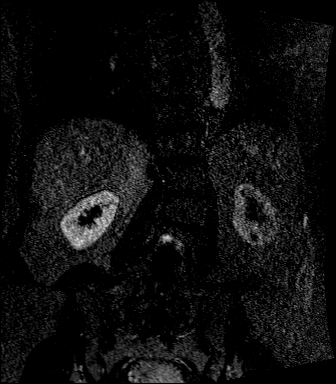
[im 104/104]
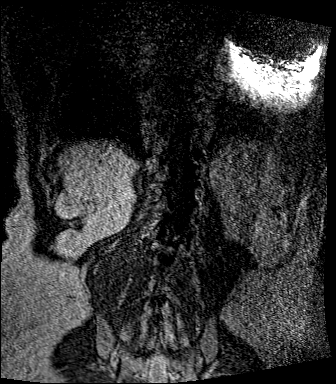

[Series 13: angio_fl3d_cor_post_sub · coronal · 1.1mm · 0.91mm/px · 6 of 104 slices shown (2 of 2)]
[im 1/104]
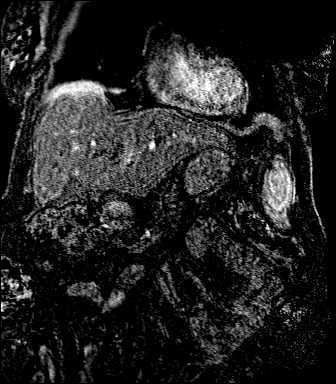
[im 18/104]
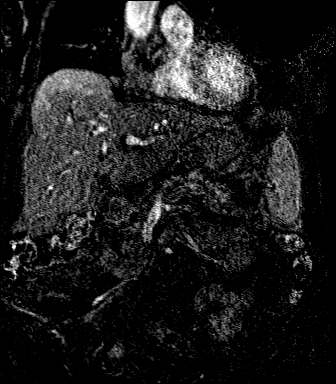
[im 35/104]
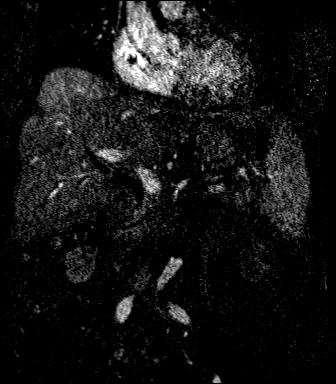
[im 52/104]
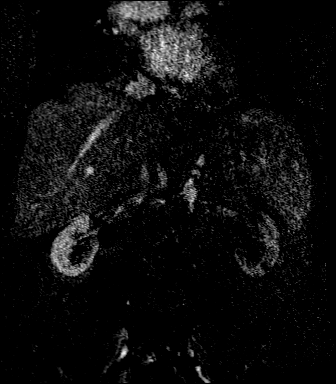
[im 69/104]
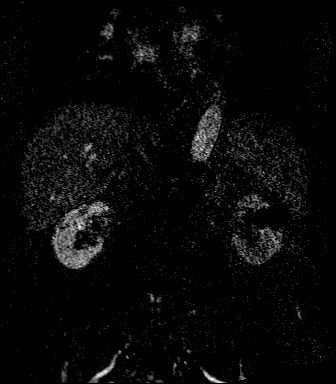
[im 86/104]
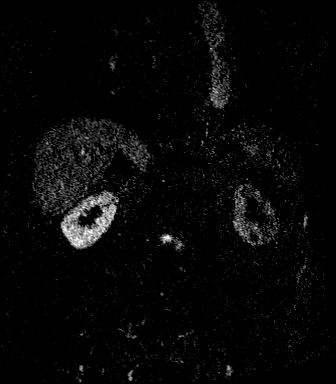

[36 of 48 positions shown; findings below may reference images not displayed]

FINDINGS: A flow defect is present extending from the immediate infrarenal
aorta into the distal aorta just above the IMA origin. This is at
the level of the previously placed covered aortic stent. Some of the
lumen is visible with contrast but for the most part there is a void
created by artifact from the metallic component of the stent.
Therefore, accurate assessment of degree of stent patency cannot be
assessed. However, the stent is clearly patent as opacification of
the distal aorta and iliac arteries appears equal to that of the
proximal aorta on the contrast enhanced MRA.

No evidence of aortic aneurysm. Visceral arteries demonstrate no
significant disease with normal patency of the visualized celiac
axis, celiac branches, SMA and IMA. Two separate left renal arteries
demonstrate normal patency. A single right renal artery demonstrates
probable mild atherosclerosis with less than 50% origin stenosis.

Similar flow void with partial visualization of internal stent lumen
from the level of the left common iliac artery origin to the mid
common iliac artery. Due to artifact from the stent, exact degree of
stent patency cannot be assessed by MRA. The stent is clearly patent
with good opacification of the non stented segment of the common
iliac artery and visualized internal and external iliac arteries.
There may be some mild irregularity at the end of the left common
iliac artery stent and the native vessel appears potentially mildly
dilated relative to the distal stent diameter. Proximal right common
iliac artery demonstrates mild disease without significant stenosis.
The visualized right external and internal iliac arteries
demonstrate normal patency.

Abdominal organs appear unremarkable. The liver appears normal. The
gallbladder is either contracted or surgically absent. No biliary
ductal dilatation. Pancreas, spleen and adrenal glands appear
normal. Benign-appearing cysts are present in the left kidney. No
hydronephrosis. No lymphadenopathy. Visualized bowel is
unremarkable. No abnormal fluid collections. No abdominal wall
hernias identified.
IMPRESSION: Flow void defects at the level of the previously stented mid
abdominal aorta and proximal left common iliac artery. Defects are
created by the previously placed covered stents with partial
visualization internal stent lumens. Accurate assessment of the
degree of stent patency cannot be assessed by MRA. However, both
stents are clearly patent as opacification of segments distal to the
stents appears normal by MRA. There may be some mild irregularity at
the level of the distal end of the left common iliac artery stent.
If there is suspicion of significant intraluminal stenoses of either
stent based on clinical findings, CTA would be a better modality to
assess stent patency in the future.

## 2022-11-06 ENCOUNTER — Other Ambulatory Visit: Payer: Self-pay | Admitting: Cardiology

## 2022-11-24 ENCOUNTER — Ambulatory Visit: Payer: Medicare PPO | Admitting: Podiatry

## 2022-11-29 ENCOUNTER — Ambulatory Visit: Payer: Medicare PPO | Admitting: Podiatry

## 2022-11-29 DIAGNOSIS — M79674 Pain in right toe(s): Secondary | ICD-10-CM | POA: Diagnosis not present

## 2022-11-29 DIAGNOSIS — L84 Corns and callosities: Secondary | ICD-10-CM | POA: Diagnosis not present

## 2022-11-29 DIAGNOSIS — E1151 Type 2 diabetes mellitus with diabetic peripheral angiopathy without gangrene: Secondary | ICD-10-CM | POA: Diagnosis not present

## 2022-11-29 DIAGNOSIS — B351 Tinea unguium: Secondary | ICD-10-CM | POA: Diagnosis not present

## 2022-11-29 DIAGNOSIS — M79675 Pain in left toe(s): Secondary | ICD-10-CM

## 2022-11-29 NOTE — Progress Notes (Signed)
Subjective:  Patient ID: Shari [name], female    DOB: [birth year],  MRN: [id number]  Lisa Roca Rastetter presents to clinic today for:  Chief Complaint  Patient presents with   Nail Problem    Diabetic Foot Care-nail trim    Callouses    Trimming of corns- one to right ball of foot near the hallux and left 5th toe.    Patient notes nails are thick and elongated, causing pain in shoe gear when ambulating.  She also notes corns on the left fifth toe and right bunion  PCP is [name], Doris Cheadle, MD. date last seen was 11/06/2022.  Past Medical History:  Diagnosis Date   Anxiety    Automatic implantable cardioverter-defibrillator in situ 10/25/2014   Overview:  Overview:  dual chamber ICD,upgraded to CRT with LBBB and nonischemic cardiomyopathy   Biventricular ICD (implantable cardioverter-defibrillator) in place 10/25/2014   Overview:  BIV -ICD,upgraded to CRT with LBBB and nonischemic cardiomyopathy   Chronic combined systolic and diastolic heart failure (HCC) 10/25/2014   Overview:  EF 77% by MUGA 06/15/15  Formatting of this note might be different from the original. Overview:  EF 66% by MUGA 03/12/14  Overview:  Overview:  EF 77% by MUGA 06/15/15  Overview:  EF 66% by MUGA 03/12/14  Overview:  Overview:  EF 77% by MUGA 06/15/15 Formatting of this note might be different from the original. EF 66% by MUGA 03/12/14   Chronic coronary artery disease 10/25/2014   Chronic kidney disease, stage 3 (HCC) 12/05/2016   Combined systolic and diastolic congestive heart failure (HCC) 10/25/2014   Last Assessment & Plan:  Formatting of this note might be different from the original. Type and degree unknown though patient currently appears to be euvolemic.   Coronary artery disease 10/25/2014   Costochondritis 10/25/2014   Diabetes mellitus 2 years   Diverticulitis    Dizziness 06/30/2018   Last Assessment & Plan:  Formatting of this note might be different from the original. 88 yo F with history of CHF, DM, PVD,  h/o TIA with double vision in 2013, HTN, HLD.   Yesterday morning she reports that when she woke up and looked over at her clock it looked blurry.  She denies double vision, facial droop or extremity weakness.  When she tried to get up to ambulate she reports that she felt d   Dysuria 06/15/2017   Elevated troponin 06/04/2016   Last Assessment & Plan:  Likely may be related to ventricular pacing and underlying cardiomyopathy.  The trajectory of the biomarker elevation and neither is her clinical history suggestive of acute coronary syndrome.  Further stratification with nuclear stress testing   Essential hypertension 06/04/2016   Last Assessment & Plan:  Formatting of this note might be different from the original. Continue carvedilol   Gastroesophageal reflux disease 11/10/2010   Last Assessment & Plan:  Formatting of this note might be different from the original. Continue Dexilant.   GERD (gastroesophageal reflux disease)    Glaucoma    Gout 12/02/2016   High cholesterol    History of ischemic stroke 06/30/2018   Hypertension    Hypertensive heart and kidney disease with heart failure and with chronic kidney disease stage III (HCC) 11/10/2010   Hypertensive heart disease with heart failure (HCC) 10/25/2014   Hypokalemia 06/04/2016   Last Assessment & Plan:  Replace with potassium chloride.   IBS (irritable bowel syndrome)    ICD (implantable cardioverter-defibrillator) in place  10/25/2014   Overview:  Overview:  dual chamber ICD,upgraded to CRT with LBBB and nonischemic cardiomyopathy  Formatting of this note might be different from the original. Overview:  dual chamber ICD,upgraded to CRT with LBBB and nonischemic cardiomyopathy  Overview:  Overview:  BIV -ICD,upgraded to CRT with LBBB and nonischemic cardiomyopathy  Arturo Morton, CMA - 09/07/2016 3:00 PM EDT REMOTE MONITORING ASSE   LBBB (left bundle branch block) 12/02/2016   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) 05/21/2015   Migraine  12/02/2016   Mild CAD 10/25/2014   Mixed hyperlipidemia 11/10/2010   Last Assessment & Plan:  Formatting of this note might be different from the original. Continue statin therapy.   Nephrolithiasis 12/02/2016   Onychomycosis 11/10/2010   Osteoarthritis    PAD (peripheral artery disease) (HCC) 11/03/2014   Overview:  status post insertion of aortic stent and left common iliac stent by Dr. Imogene Burn in August of 2016   Peripheral arterial occlusive disease (HCC) 11/03/2014   Precordial chest pain 06/04/2016   Last Assessment & Plan:  My suspicion is that this is likely related to gastroesophageal reflux disease with acute indigestion last evening.  The biomarker elevation is not completely unexpected in this elderly patient with reported cardiomyopathy and current demand ventricular pacing.  Nonetheless she does have a history of extensive vascular disease (though apparently no significant coronary atherosclerosis in 2013) so we will proceed with a noninvasive risk stratification with pharmacological nuclear stress testing.  Assuming this is a low risk or normal study then I would not advise any invasive cardiac evaluation at this time.   Preoperative cardiovascular examination 04/02/2015   Rectal bleeding 11/19/2017   Formatting of this note might be different from the original. 2019   Renal artery stenosis (HCC) 12/02/2016   Rheumatoid arthritis involving multiple sites (HCC) 05/21/2015   Screening for breast cancer 04/02/2015   Stroke [facility])    Type 2 diabetes mellitus (HCC) 11/10/2010   Last Assessment & Plan:  Formatting of this note might be different from the original. Continue glipizide and add sliding scale insulin coverage Formatting of this note might be different from the original. Per 01/28/2019 Podiatry VN.   Vitamin D deficiency 06/20/2016    Allergies  Allergen Reactions   Doxycycline Nausea Only    Other Reaction(s): GI Intolerance   Metformin Nausea Only and Other (See Comments)    Other  Reaction(s): GI Intolerance   Codeine Diarrhea, Nausea And Vomiting, Other (See Comments) and Hives    All-over body aches, too Other reaction(s): GI intolerance   Dulaglutide Other (See Comments)    Chest pain, N/V  Other Reaction(s): GI Intolerance   Ivp Dye [Iodinated Contrast Media] Nausea And Vomiting and Other (See Comments)   Metrizamide Nausea And Vomiting and Other (See Comments)   Pioglitazone Other (See Comments)    Has CHF   Review of Systems: Negative except as noted in the HPI.  Objective:  There were no vitals filed for this visit.  DELSY MCCOURY is a pleasant 88 y.o. female in NAD. AAO x 3.  Vascular Examination: Patient has palpable DP pulse, absent PT pulse bilateral.  Delayed capillary refill bilateral toes.  Sparse digital hair bilateral.  Proximal to distal cooling WNL bilateral.    Dermatological Examination: Interspaces are clear with no open lesions noted bilateral.  Skin is shiny and atrophic bilateral.  Nails are 3-73mm thick, with yellowish/brown discoloration, subungual debris and distal onycholysis x10.  There is pain with compression of  nails x10.  There are hyperkeratotic lesions noted on the dorsal lateral aspect of the left fifth toe PIPJ as well as the plantar medial aspect of the right bunion/first MPJ.  Patient qualifies for at-risk foot care because of diabetes with PVD.  Assessment/Plan: 1. Pain due to onychomycosis of toenails of both feet   2. Type II diabetes mellitus with peripheral circulatory disorder (HCC)   3. Callus    Mycotic nails x10 were sharply debrided with sterile nail nippers and power debriding burr to decrease bulk and length.  Hyperkeratotic lesions x 2 were shaved with #312 blade.  Follow-up in approximately 3 months for at risk footcare with her husband also being seen on the same day   Betzabeth Derringer DBurna Mortimer, DPM, FACFAS [facility]     [address] [place],  [place] [address]                Office [phone]  Fax [phone]

## 2022-12-01 ENCOUNTER — Other Ambulatory Visit: Payer: Self-pay | Admitting: Cardiology

## 2022-12-01 DIAGNOSIS — I739 Peripheral vascular disease, unspecified: Secondary | ICD-10-CM

## 2022-12-01 NOTE — Telephone Encounter (Signed)
Rx refill sent to pharmacy. 

## 2022-12-18 ENCOUNTER — Other Ambulatory Visit: Payer: Self-pay | Admitting: Cardiology

## 2022-12-20 ENCOUNTER — Ambulatory Visit (INDEPENDENT_AMBULATORY_CARE_PROVIDER_SITE_OTHER): Payer: Medicare PPO

## 2022-12-20 DIAGNOSIS — I428 Other cardiomyopathies: Secondary | ICD-10-CM | POA: Diagnosis not present

## 2022-12-20 LAB — CUP PACEART REMOTE DEVICE CHECK
Battery Remaining Longevity: 19 mo
Battery Remaining Percentage: 34 %
Battery Voltage: 2.89 V
Brady Statistic AP VP Percent: 1 %
Brady Statistic AP VS Percent: 1 %
Brady Statistic AS VP Percent: 99 %
Brady Statistic AS VS Percent: 1 %
Brady Statistic RA Percent Paced: 1 %
Date Time Interrogation Session: 20240918020009
HighPow Impedance: 57 Ohm
Implantable Lead Connection Status: 753985
Implantable Lead Connection Status: 753985
Implantable Lead Connection Status: 753985
Implantable Lead Implant Date: 20140702
Implantable Lead Implant Date: 20140702
Implantable Lead Implant Date: 20150730
Implantable Lead Location: 753858
Implantable Lead Location: 753859
Implantable Lead Location: 753860
Implantable Lead Model: 7122
Implantable Pulse Generator Implant Date: 20210322
Lead Channel Impedance Value: 340 Ohm
Lead Channel Impedance Value: 430 Ohm
Lead Channel Impedance Value: 460 Ohm
Lead Channel Pacing Threshold Amplitude: 0.75 V
Lead Channel Pacing Threshold Amplitude: 1 V
Lead Channel Pacing Threshold Amplitude: 2.5 V
Lead Channel Pacing Threshold Pulse Width: 0.4 ms
Lead Channel Pacing Threshold Pulse Width: 0.5 ms
Lead Channel Pacing Threshold Pulse Width: 1.2 ms
Lead Channel Sensing Intrinsic Amplitude: 1.4 mV
Lead Channel Sensing Intrinsic Amplitude: 9.5 mV
Lead Channel Setting Pacing Amplitude: 2 V
Lead Channel Setting Pacing Amplitude: 2.5 V
Lead Channel Setting Pacing Amplitude: 3.25 V
Lead Channel Setting Pacing Pulse Width: 0.4 ms
Lead Channel Setting Pacing Pulse Width: 1.2 ms
Lead Channel Setting Sensing Sensitivity: 0.5 mV
Pulse Gen Serial Number: 810001491
Zone Setting Status: 755011

## 2022-12-26 NOTE — Progress Notes (Signed)
Remote ICD transmission.   

## 2023-01-03 NOTE — Progress Notes (Signed)
Remote ICD transmission.   

## 2023-01-21 ENCOUNTER — Other Ambulatory Visit: Payer: Self-pay | Admitting: Cardiology

## 2023-02-23 ENCOUNTER — Ambulatory Visit: Payer: Medicare PPO | Admitting: Podiatry

## 2023-03-21 ENCOUNTER — Ambulatory Visit (INDEPENDENT_AMBULATORY_CARE_PROVIDER_SITE_OTHER): Payer: Medicare PPO

## 2023-03-21 DIAGNOSIS — I428 Other cardiomyopathies: Secondary | ICD-10-CM | POA: Diagnosis not present

## 2023-03-21 DIAGNOSIS — I5042 Chronic combined systolic (congestive) and diastolic (congestive) heart failure: Secondary | ICD-10-CM

## 2023-03-21 LAB — CUP PACEART REMOTE DEVICE CHECK
Battery Remaining Longevity: 19 mo
Battery Remaining Percentage: 33 %
Battery Voltage: 2.89 V
Brady Statistic AP VP Percent: 1 %
Brady Statistic AP VS Percent: 1 %
Brady Statistic AS VP Percent: 99 %
Brady Statistic AS VS Percent: 1 %
Brady Statistic RA Percent Paced: 1 %
Date Time Interrogation Session: 20241218010021
HighPow Impedance: 61 Ohm
Implantable Lead Connection Status: 753985
Implantable Lead Connection Status: 753985
Implantable Lead Connection Status: 753985
Implantable Lead Implant Date: 20140702
Implantable Lead Implant Date: 20140702
Implantable Lead Implant Date: 20150730
Implantable Lead Location: 753858
Implantable Lead Location: 753859
Implantable Lead Location: 753860
Implantable Lead Model: 7122
Implantable Pulse Generator Implant Date: 20210322
Lead Channel Impedance Value: 360 Ohm
Lead Channel Impedance Value: 430 Ohm
Lead Channel Impedance Value: 500 Ohm
Lead Channel Pacing Threshold Amplitude: 0.75 V
Lead Channel Pacing Threshold Amplitude: 1 V
Lead Channel Pacing Threshold Amplitude: 2.5 V
Lead Channel Pacing Threshold Pulse Width: 0.4 ms
Lead Channel Pacing Threshold Pulse Width: 0.5 ms
Lead Channel Pacing Threshold Pulse Width: 1.2 ms
Lead Channel Sensing Intrinsic Amplitude: 1.7 mV
Lead Channel Sensing Intrinsic Amplitude: 11.4 mV
Lead Channel Setting Pacing Amplitude: 2 V
Lead Channel Setting Pacing Amplitude: 2.5 V
Lead Channel Setting Pacing Amplitude: 3.25 V
Lead Channel Setting Pacing Pulse Width: 0.4 ms
Lead Channel Setting Pacing Pulse Width: 1.2 ms
Lead Channel Setting Sensing Sensitivity: 0.5 mV
Pulse Gen Serial Number: 810001491
Zone Setting Status: 755011

## 2023-03-22 ENCOUNTER — Ambulatory Visit: Payer: Medicare PPO | Admitting: Podiatry

## 2023-03-23 ENCOUNTER — Ambulatory Visit: Payer: Medicare PPO | Admitting: Podiatry

## 2023-03-23 DIAGNOSIS — M79675 Pain in left toe(s): Secondary | ICD-10-CM

## 2023-03-23 DIAGNOSIS — L84 Corns and callosities: Secondary | ICD-10-CM

## 2023-03-23 DIAGNOSIS — M79674 Pain in right toe(s): Secondary | ICD-10-CM | POA: Diagnosis not present

## 2023-03-23 DIAGNOSIS — E1151 Type 2 diabetes mellitus with diabetic peripheral angiopathy without gangrene: Secondary | ICD-10-CM | POA: Diagnosis not present

## 2023-03-23 DIAGNOSIS — B351 Tinea unguium: Secondary | ICD-10-CM

## 2023-03-23 NOTE — Progress Notes (Unsigned)
Subjective:  Patient ID: Shari [name], female    DOB: [birth year],  MRN: [id number]  Shari [name] presents to clinic today for:  Chief Complaint  Patient presents with   Foot Care    Last A1c: 7.3. Takes clopidogrel. Has some red spots on her RT foot that she would like for you to look at.    Patient notes nails are thick and elongated, causing pain in shoe gear when ambulating.  She also has a painful corn on the left fifth toe and right submet 1  PCP is [name], Doris Cheadle, MD. last seen 02/02/2023  Past Medical History:  Diagnosis Date   Anxiety    Automatic implantable cardioverter-defibrillator in situ 10/25/2014   Overview:  Overview:  dual chamber ICD,upgraded to CRT with LBBB and nonischemic cardiomyopathy   Biventricular ICD (implantable cardioverter-defibrillator) in place 10/25/2014   Overview:  BIV -ICD,upgraded to CRT with LBBB and nonischemic cardiomyopathy   Chronic combined systolic and diastolic heart failure (HCC) 10/25/2014   Overview:  EF 77% by MUGA 06/15/15  Formatting of this note might be different from the original. Overview:  EF 66% by MUGA 03/12/14  Overview:  Overview:  EF 77% by MUGA 06/15/15  Overview:  EF 66% by MUGA 03/12/14  Overview:  Overview:  EF 77% by MUGA 06/15/15 Formatting of this note might be different from the original. EF 66% by MUGA 03/12/14   Chronic coronary artery disease 10/25/2014   Chronic kidney disease, stage 3 (HCC) 12/05/2016   Combined systolic and diastolic congestive heart failure (HCC) 10/25/2014   Last Assessment & Plan:  Formatting of this note might be different from the original. Type and degree unknown though patient currently appears to be euvolemic.   Coronary artery disease 10/25/2014   Costochondritis 10/25/2014   Diabetes mellitus 2 years   Diverticulitis    Dizziness 06/30/2018   Last Assessment & Plan:  Formatting of this note might be different from the original. 88 yo F with history of CHF, DM, PVD, h/o TIA with double  vision in 2013, HTN, HLD.   Yesterday morning she reports that when she woke up and looked over at her clock it looked blurry.  She denies double vision, facial droop or extremity weakness.  When she tried to get up to ambulate she reports that she felt d   Dysuria 06/15/2017   Elevated troponin 06/04/2016   Last Assessment & Plan:  Likely may be related to ventricular pacing and underlying cardiomyopathy.  The trajectory of the biomarker elevation and neither is her clinical history suggestive of acute coronary syndrome.  Further stratification with nuclear stress testing   Essential hypertension 06/04/2016   Last Assessment & Plan:  Formatting of this note might be different from the original. Continue carvedilol   Gastroesophageal reflux disease 11/10/2010   Last Assessment & Plan:  Formatting of this note might be different from the original. Continue Dexilant.   GERD (gastroesophageal reflux disease)    Glaucoma    Gout 12/02/2016   High cholesterol    History of ischemic stroke 06/30/2018   Hypertension    Hypertensive heart and kidney disease with heart failure and with chronic kidney disease stage III (HCC) 11/10/2010   Hypertensive heart disease with heart failure (HCC) 10/25/2014   Hypokalemia 06/04/2016   Last Assessment & Plan:  Replace with potassium chloride.   IBS (irritable bowel syndrome)    ICD (implantable cardioverter-defibrillator) in place 10/25/2014   Overview:  Overview:  dual chamber ICD,upgraded to CRT with LBBB and nonischemic cardiomyopathy  Formatting of this note might be different from the original. Overview:  dual chamber ICD,upgraded to CRT with LBBB and nonischemic cardiomyopathy  Overview:  Overview:  BIV -ICD,upgraded to CRT with LBBB and nonischemic cardiomyopathy  Arturo Morton, CMA - 09/07/2016 3:00 PM EDT REMOTE MONITORING ASSE   LBBB (left bundle branch block) 12/02/2016   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) 05/21/2015   Migraine 12/02/2016   Mild  CAD 10/25/2014   Mixed hyperlipidemia 11/10/2010   Last Assessment & Plan:  Formatting of this note might be different from the original. Continue statin therapy.   Nephrolithiasis 12/02/2016   Onychomycosis 11/10/2010   Osteoarthritis    PAD (peripheral artery disease) (HCC) 11/03/2014   Overview:  status post insertion of aortic stent and left common iliac stent by Dr. Imogene Burn in August of 2016   Peripheral arterial occlusive disease (HCC) 11/03/2014   Precordial chest pain 06/04/2016   Last Assessment & Plan:  My suspicion is that this is likely related to gastroesophageal reflux disease with acute indigestion last evening.  The biomarker elevation is not completely unexpected in this elderly patient with reported cardiomyopathy and current demand ventricular pacing.  Nonetheless she does have a history of extensive vascular disease (though apparently no significant coronary atherosclerosis in 2013) so we will proceed with a noninvasive risk stratification with pharmacological nuclear stress testing.  Assuming this is a low risk or normal study then I would not advise any invasive cardiac evaluation at this time.   Preoperative cardiovascular examination 04/02/2015   Rectal bleeding 11/19/2017   Formatting of this note might be different from the original. 2019   Renal artery stenosis (HCC) 12/02/2016   Rheumatoid arthritis involving multiple sites (HCC) 05/21/2015   Screening for breast cancer 04/02/2015   Stroke [facility])    Type 2 diabetes mellitus (HCC) 11/10/2010   Last Assessment & Plan:  Formatting of this note might be different from the original. Continue glipizide and add sliding scale insulin coverage Formatting of this note might be different from the original. Per 01/28/2019 Podiatry VN.   Vitamin D deficiency 06/20/2016    Allergies  Allergen Reactions   Doxycycline Nausea Only    Other Reaction(s): GI Intolerance   Metformin Nausea Only and Other (See Comments)    Other Reaction(s): GI Intolerance    Codeine Diarrhea, Nausea And Vomiting, Other (See Comments) and Hives    All-over body aches, too Other reaction(s): GI intolerance   Dulaglutide Other (See Comments)    Chest pain, N/V  Other Reaction(s): GI Intolerance   Ivp Dye [Iodinated Contrast Media] Nausea And Vomiting and Other (See Comments)   Metrizamide Nausea And Vomiting and Other (See Comments)   Pioglitazone Other (See Comments)    Has CHF    Objective:  Shari [name] is a pleasant 88 y.o. female in NAD. AAO x 3.  Vascular Examination: Patient has palpable DP pulse, absent PT pulse bilateral.  Delayed capillary refill bilateral toes.  Sparse digital hair bilateral.  Proximal to distal cooling WNL bilateral.    Dermatological Examination: Interspaces are clear with no open lesions noted bilateral.  Skin is shiny and atrophic bilateral.  Nails are 3-54mm thick, with yellowish/brown discoloration, subungual debris and distal onycholysis x10.  There is pain with compression of nails x10.  There are hyperkeratotic lesions noted left fifth toe dorsal lateral PIPJ and right submet 1.  Patient qualifies for at-risk foot  care because of diabetes with PVD.  Assessment/Plan: 1. Pain due to onychomycosis of toenails of both feet   2. Corns and callosities   3. Type II diabetes mellitus with peripheral circulatory disorder (HCC)     Mycotic nails x10 were sharply debrided with sterile nail nippers and power debriding burr to decrease bulk and length.  Hyperkeratotic lesions x 2 were shaved with #312 blade.   Return in about 3 months (around 06/21/2023) for [facility].   Clerance Lav, DPM, FACFAS [facility]     [address] [place], [place] [address]                Office [phone]  Fax [phone]

## 2023-03-26 ENCOUNTER — Telehealth: Payer: Self-pay | Admitting: Cardiology

## 2023-03-26 NOTE — Telephone Encounter (Signed)
Pt c/o of Chest Pain: STAT if CP now or developed within 24 hours  1. Are you having CP right now? Some tightness   2. Are you experiencing any other symptoms (ex. SOB, nausea, vomiting, sweating)? Nausea, weakness, sob   3. How long have you been experiencing CP? Couple weeks   4. Is your CP continuous or coming and going? Coming and going   5. Have you taken Nitroglycerin? no ?

## 2023-03-26 NOTE — Telephone Encounter (Signed)
Spoke with Pt and her question was regarding her pacemaker and the symptoms that she may have if her battery was becoming low. She said last check she was told she had a year and a half.Discussed that she may have to go to urgent care or PCP for her nausea and headache symptoms. Will send to Dr. Elberta Fortis team for questions regarding low pacemaker battery.

## 2023-03-26 NOTE — Telephone Encounter (Signed)
Attempted to call line was busy.

## 2023-03-27 NOTE — Telephone Encounter (Signed)
Called patient to advise she will not be able to tell when her battery gets low based on her symptoms. Advised pt her last remote transmission was WNL. Pt appreciative of call.

## 2023-03-30 ENCOUNTER — Telehealth: Payer: Self-pay | Admitting: Cardiology

## 2023-03-30 MED ORDER — NITROGLYCERIN 0.4 MG SL SUBL: 0.4000 mg | SUBLINGUAL_TABLET | SUBLINGUAL | 8 refills | Status: DC | PRN

## 2023-03-30 MED ORDER — NITROGLYCERIN 0.4 MG SL SUBL: 0.4000 mg | SUBLINGUAL_TABLET | SUBLINGUAL | 8 refills | Status: AC | PRN

## 2023-03-30 NOTE — Telephone Encounter (Signed)
Spoke with pt who states that she has chest tightness with nausea that has been ongoing for 1 1/2 weeks. Pt states that she saw Dr. Sol Passer 03/29/23 who recommends that she sees Dr. Dulce Sellar sooner than 04/17/22. I advised the pt to try her NTG which she was out so new RX was sent to the pharmacy. I advised pt to go to the ED for evaluation as she states she gets short of breath and the tightness is not improving. Pt was agreeable to plan and had no additional questions.

## 2023-03-30 NOTE — Telephone Encounter (Signed)
Pt states she needs to speak with a nurse. Please advise

## 2023-03-30 NOTE — Telephone Encounter (Signed)
Number busy times 2

## 2023-04-04 NOTE — Progress Notes (Signed)
 Cardiology Office Note:    Date:  04/06/2023   ID:  WANNA GULLY, DOB 06/20/1934, MRN [id number]  PCP:  Ofilia Lamar CROME, MD  Cardiologist:  Redell Leiter, MD    Referring MD: Ofilia Lamar CROME, MD    ASSESSMENT:    1. Coronary artery disease involving native coronary artery of native heart without angina pectoris   2. Hypertensive heart and kidney disease with heart failure and with chronic kidney disease stage III (HCC)   3. Biventricular ICD (implantable cardioverter-defibrillator) in place   4. Dyslipidemia    PLAN:    In order of problems listed above:  She has a history of remote coronary angiogram before 2017 with mild nonobstructive CAD continue her medical therapy beta-blocker ranolazine  oral nitrate Recent episode of chest pain ED visit no evidence of ACS As opposed to cardiac CTA with her CAD and pacemaker we will do a myocardial perfusion study and decide if we need to consider coronary angiography with renal protection and PCI She is on good medical therapy for cardiomyopathy continue her loop diuretic SGLT2 inhibitor beta-blocker await echocardiogram if EF is reduced addition Entresto Stable device function recently checked in our office 03/24/2023 Continue her high intensity statin with PAD and CAD   Next appointment: 3 months   Medication Adjustments/Labs and Tests Ordered: Current medicines are reviewed at length with the patient today.  Concerns regarding medicines are outlined above.  No orders of the defined types were placed in this encounter.  No orders of the defined types were placed in this encounter.    History of Present Illness:    ALONA DANFORD is a 89 y.o. female with a hx of hx of nonischemic cardiomyopathy due to left bundle branch block CRT/D therapy and normalization of ejection fraction mild nonobstructive CAD hypertensive heart disease with stage III CKD dyslipidemia peripheral arterial disease with previous PCI and stent left common carotid  artery last seen 07/25/2022.  [facility] ED with chest pain 03/24/2023 and elected to be discharged home and follow-up with her cardiology.  Compliance with diet, lifestyle and medications: Yes  Is a very stressful time in her life with her husband's progressive deterioration and plans to move to assisted living An episode of anginal discomfort seen in the ED no evidence of MI she decided to come home and be evaluated as an outpatient and no further symptoms has not needed nitroglycerin  She is concerned because she also feels increasingly fatigued. No edema shortness of breath palpitation or syncope no further chest pain For further evaluation especially with her CKD she will be set up for Lexiscan  perfusion study and echocardiogram my office if she had findings of significant ischemia may benefit from PCI and stent but I think at embrace medical therapy unless she had high risk markers. Past Medical History:  Diagnosis Date   After cataract not obscuring vision, bilateral 05/21/2021   Anxiety    Atypical chest pain 08/24/2021   Automatic implantable cardioverter-defibrillator in situ 10/25/2014   Overview:  Overview:  dual chamber ICD,upgraded to CRT with LBBB and nonischemic cardiomyopathy   Biventricular ICD (implantable cardioverter-defibrillator) in place 10/25/2014   Overview:  BIV -ICD,upgraded to CRT with LBBB and nonischemic cardiomyopathy   Chronic combined systolic and diastolic heart failure (HCC) 10/25/2014   Overview:  EF 77% by MUGA 06/15/15  Formatting of this note might be different from the original. Overview:  EF 66% by MUGA 03/12/14  Overview:  Overview:  EF 77% by MUGA 06/15/15  Overview:  EF 66% by MUGA 03/12/14  Overview:  Overview:  EF 77% by MUGA 06/15/15 Formatting of this note might be different from the original. EF 66% by MUGA 03/12/14   Chronic coronary artery disease 10/25/2014   Chronic kidney disease, stage 3 (HCC) 12/05/2016   Combined systolic and  diastolic congestive heart failure (HCC) 10/25/2014   Last Assessment & Plan:  Formatting of this note might be different from the original. Type and degree unknown though patient currently appears to be euvolemic.   Community acquired pneumonia of right lung 07/17/2022   07/17/2022     Coronary artery disease 10/25/2014   Costochondritis 10/25/2014   COVID-19 12/20/2020   Diabetes mellitus 2 years   Diverticulitis    Dizziness 06/30/2018   Last Assessment & Plan:  Formatting of this note might be different from the original. 89 yo F with history of CHF, DM, PVD, h/o TIA with double vision in 2013, HTN, HLD.   Yesterday morning she reports that when she woke up and looked over at her clock it looked blurry.  She denies double vision, facial droop or extremity weakness.  When she tried to get up to ambulate she reports that she felt d   Dry eye syndrome of both lacrimal glands 05/21/2021   Dysuria 06/15/2017   Elevated troponin 06/04/2016   Last Assessment & Plan:  Likely may be related to ventricular pacing and underlying cardiomyopathy.  The trajectory of the biomarker elevation and neither is her clinical history suggestive of acute coronary syndrome.  Further stratification with nuclear stress testing   Essential hypertension 06/04/2016   Last Assessment & Plan:  Formatting of this note might be different from the original. Continue carvedilol   Fuchs' corneal dystrophy of both eyes 05/21/2021   Gastroesophageal reflux disease 11/10/2010   Last Assessment & Plan:  Formatting of this note might be different from the original. Continue Dexilant.   GERD (gastroesophageal reflux disease)    Giant cell arteritis (HCC) 05/15/2022   05/15/2022: see eye doctor concerns     Glaucoma    Glaucoma 11/10/2010   IMO SNOMED Dx Update Oct 2024     Gout 12/02/2016   High cholesterol    History of ischemic stroke 06/30/2018   Hordeolum externum of right upper eyelid 04/12/2022   04/12/2022      Hypertension    Hypertensive heart and kidney disease with heart failure and with chronic kidney disease stage III (HCC) 11/10/2010   Hypertensive heart disease with heart failure (HCC) 10/25/2014   Hypokalemia 06/04/2016   Last Assessment & Plan:  Replace with potassium chloride .   IBS (irritable bowel syndrome)    ICD (implantable cardioverter-defibrillator) in place 10/25/2014   Overview:  Overview:  dual chamber ICD,upgraded to CRT with LBBB and nonischemic cardiomyopathy  Formatting of this note might be different from the original. Overview:  dual chamber ICD,upgraded to CRT with LBBB and nonischemic cardiomyopathy  Overview:  Overview:  BIV -ICD,upgraded to CRT with LBBB and nonischemic cardiomyopathy  Koleen Ny, CMA - 09/07/2016 3:00 PM EDT REMOTE MONITORING ASSE   LBBB (left bundle branch block) 12/02/2016   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) 05/21/2015   Meibomian gland dysfunction (MGD) of both eyes 05/21/2021   Migraine 12/02/2016   Mild CAD 10/25/2014   Mixed hyperlipidemia 11/10/2010   Last Assessment & Plan:  Formatting of this note might be different from the original. Continue statin therapy.   Neck mass 07/26/2022   07/26/2022: wt  loss, neck mass (B)     Nephrolithiasis 12/02/2016   Neuralgic pain 04/12/2022   04/12/2022: onset 12/2021, right T4 anterior     Onychomycosis 11/10/2010   Optic nerve disorder, bilateral 05/11/2022   Optic neuropathy 08/01/2022   08/01/2022 : [facility]     Osteoarthritis    PAD (peripheral artery disease) (HCC) 11/03/2014   Overview:  status post insertion of aortic stent and left common iliac stent by Dr. Laurence in August of 2016   Peripheral arterial occlusive disease (HCC) 11/03/2014   Precordial chest pain 06/04/2016   Last Assessment & Plan:  My suspicion is that this is likely related to gastroesophageal reflux disease with acute indigestion last evening.  The biomarker elevation is not completely unexpected in  this elderly patient with reported cardiomyopathy and current demand ventricular pacing.  Nonetheless she does have a history of extensive vascular disease (though apparently no significant coronary atherosclerosis in 2013) so we will proceed with a noninvasive risk stratification with pharmacological nuclear stress testing.  Assuming this is a low risk or normal study then I would not advise any invasive cardiac evaluation at this time.   Preoperative cardiovascular examination 04/02/2015   Primary open angle glaucoma (POAG) of both eyes, severe stage 09/24/2022   Pseudophakia of both eyes 05/21/2021   Rectal bleeding 11/19/2017   Formatting of this note might be different from the original. 2019   Renal artery stenosis (HCC) 12/02/2016   Rheumatoid arthritis involving multiple sites (HCC) 05/21/2015   Screening for breast cancer 04/02/2015   Stroke [facility])    Type 2 diabetes mellitus (HCC) 11/10/2010   Last Assessment & Plan:  Formatting of this note might be different from the original. Continue glipizide and add sliding scale insulin coverage Formatting of this note might be different from the original. Per 01/28/2019 Podiatry VN.   Type 2 diabetes mellitus with diabetic polyneuropathy, without long-term current use of insulin (HCC) 02/25/2019   Per 01/28/2019 Podiatry VN.     Unintended weight loss 07/26/2022   07/26/2022     Urge incontinence of urine 09/08/2020   Formatting of this note might be different from the original.  09/08/2020: chronic, UROL referral     Urinary tract infection without hematuria 08/18/2021   Formatting of this note might be different from the original.  08/18/2021     Vitamin D deficiency 06/20/2016    Current Medications: Current Meds  Medication Sig   acetaminophen  (TYLENOL ) 500 MG tablet Take 1,000 mg by mouth 2 (two) times daily.   albuterol (VENTOLIN HFA) 108 (90 Base) MCG/ACT inhaler 1 puff every 4 (four) hours as needed for shortness of breath.    atorvastatin  (LIPITOR) 40 MG tablet Take 1 tablet (40 mg total) by mouth daily.   brimonidine (ALPHAGAN) 0.2 % ophthalmic solution Place 1 drop into both eyes 3 (three) times daily.   Calcium  Carb-Cholecalciferol (CALCIUM  HIGH POTENCY/VITAMIN D) 600-5 MG-MCG TABS Take 1 tablet by mouth daily.   clopidogrel  (PLAVIX ) 75 MG tablet TAKE ONE TABLET BY MOUTH DAILY   Dorzolamide HCl-Timolol Mal PF 2-0.5 % SOLN Apply 1 drop to eye 2 (two) times daily.   doxycycline (MONODOX) 100 MG capsule Take 100 mg by mouth daily.   esomeprazole (NEXIUM) 40 MG packet Take 40 mg by mouth daily before breakfast.   FARXIGA 5 MG TABS tablet Take 5 mg by mouth daily.   fluconazole (DIFLUCAN) 200 MG tablet Take 200 mg by mouth 2 (two) times a week. As needed  furosemide  (LASIX ) 80 MG tablet TAKE ONE TABLET BY MOUTH DAILY   glipiZIDE (GLUCOTROL) 10 MG tablet Take 10 mg by mouth 2 (two) times daily before a meal.    isosorbide  mononitrate (IMDUR ) 60 MG 24 hr tablet Take 1 tablet (60 mg total) by mouth daily.   JANUVIA 25 MG tablet Take 25 mg by mouth daily.   ketoconazole (NIZORAL) 2 % shampoo Apply 1 application topically 2 (two) times a week.   latanoprost (XALATAN) 0.005 % ophthalmic solution Place 1 drop into both eyes at bedtime.   metoprolol  succinate (TOPROL -XL) 25 MG 24 hr tablet Take 1 tablet (25 mg total) by mouth every evening.   MIEBO 1.338 GM/ML SOLN Place 1 drop into both eyes 3 (three) times daily.   montelukast (SINGULAIR) 10 MG tablet Take by mouth.   Multiple Vitamin (MULTI-VITAMIN) tablet Take 1 tablet by mouth daily.   Multiple Vitamins-Minerals (THERA-M) TABS Take 1 tablet by mouth daily.   Multiple Vitamins-Minerals (ZINC PO) Take 1 tablet by mouth daily at 12 noon.   nitroGLYCERIN  (NITROSTAT ) 0.4 MG SL tablet Place 1 tablet (0.4 mg total) under the tongue every 5 (five) minutes as needed for chest pain.   pantoprazole (PROTONIX) 40 MG tablet Take 40 mg by mouth daily.   potassium chloride  (KLOR-CON )  10 MEQ tablet Take 1 tablet (10 mEq total) by mouth daily.   Prodigy Twist Top Lancets 28G MISC by Does not apply route.   ramipril (ALTACE) 10 MG capsule Take 10 mg by mouth daily.   ranitidine (ZANTAC) 300 MG tablet Take by mouth.   ranolazine  (RANEXA ) 500 MG 12 hr tablet Take by mouth.   RESTASIS 0.05 % ophthalmic emulsion Place 1 drop into both eyes 2 (two) times daily.   sodium chloride  (MURO 128) 5 % ophthalmic solution 1 drop 2 (two) times daily.   solifenacin (VESICARE) 10 MG tablet Take 10 mg by mouth daily.   terbinafine (LAMISIL) 250 MG tablet Take 250 mg by mouth daily as needed (Dermatitis on scalp).    tolterodine (DETROL LA) 4 MG 24 hr capsule Take 4 mg by mouth daily.   traZODone (DESYREL) 50 MG tablet Take by mouth.   triamcinolone cream (KENALOG) 0.5 % apply TWICE DAILY to itchy spots on scalp FOR 1-2 WEEKS AS NEEDED   VASCEPA 1 g CAPS Take 2 g by mouth 2 (two) times daily.    Vitamin D, Ergocalciferol, (DRISDOL) 50000 units CAPS capsule Take 50,000 Units by mouth every 14 (fourteen) days.       EKGs/Labs/Other Studies Reviewed:    The following studies were reviewed today:     He KG was not repeated EKG [facility] health show that she had normal biventricular pacemaker function  Physical Exam:    VS:  BP 118/62   Pulse 72   Ht 5' 2 (1.575 m)   Wt 110 lb 9.6 oz (50.2 kg)   SpO2 97%   BMI 20.23 kg/m     Wt Readings from Last 3 Encounters:  04/06/23 110 lb 9.6 oz (50.2 kg)  10/02/22 115 lb 12.8 oz (52.5 kg)  07/25/22 117 lb 9.6 oz (53.3 kg)     GEN:  Well nourished, well developed in no acute distress HEENT: Normal NECK: No JVD; No carotid bruits LYMPHATICS: No lymphadenopathy CARDIAC: RRR, no murmurs, rubs, gallops RESPIRATORY:  Clear to auscultation without rales, wheezing or rhonchi  ABDOMEN: Soft, non-tender, non-distended MUSCULOSKELETAL:  No edema; No deformity  SKIN: Warm and dry NEUROLOGIC:  Alert and oriented x 3 PSYCHIATRIC:  Normal affect     Signed, Redell Leiter, MD  04/06/2023 2:26 PM    [facility]

## 2023-04-05 ENCOUNTER — Encounter: Payer: Self-pay | Admitting: Cardiology

## 2023-04-06 ENCOUNTER — Ambulatory Visit: Payer: Medicare PPO | Attending: Cardiology | Admitting: Cardiology

## 2023-04-06 ENCOUNTER — Encounter: Payer: Self-pay | Admitting: Cardiology

## 2023-04-06 VITALS — BP 118/62 | HR 72 | Ht 62.0 in | Wt 110.6 lb

## 2023-04-06 DIAGNOSIS — E785 Hyperlipidemia, unspecified: Secondary | ICD-10-CM

## 2023-04-06 DIAGNOSIS — I2510 Atherosclerotic heart disease of native coronary artery without angina pectoris: Secondary | ICD-10-CM

## 2023-04-06 DIAGNOSIS — R079 Chest pain, unspecified: Secondary | ICD-10-CM

## 2023-04-06 DIAGNOSIS — I130 Hypertensive heart and chronic kidney disease with heart failure and stage 1 through stage 4 chronic kidney disease, or unspecified chronic kidney disease: Secondary | ICD-10-CM | POA: Diagnosis not present

## 2023-04-06 DIAGNOSIS — Z95810 Presence of automatic (implantable) cardiac defibrillator: Secondary | ICD-10-CM | POA: Diagnosis not present

## 2023-04-06 DIAGNOSIS — N183 Chronic kidney disease, stage 3 unspecified: Secondary | ICD-10-CM

## 2023-04-06 NOTE — Patient Instructions (Signed)
 Medication Instructions:  Your physician recommends that you continue on your current medications as directed. Please refer to the Current Medication list given to you today.   *If you need a refill on your cardiac medications before your next appointment, please call your pharmacy*   Lab Work: None ordered If you have labs (blood work) drawn today and your tests are completely normal, you will receive your results only by: MyChart Message (if you have MyChart) OR A paper copy in the mail If you have any lab test that is abnormal or we need to change your treatment, we will call you to review the results.   Testing/Procedures:   [facility] Cardiovascular Imaging at [facility] [address] [place], [place] [address] Phone: [phone]   Please arrive 15 minutes prior to your appointment time for registration and insurance purposes.  The test will take approximately 3 to 4 hours to complete; you may bring reading material.  If someone comes with you to your appointment, they will need to remain in the main lobby due to limited space in the testing area. **If you are pregnant or breastfeeding, please notify the nuclear lab prior to your appointment**  How to prepare for your Myocardial Perfusion Test: Do not eat or drink 3 hours prior to your test, except you may have water. Do not consume products containing caffeine (regular or decaffeinated) 12 hours prior to your test. (ex: coffee, chocolate, sodas, tea). Do bring a list of your current medications with you.  If not listed below, you may take your medications as normal. Do not take metoprolol  (Lopressor , Toprol ) for 24 hours prior to the test.  Bring the medication to your appointment as you may be required to take it once the test is complete. Do wear comfortable clothes (no dresses or overalls) and walking shoes, tennis shoes preferred (No heels or open toe shoes are allowed). Do NOT wear cologne, perfume, aftershave, or lotions  (deodorant is allowed). If these instructions are not followed, your test will have to be rescheduled.  If you cannot keep your appointment, please provide 24 hours notification to the Nuclear Lab, to avoid a possible $50 charge to your account.     Your physician has requested that you have an echocardiogram. Echocardiography is a painless test that uses sound waves to create images of your heart. It provides your doctor with information about the size and shape of your heart and how well your heart's chambers and valves are working. This procedure takes approximately one hour. There are no restrictions for this procedure. Please do NOT wear cologne, perfume, aftershave, or lotions (deodorant is allowed). Please arrive 15 minutes prior to your appointment time.  Please note: We ask at that you not bring children with you during ultrasound (echo/ vascular) testing. Due to room size and safety concerns, children are not allowed in the ultrasound rooms during exams. Our front office staff cannot provide observation of children in our lobby area while testing is being conducted. An adult accompanying a patient to their appointment will only be allowed in the ultrasound room at the discretion of the ultrasound technician under special circumstances. We apologize for any inconvenience.    Follow-Up: At [facility], you and your health needs are our priority.  As part of our continuing mission to provide you with exceptional heart care, we have created designated Provider Care Teams.  These Care Teams include your primary Cardiologist (physician) and Advanced Practice Providers (APPs -  Physician Assistants  and Nurse Practitioners) who all work together to provide you with the care you need, when you need it.  We recommend signing up for the patient portal called MyChart.  Sign up information is provided on this After Visit Summary.  MyChart is used to connect with patients for Virtual Visits  (Telemedicine).  Patients are able to view lab/test results, encounter notes, upcoming appointments, etc.  Non-urgent messages can be sent to your provider as well.   To learn more about what you can do with MyChart, go to [email or web address].    Your next appointment:   3 month(s)  Provider:   Redell Leiter, MD   Other Instructions  Cardiac Nuclear Scan A cardiac nuclear scan is a test that is done to check the flow of blood to your heart. It is done when you are resting and when you are exercising. The test looks for problems such as: Not enough blood reaching a portion of the heart. The heart muscle not working as it should. You may need this test if you have: Heart disease. Lab results that are not normal. Had heart surgery or a balloon procedure to open up blocked arteries (angioplasty) or a small mesh tube (stent). Chest pain. Shortness of breath. Had a heart attack. In this test, a special dye (tracer) is put into your bloodstream. The tracer will travel to your heart. A camera will then take pictures of your heart to see how the tracer moves through your heart. This test is usually done at a hospital and takes 2-4 hours. Tell a doctor about: Any allergies you have. All medicines you are taking, including vitamins, herbs, eye drops, creams, and over-the-counter medicines. Any bleeding problems you have. Any surgeries you have had. Any medical conditions you have. Whether you are pregnant or may be pregnant. Any history of asthma or long-term (chronic) lung disease. Any history of heart rhythm disorders or heart valve conditions. What are the risks? Your doctor will talk with you about risks. These may include: Serious chest pain and heart attack. This is only a risk if the stress portion of the test is done. Fast or uneven heartbeats (palpitations). A feeling of warmth in your chest. This feeling usually does not last long. Allergic reaction to the tracer. Shortness of  breath or trouble breathing. What happens before the test? Ask your doctor about changing or stopping your normal medicines. Follow instructions from your doctor about what you cannot eat or drink. Remove your jewelry on the day of the test. Ask your doctor if you need to avoid nicotine or caffeine. What happens during the test? An IV tube will be inserted into one of your veins. Your doctor will give you a small amount of tracer through the IV tube. You will wait for 20-40 minutes while the tracer moves through your bloodstream. Your heart will be monitored with an electrocardiogram (ECG). You will lie down on an exam table. Pictures of your heart will be taken for about 15-20 minutes. You may also have a stress test. For this test, one of these things may be done: You will be asked to exercise on a treadmill or a stationary bike. You will be given medicines that will make your heart work harder. This is done if you are unable to exercise. When blood flow to your heart has peaked, a tracer will again be given through the IV tube. After 20-40 minutes, you will get back on the exam table. More pictures will be taken of  your heart. Depending on the tracer that is used, more pictures may need to be taken 3-4 hours later. Your IV tube will be removed when the test is over. The test may vary among doctors and hospitals. What happens after the test? Ask your doctor: Whether you can return to your normal schedule, including diet, activities, travel, and medicines. Whether you should drink more fluids. This will help to remove the tracer from your body. Ask your doctor, or the department that is doing the test: When will my results be ready? How will I get my results? What are my treatment options? What other tests do I need? What are my next steps? This information is not intended to replace advice given to you by your health care provider. Make sure you discuss any questions you have with  your health care provider. Document Revised: 08/16/2021 Document Reviewed: 08/16/2021 Elsevier Patient Education  2023 Elsevier Inc.  Echocardiogram An echocardiogram is a test that uses sound waves (ultrasound) to produce images of the heart. Images from an echocardiogram can provide important information about: Heart size and shape. The size and thickness and movement of your heart's walls. Heart muscle function and strength. Heart valve function or if you have stenosis. Stenosis is when the heart valves are too narrow. If blood is flowing backward through the heart valves (regurgitation). A tumor or infectious growth around the heart valves. Areas of heart muscle that are not working well because of poor blood flow or injury from a heart attack. Aneurysm detection. An aneurysm is a weak or damaged part of an artery wall. The wall bulges out from the normal force of blood pumping through the body. Tell a health care provider about: Any allergies you have. All medicines you are taking, including vitamins, herbs, eye drops, creams, and over-the-counter medicines. Any blood disorders you have. Any surgeries you have had. Any medical conditions you have. Whether you are pregnant or may be pregnant. What are the risks? Generally, this is a safe test. However, problems may occur, including an allergic reaction to dye (contrast) that may be used during the test. What happens before the test? No specific preparation is needed. You may eat and drink normally. What happens during the test?  You will take off your clothes from the waist up and put on a hospital gown. Electrodes or electrocardiogram (ECG)patches may be placed on your chest. The electrodes or patches are then connected to a device that monitors your heart rate and rhythm. You will lie down on a table for an ultrasound exam. A gel will be applied to your chest to help sound waves pass through your skin. A handheld device, called  a transducer, will be pressed against your chest and moved over your heart. The transducer produces sound waves that travel to your heart and bounce back (or echo back) to the transducer. These sound waves will be captured in real-time and changed into images of your heart that can be viewed on a video monitor. The images will be recorded on a computer and reviewed by your health care provider. You may be asked to change positions or hold your breath for a short time. This makes it easier to get different views or better views of your heart. In some cases, you may receive contrast through an IV in one of your veins. This can improve the quality of the pictures from your heart. The procedure may vary among health care providers and hospitals. What can I expect after  the test? You may return to your normal, everyday life, including diet, activities, and medicines, unless your health care provider tells you not to do that. Follow these instructions at home: It is up to you to get the results of your test. Ask your health care provider, or the department that is doing the test, when your results will be ready. Keep all follow-up visits. This is important. Summary An echocardiogram is a test that uses sound waves (ultrasound) to produce images of the heart. Images from an echocardiogram can provide important information about the size and shape of your heart, heart muscle function, heart valve function, and other possible heart problems. You do not need to do anything to prepare before this test. You may eat and drink normally. After the echocardiogram is completed, you may return to your normal, everyday life, unless your health care provider tells you not to do that. This information is not intended to replace advice given to you by your health care provider. Make sure you discuss any questions you have with your health care provider. Document Revised: 12/01/2020 Document Reviewed: 11/11/2019 Elsevier  Patient Education  2023 Elsevier Inc.    Important Information About Sugar

## 2023-04-18 ENCOUNTER — Ambulatory Visit: Payer: Medicare PPO | Admitting: Cardiology

## 2023-04-19 ENCOUNTER — Telehealth (HOSPITAL_COMMUNITY): Payer: Self-pay | Admitting: *Deleted

## 2023-04-19 NOTE — Telephone Encounter (Signed)
Spoke with patient and she was given detailed instructions about her STRESS TEST on 04/26/23 at 11:00.

## 2023-04-26 ENCOUNTER — Ambulatory Visit: Payer: Medicare PPO | Attending: Cardiology

## 2023-04-26 ENCOUNTER — Ambulatory Visit: Payer: Medicare PPO

## 2023-04-26 DIAGNOSIS — R079 Chest pain, unspecified: Secondary | ICD-10-CM | POA: Diagnosis not present

## 2023-04-26 DIAGNOSIS — I2510 Atherosclerotic heart disease of native coronary artery without angina pectoris: Secondary | ICD-10-CM

## 2023-04-26 LAB — ECHOCARDIOGRAM COMPLETE
Area-P 1/2: 2.89 cm2
Height: 62 in
P 1/2 time: 642 ms
S' Lateral: 2.2 cm
Weight: 1760 [oz_av]

## 2023-04-26 MED ORDER — REGADENOSON 0.4 MG/5ML IV SOLN
0.4000 mg | Freq: Once | INTRAVENOUS | Status: AC
  Administered 2023-04-26: 0.4 mg via INTRAVENOUS

## 2023-04-26 MED ORDER — TECHNETIUM TC 99M TETROFOSMIN IV KIT
10.8000 | PACK | Freq: Once | INTRAVENOUS | Status: AC | PRN
  Administered 2023-04-26: 10.8 via INTRAVENOUS

## 2023-04-26 MED ORDER — TECHNETIUM TC 99M TETROFOSMIN IV KIT
32.8000 | PACK | Freq: Once | INTRAVENOUS | Status: AC | PRN
Start: 2023-04-26 — End: 2023-04-26
  Administered 2023-04-26: 32.8 via INTRAVENOUS

## 2023-04-27 LAB — MYOCARDIAL PERFUSION IMAGING
LV dias vol: 48 mL (ref 46–106)
LV sys vol: 13 mL
Nuc Stress EF: 73 %
Peak HR: 83 {beats}/min
Rest HR: 70 {beats}/min
Rest Nuclear Isotope Dose: 10.8 mCi
SDS: 1
SRS: 0
SSS: 1
ST Depression (mm): 0 mm
Stress Nuclear Isotope Dose: 32.8 mCi
TID: 0.96

## 2023-04-27 NOTE — Telephone Encounter (Signed)
Patient returned staff call regarding her test results.

## 2023-04-27 NOTE — Telephone Encounter (Addendum)
Patient notified of Echo results and verbalized understanding . She is aware STRESS test results are pending.

## 2023-04-30 NOTE — Progress Notes (Signed)
Remote ICD transmission.

## 2023-06-04 ENCOUNTER — Other Ambulatory Visit: Payer: Self-pay | Admitting: Cardiology

## 2023-06-04 DIAGNOSIS — I739 Peripheral vascular disease, unspecified: Secondary | ICD-10-CM

## 2023-06-20 ENCOUNTER — Ambulatory Visit (INDEPENDENT_AMBULATORY_CARE_PROVIDER_SITE_OTHER): Payer: Medicare PPO

## 2023-06-20 DIAGNOSIS — I428 Other cardiomyopathies: Secondary | ICD-10-CM

## 2023-06-21 LAB — CUP PACEART REMOTE DEVICE CHECK
Battery Remaining Longevity: 18 mo
Battery Remaining Percentage: 32 %
Battery Voltage: 2.86 V
Brady Statistic AP VP Percent: 1 %
Brady Statistic AP VS Percent: 1 %
Brady Statistic AS VP Percent: 99 %
Brady Statistic AS VS Percent: 1 %
Brady Statistic RA Percent Paced: 1 %
Date Time Interrogation Session: 20250319030105
HighPow Impedance: 54 Ohm
Implantable Lead Connection Status: 753985
Implantable Lead Connection Status: 753985
Implantable Lead Connection Status: 753985
Implantable Lead Implant Date: 20140702
Implantable Lead Implant Date: 20140702
Implantable Lead Implant Date: 20150730
Implantable Lead Location: 753858
Implantable Lead Location: 753859
Implantable Lead Location: 753860
Implantable Lead Model: 7122
Implantable Pulse Generator Implant Date: 20210322
Lead Channel Impedance Value: 340 Ohm
Lead Channel Impedance Value: 400 Ohm
Lead Channel Impedance Value: 450 Ohm
Lead Channel Pacing Threshold Amplitude: 0.75 V
Lead Channel Pacing Threshold Amplitude: 1 V
Lead Channel Pacing Threshold Amplitude: 2.5 V
Lead Channel Pacing Threshold Pulse Width: 0.4 ms
Lead Channel Pacing Threshold Pulse Width: 0.5 ms
Lead Channel Pacing Threshold Pulse Width: 1.2 ms
Lead Channel Sensing Intrinsic Amplitude: 1.8 mV
Lead Channel Sensing Intrinsic Amplitude: 11.7 mV
Lead Channel Setting Pacing Amplitude: 2 V
Lead Channel Setting Pacing Amplitude: 2.5 V
Lead Channel Setting Pacing Amplitude: 3.25 V
Lead Channel Setting Pacing Pulse Width: 0.4 ms
Lead Channel Setting Pacing Pulse Width: 1.2 ms
Lead Channel Setting Sensing Sensitivity: 0.5 mV
Pulse Gen Serial Number: 810001491
Zone Setting Status: 755011

## 2023-06-22 ENCOUNTER — Ambulatory Visit: Payer: Medicare PPO | Admitting: Podiatry

## 2023-06-22 ENCOUNTER — Encounter: Payer: Self-pay | Admitting: Podiatry

## 2023-06-22 DIAGNOSIS — B351 Tinea unguium: Secondary | ICD-10-CM

## 2023-06-22 DIAGNOSIS — M79675 Pain in left toe(s): Secondary | ICD-10-CM | POA: Diagnosis not present

## 2023-06-22 DIAGNOSIS — M79674 Pain in right toe(s): Secondary | ICD-10-CM

## 2023-06-22 NOTE — Progress Notes (Signed)
 Subjective:  Patient ID: Shari [name], female    DOB: 10-Feb-1935,  MRN: [id number]  Shari [name] presents to clinic today for:  Chief Complaint  Patient presents with   [facility]    [facility] today last A1c was in Feb it was 6.9 and she takes plavix   Patient notes nails are thick and elongated, causing pain in shoe gear when ambulating.  She notes arthritis pain in her feet whenever she first gets up to walk around.  Improves after 5-15 minutes.  Takes 2 Tylenol arthritis pills every morning.  She's had previous bunion correction on left foot, bilateral hallux nail avulsions previously, and right neuroma surgery by Dr. Celene [name].   PCP is Shari Bass, MD.  Past Medical History:  Diagnosis Date   After cataract not obscuring vision, bilateral 05/21/2021   Anxiety    Atypical chest pain 08/24/2021   Automatic implantable cardioverter-defibrillator in situ 10/25/2014   Overview:  Overview:  dual chamber ICD,upgraded to CRT with LBBB and nonischemic cardiomyopathy   Biventricular ICD (implantable cardioverter-defibrillator) in place 10/25/2014   Overview:  BIV -ICD,upgraded to CRT with LBBB and nonischemic cardiomyopathy   Chronic combined systolic and diastolic heart failure (HCC) 10/25/2014   Overview:  EF 77% by MUGA 06/15/15  Formatting of this note might be different from the original. Overview:  EF 66% by MUGA 03/12/14  Overview:  Overview:  EF 77% by MUGA 06/15/15  Overview:  EF 66% by MUGA 03/12/14  Overview:  Overview:  EF 77% by MUGA 06/15/15 Formatting of this note might be different from the original. EF 66% by MUGA 03/12/14   Chronic coronary artery disease 10/25/2014   Chronic kidney disease, stage 3 (HCC) 12/05/2016   Combined systolic and diastolic congestive heart failure (HCC) 10/25/2014   Last Assessment & Plan:  Formatting of this note might be different from the original. Type and degree unknown though patient currently appears to be euvolemic.   Community acquired  pneumonia of right lung 07/17/2022   07/17/2022     Coronary artery disease 10/25/2014   Costochondritis 10/25/2014   COVID-19 12/20/2020   Diabetes mellitus 2 years   Diverticulitis    Dizziness 06/30/2018   Last Assessment & Plan:  Formatting of this note might be different from the original. 89 yo F with history of CHF, DM, PVD, h/o TIA with double vision in 2013, HTN, HLD.   Yesterday morning she reports that when she woke up and looked over at her clock it looked blurry.  She denies double vision, facial droop or extremity weakness.  When she tried to get up to ambulate she reports that she felt d   Dry eye syndrome of both lacrimal glands 05/21/2021   Dysuria 06/15/2017   Elevated troponin 06/04/2016   Last Assessment & Plan:  Likely may be related to ventricular pacing and underlying cardiomyopathy.  The trajectory of the biomarker elevation and neither is her clinical history suggestive of acute coronary syndrome.  Further stratification with nuclear stress testing   Essential hypertension 06/04/2016   Last Assessment & Plan:  Formatting of this note might be different from the original. Continue carvedilol   Fuchs' corneal dystrophy of both eyes 05/21/2021   Gastroesophageal reflux disease 11/10/2010   Last Assessment & Plan:  Formatting of this note might be different from the original. Continue Dexilant.   GERD (gastroesophageal reflux disease)    Giant cell arteritis (HCC) 05/15/2022   05/15/2022: see eye  doctor concerns     Glaucoma    Glaucoma 11/10/2010   IMO SNOMED Dx Update Oct 2024     Gout 12/02/2016   High cholesterol    History of ischemic stroke 06/30/2018   Hordeolum externum of right upper eyelid 04/12/2022   04/12/2022     Hypertension    Hypertensive heart and kidney disease with heart failure and with chronic kidney disease stage III (HCC) 11/10/2010   Hypertensive heart disease with heart failure (HCC) 10/25/2014   Hypokalemia 06/04/2016   Last Assessment &  Plan:  Replace with potassium chloride.   IBS (irritable bowel syndrome)    ICD (implantable cardioverter-defibrillator) in place 10/25/2014   Overview:  Overview:  dual chamber ICD,upgraded to CRT with LBBB and nonischemic cardiomyopathy  Formatting of this note might be different from the original. Overview:  dual chamber ICD,upgraded to CRT with LBBB and nonischemic cardiomyopathy  Overview:  Overview:  BIV -ICD,upgraded to CRT with LBBB and nonischemic cardiomyopathy  Shari [name], CMA - 09/07/2016 3:00 PM EDT REMOTE MONITORING ASSE   LBBB (left bundle branch block) 12/02/2016   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) 05/21/2015   Meibomian gland dysfunction (MGD) of both eyes 05/21/2021   Migraine 12/02/2016   Mild CAD 10/25/2014   Mixed hyperlipidemia 11/10/2010   Last Assessment & Plan:  Formatting of this note might be different from the original. Continue statin therapy.   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 Imogene Burn in August of 2016   Peripheral arterial occlusive disease (HCC) 11/03/2014   Precordial chest pain 06/04/2016   Last Assessment & Plan:  My suspicion is that this is likely related to gastroesophageal reflux disease with acute indigestion last evening.  The biomarker elevation is not completely unexpected in this elderly patient with reported cardiomyopathy and current demand ventricular pacing.  Nonetheless she does have a history of extensive vascular disease (though apparently no significant coronary atherosclerosis in 2013) so we will proceed with a  noninvasive risk stratification with pharmacological nuclear stress testing.  Assuming this is a low risk or normal study then I would not advise any invasive cardiac evaluation at this time.   Preoperative cardiovascular examination 04/02/2015   Primary open angle glaucoma (POAG) of both eyes, severe stage 09/24/2022   Pseudophakia of both eyes 05/21/2021   Rectal bleeding 11/19/2017   Formatting of this note might be different from the original. 2019   Renal artery stenosis (HCC) 12/02/2016   Rheumatoid arthritis involving multiple sites (HCC) 05/21/2015   Screening for breast cancer 04/02/2015   Stroke [facility])    Type 2 diabetes mellitus (HCC) 11/10/2010   Last Assessment & Plan:  Formatting of this note might be different from the original. Continue glipizide and add sliding scale insulin coverage Formatting of this note might be different from the original. Per 01/28/2019 Podiatry VN.   Type 2 diabetes mellitus with diabetic polyneuropathy, without long-term current use of insulin (HCC) 02/25/2019   Per 01/28/2019 Podiatry VN.     Unintended weight loss 07/26/2022  07/26/2022     Urge incontinence of urine 09/08/2020   Formatting of this note might be different from the original.  09/08/2020: chronic, UROL referral     Urinary tract infection without hematuria 08/18/2021   Formatting of this note might be different from the original.  08/18/2021     Vitamin D deficiency 06/20/2016    Allergies  Allergen Reactions   Doxycycline Nausea Only    Other Reaction(s): GI Intolerance   Metformin Nausea Only and Other (See Comments)    Other Reaction(s): GI Intolerance   Codeine Diarrhea, Nausea And Vomiting, Other (See Comments) and Hives    All-over body aches, too Other reaction(s): GI intolerance   Dulaglutide Other (See Comments)    Chest pain, N/V  Other Reaction(s): GI Intolerance   Ivp Dye [Iodinated Contrast Media] Nausea And Vomiting and Other (See Comments)   Metrizamide Nausea  And Vomiting and Other (See Comments)   Pioglitazone Other (See Comments)    Has CHF    Objective:  Cambrey S Schul is a pleasant 89 y.o. female in NAD. AAO x 3.  Vascular Examination: Patient has palpable DP pulse, absent PT pulse bilateral.  Delayed capillary refill bilateral toes.  Sparse digital hair bilateral.  Proximal to distal cooling WNL bilateral.    Dermatological Examination: Interspaces are clear with no open lesions noted bilateral.  Skin is shiny and atrophic bilateral.  Nails are 3-68mm thick, with yellowish/brown discoloration, subungual debris and distal onycholysis x8.  There is pain with compression of nails x 8.    Assessment/Plan: 1. Pain due to onychomycosis of toenails of both feet     Mycotic nails x8 were sharply debrided with sterile nail nippers and power debriding burr to decrease bulk and length.  Return in about 3 months (around 09/22/2023) for [facility].   Clerance Lav, DPM, FACFAS [facility]     [address] [place], [place] [address]                Office [phone]  Fax [phone]

## 2023-06-25 ENCOUNTER — Other Ambulatory Visit: Payer: Self-pay

## 2023-06-25 MED ORDER — FUROSEMIDE 80 MG PO TABS: ORAL_TABLET | ORAL | 3 refills | Status: AC

## 2023-07-05 ENCOUNTER — Ambulatory Visit: Payer: Medicare PPO | Admitting: Cardiology

## 2023-07-07 NOTE — Progress Notes (Deleted)
 Cardiology Office Note:  .   Date:  07/07/2023  ID:  Shari [name], DOB June 27, 1934, MRN [id number] PCP: Madlyn Schirmer, MD  [facility] Providers Cardiologist:  Zoe Hinds, MD Electrophysiologist:  Will Cortland Ding, MD { Click to update primary MD,subspecialty MD or APP then REFRESH:1}   History of Present Illness: .   Shari [name] is a 89 y.o. female with a past medical history of heart failure with presence of biventricular ICD, nonobstructive CAD, carotid artery stenosis, hypertension, giant cell arteritis, CKD, LBBB, PAD, renal artery stenosis, history of stroke, GERD, DM2, dyslipidemia.  06/22/2023 device check no noted arrhythmias 04/23/2023 Lexiscan  findings consistent with a small area of ischemia involving the basal portion of the anterior wall, low risk 04/26/2023 echo EF 60 to 65%, mild LVH, grade 1 DD, elevated LVEDP, mitral valve is degenerative with mild MR 06/20/2021 AAA duplex mild blockage in the left iliac artery 06/22/2020 carotid duplex right ICA mild, left ICA moderate stenosis 04/27/2020 ABIs abnormal, but stable compared to prior 2017 cardiac catheterization mild nonobstructive CAD  Most recently she was evaluated by Dr. Sandee Crook on 04/06/2023, she had had a recent visit to the emergency department for chest pain but was not felt to be ACS, she was optimized from a heart failure perspective.  Stress evaluation was arranged, revealing a small area of ischemia, but overall low risk study and she had previously had a left heart cath with some known disease.  ROS: ROS   Studies Reviewed: .        Cardiac Studies & Procedures   ______________________________________________________________________________________________   STRESS TESTS  MYOCARDIAL PERFUSION IMAGING 04/26/2023  Narrative   Findings are consistent with small area of  ischemia involving basal portion of the anterior wall. The study is low risk.   No ST deviation was noted.   Left ventricular  function is normal. Nuclear stress EF: 73%. The left ventricular ejection fraction is hyperdynamic (>65%). End diastolic cavity size is normal.   Prior study not available for comparison.   ECHOCARDIOGRAM  ECHOCARDIOGRAM COMPLETE 04/26/2023  Narrative ECHOCARDIOGRAM REPORT    Patient Name:   Shari [name] Date of Exam: 04/26/2023 Medical Rec #:  [id number]     Height:       62.0 in Accession #:    [id number]    Weight:       110.6 lb Date of Birth:  09-01-1934     BSA:          1.486 m Patient Age:    88 years      BP:           118/62 mmHg Patient Gender: F             HR:           68 bpm. Exam Location:  [place]  Procedure: 2D Echo, Cardiac Doppler, Color Doppler and Strain Analysis  Indications:    Coronary artery disease involving native coronary artery of native heart without angina pectoris [I25.10 (ICD-10-CM)]; Chest pain of uncertain etiology [R07.9 (ICD-10-CM)]  History:        Patient has prior history of Echocardiogram examinations, most recent 06/20/2021. CAD, ICD; Risk Factors:Hypertension and Dyslipidemia.  Sonographer:    Erminia Hazel RDCS Referring Phys: [id number] BRIAN J MUNLEY  IMPRESSIONS   1. Left ventricular ejection fraction, by estimation, is 60 to 65%. The left ventricle has normal function. The left ventricle has no regional wall motion abnormalities. There is mild left ventricular  hypertrophy. Left ventricular diastolic parameters are consistent with Grade I diastolic dysfunction (impaired relaxation). Elevated left ventricular end-diastolic pressure. The average left ventricular global longitudinal strain is 11.4 %. The global longitudinal strain is abnormal. 2. Right ventricular systolic function is normal. The right ventricular size is normal. There is normal pulmonary artery systolic pressure. 3. The mitral valve is degenerative. Mild mitral valve regurgitation. No evidence of mitral stenosis. 4. The aortic valve was not well visualized. Aortic  valve regurgitation is mild. No aortic stenosis is present. 5. Aortic DTA is NWV. 6. The inferior vena cava is normal in size with greater than 50% respiratory variability, suggesting right atrial pressure of 3 mmHg.  FINDINGS Left Ventricle: Left ventricular ejection fraction, by estimation, is 60 to 65%. The left ventricle has normal function. The left ventricle has no regional wall motion abnormalities. The average left ventricular global longitudinal strain is 11.4 %. The global longitudinal strain is abnormal. The left ventricular internal cavity size was normal in size. There is mild left ventricular hypertrophy. Left ventricular diastolic parameters are consistent with Grade I diastolic dysfunction (impaired relaxation). Elevated left ventricular end-diastolic pressure.  Right Ventricle: The right ventricular size is normal. No increase in right ventricular wall thickness. Right ventricular systolic function is normal. There is normal pulmonary artery systolic pressure. The tricuspid regurgitant velocity is 2.37 m/s, and with an assumed right atrial pressure of 3 mmHg, the estimated right ventricular systolic pressure is 25.5 mmHg.  Left Atrium: Left atrial size was normal in size.  Right Atrium: Right atrial size was normal in size.  Pericardium: There is no evidence of pericardial effusion. Presence of epicardial fat layer.  Mitral Valve: The mitral valve is degenerative in appearance. There is mild thickening of the anterior and posterior mitral valve leaflet(s). Mild mitral annular calcification. Mild mitral valve regurgitation. No evidence of mitral valve stenosis.  Tricuspid Valve: The tricuspid valve is normal in structure. Tricuspid valve regurgitation is mild . No evidence of tricuspid stenosis.  Aortic Valve: The aortic valve was not well visualized. Aortic valve regurgitation is mild. Aortic regurgitation PHT measures 642 msec. No aortic stenosis is present.  Pulmonic Valve:  The pulmonic valve was normal in structure. Pulmonic valve regurgitation is not visualized. No evidence of pulmonic stenosis.  Aorta: The aortic root is normal in size and structure, the aortic arch was not well visualized and DTA is NWV.  Venous: The pulmonary veins were not well visualized. The inferior vena cava is normal in size with greater than 50% respiratory variability, suggesting right atrial pressure of 3 mmHg.  IAS/Shunts: No atrial level shunt detected by color flow Doppler.   LEFT VENTRICLE PLAX 2D LVIDd:         3.20 cm   Diastology LVIDs:         2.20 cm   LV e' medial:    5.01 cm/s LV PW:         1.20 cm   LV E/e' medial:  17.0 LV IVS:        1.20 cm   LV e' lateral:   4.79 cm/s LVOT diam:     1.80 cm   LV E/e' lateral: 17.7 LV SV:         49 LV SV Index:   33        2D Longitudinal Strain LVOT Area:     2.54 cm  2D Strain GLS Avg:     11.4 %   RIGHT VENTRICLE  IVC RV Basal diam:  3.40 cm     IVC diam: 1.10 cm RV Mid diam:    2.60 cm RV S prime:     11.90 cm/s TAPSE (M-mode): 2.2 cm  LEFT ATRIUM             Index        RIGHT ATRIUM           Index LA diam:        2.80 cm 1.88 cm/m   RA Area:     14.20 cm LA Vol (A2C):   32.1 ml 21.60 ml/m  RA Volume:   32.30 ml  21.73 ml/m LA Vol (A4C):   39.0 ml 26.24 ml/m LA Biplane Vol: 39.0 ml 26.24 ml/m AORTIC VALVE LVOT Vmax:   87.50 cm/s LVOT Vmean:  59.267 cm/s LVOT VTI:    0.193 m AI PHT:      642 msec  AORTA Ao Root diam: 3.10 cm Ao Asc diam:  3.00 cm Ao Desc diam: 1.30 cm  MITRAL VALVE                TRICUSPID VALVE MV Area (PHT): 2.89 cm     TR Peak grad:   22.5 mmHg MV Decel Time: 263 msec     TR Vmax:        237.00 cm/s MV E velocity: 84.95 cm/s MV A velocity: 126.50 cm/s  SHUNTS MV E/A ratio:  0.67         Systemic VTI:  0.19 m Systemic Diam: 1.80 cm  Zoe Hinds MD Electronically signed by Zoe Hinds MD Signature Date/Time: 04/26/2023/12:39:09 PM    Final     MONITORS  CARDIAC EVENT MONITOR 10/29/2017       ______________________________________________________________________________________________      Risk Assessment/Calculations:   {Does this patient have ATRIAL FIBRILLATION?:[id number]} No BP recorded.  {Refresh Note OR Click here to enter BP  :1}***       Physical Exam:   VS:  There were no vitals taken for this visit.   Wt Readings from Last 3 Encounters:  04/26/23 110 lb (49.9 kg)  04/06/23 110 lb 9.6 oz (50.2 kg)  10/02/22 115 lb 12.8 oz (52.5 kg)    GEN: Well nourished, well developed in no acute distress NECK: No JVD; No carotid bruits CARDIAC: ***RRR, no murmurs, rubs, gallops RESPIRATORY:  Clear to auscultation without rales, wheezing or rhonchi  ABDOMEN: Soft, non-tender, non-distended EXTREMITIES:  No edema; No deformity   ASSESSMENT AND PLAN: .   CAD -      {Are you ordering a CV Procedure (e.g. stress test, cath, DCCV, TEE, etc)?   Press F2        :[id number]}  Dispo: ***  Signed, Terrance Ferretti, NP

## 2023-07-08 ENCOUNTER — Encounter: Payer: Self-pay | Admitting: Cardiology

## 2023-07-09 ENCOUNTER — Ambulatory Visit: Admitting: Cardiology

## 2023-07-09 DIAGNOSIS — E785 Hyperlipidemia, unspecified: Secondary | ICD-10-CM

## 2023-07-09 DIAGNOSIS — N183 Chronic kidney disease, stage 3 unspecified: Secondary | ICD-10-CM

## 2023-07-09 DIAGNOSIS — Z95810 Presence of automatic (implantable) cardiac defibrillator: Secondary | ICD-10-CM

## 2023-07-09 DIAGNOSIS — I2510 Atherosclerotic heart disease of native coronary artery without angina pectoris: Secondary | ICD-10-CM

## 2023-07-21 ENCOUNTER — Other Ambulatory Visit: Payer: Self-pay | Admitting: Cardiology

## 2023-07-23 NOTE — Progress Notes (Signed)
 Cardiology Office Note:  .   Date:  07/24/2023  ID:  Shari [name], DOB [birth year], MRN [id number] PCP: Madlyn Schirmer, MD  [facility] Providers Cardiologist:  Zoe Hinds, MD Electrophysiologist:  Will Cortland Ding, MD    History of Present Illness: .   Shari [name] is a 89 y.o. female with a past medical history of heart failure with presence of biventricular ICD, nonobstructive CAD, carotid artery stenosis, hypertension, giant cell arteritis, CKD, LBBB, PAD, renal artery stenosis, history of stroke, GERD, DM2, dyslipidemia.  06/22/2023 device check no noted arrhythmias 04/26/2023 echo EF 60 to 65%, mild LVH, grade 1 DD, elevated LVEDP, mitral valve is degenerative with mild MR 04/23/2023 Lexiscan  findings consistent with a small area of ischemia involving the basal portion of the anterior wall, low risk 06/20/2021 AAA duplex mild blockage in the left iliac artery 06/22/2020 carotid duplex right ICA mild, left ICA moderate stenosis 04/27/2020 ABIs abnormal, but stable compared to prior 2017 cardiac catheterization mild nonobstructive CAD  Most recently she was evaluated by Dr. Sandee Crook on 04/06/2023, she had had a recent visit to the emergency department for chest pain but was not felt to be ACS, she was optimized from a heart failure perspective.  Stress evaluation was arranged, revealing a small area of ischemia, but overall low risk study and she had previously had a left heart cath with some known disease.  She presents today for follow-up.  She is doing well from a cardiac perspective, she offers no formal complaints.  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  She has also been steadily losing weight.  We reviewed her weight log and it appears over the last 2 years she is down approximately 15 to 20 pounds.  We discussed this could be attributed to the high burden of stress which might be further  exacerbating her IBS.  She does have a plan to see her primary care physician in the next few weeks and will discuss this with him at that time to see if she needs to follow-up with a GI specialist.  She has not noticed any overt changes with her bowel pattern.  She has some shortness of breath, this is overall unchanged and currently at her baseline.  She denies chest pain, palpitations,  pnd, orthopnea, n, v, dizziness, syncope, edema, weight gain, or early satiety.   ROS: Review of Systems  Constitutional:  Positive for weight loss.  Respiratory:  Positive for shortness of breath.   All other systems reviewed and are negative.    Studies Reviewed: .        Cardiac Studies & Procedures   ______________________________________________________________________________________________   STRESS TESTS  MYOCARDIAL PERFUSION IMAGING 04/26/2023  Narrative   Findings are consistent with small area of  ischemia involving basal portion of the anterior wall. The study is low risk.   No ST deviation was noted.   Left ventricular function is normal. Nuclear stress EF: 73%. The left ventricular ejection fraction is hyperdynamic (>65%). End diastolic cavity size is normal.   Prior study not available for comparison.   ECHOCARDIOGRAM  ECHOCARDIOGRAM COMPLETE 04/26/2023  Narrative ECHOCARDIOGRAM REPORT    Patient Name:   Shari [name] Date of Exam: 04/26/2023 Medical Rec #:  [id number]     Height:       62.0 in Accession #:    [id number]    Weight:  110.6 lb Date of Birth:  05-May-1934     BSA:          1.486 m Patient Age:    88 years      BP:           118/62 mmHg Patient Gender: F             HR:           68 bpm. Exam Location:  [place]  Procedure: 2D Echo, Cardiac Doppler, Color Doppler and Strain Analysis  Indications:    Coronary artery disease involving native coronary artery of native heart without angina pectoris [I25.10 (ICD-10-CM)]; Chest pain of uncertain etiology [R07.9  (ICD-10-CM)]  History:        Patient has prior history of Echocardiogram examinations, most recent 06/20/2021. CAD, ICD; Risk Factors:Hypertension and Dyslipidemia.  Sonographer:    Erminia Hazel RDCS Referring Phys: [id number] BRIAN J MUNLEY  IMPRESSIONS   1. Left ventricular ejection fraction, by estimation, is 60 to 65%. The left ventricle has normal function. The left ventricle has no regional wall motion abnormalities. There is mild left ventricular hypertrophy. Left ventricular diastolic parameters are consistent with Grade I diastolic dysfunction (impaired relaxation). Elevated left ventricular end-diastolic pressure. The average left ventricular global longitudinal strain is 11.4 %. The global longitudinal strain is abnormal. 2. Right ventricular systolic function is normal. The right ventricular size is normal. There is normal pulmonary artery systolic pressure. 3. The mitral valve is degenerative. Mild mitral valve regurgitation. No evidence of mitral stenosis. 4. The aortic valve was not well visualized. Aortic valve regurgitation is mild. No aortic stenosis is present. 5. Aortic DTA is NWV. 6. The inferior vena cava is normal in size with greater than 50% respiratory variability, suggesting right atrial pressure of 3 mmHg.  FINDINGS Left Ventricle: Left ventricular ejection fraction, by estimation, is 60 to 65%. The left ventricle has normal function. The left ventricle has no regional wall motion abnormalities. The average left ventricular global longitudinal strain is 11.4 %. The global longitudinal strain is abnormal. The left ventricular internal cavity size was normal in size. There is mild left ventricular hypertrophy. Left ventricular diastolic parameters are consistent with Grade I diastolic dysfunction (impaired relaxation). Elevated left ventricular end-diastolic pressure.  Right Ventricle: The right ventricular size is normal. No increase in right ventricular wall  thickness. Right ventricular systolic function is normal. There is normal pulmonary artery systolic pressure. The tricuspid regurgitant velocity is 2.37 m/s, and with an assumed right atrial pressure of 3 mmHg, the estimated right ventricular systolic pressure is 25.5 mmHg.  Left Atrium: Left atrial size was normal in size.  Right Atrium: Right atrial size was normal in size.  Pericardium: There is no evidence of pericardial effusion. Presence of epicardial fat layer.  Mitral Valve: The mitral valve is degenerative in appearance. There is mild thickening of the anterior and posterior mitral valve leaflet(s). Mild mitral annular calcification. Mild mitral valve regurgitation. No evidence of mitral valve stenosis.  Tricuspid Valve: The tricuspid valve is normal in structure. Tricuspid valve regurgitation is mild . No evidence of tricuspid stenosis.  Aortic Valve: The aortic valve was not well visualized. Aortic valve regurgitation is mild. Aortic regurgitation PHT measures 642 msec. No aortic stenosis is present.  Pulmonic Valve: The pulmonic valve was normal in structure. Pulmonic valve regurgitation is not visualized. No evidence of pulmonic stenosis.  Aorta: The aortic root is normal in size and structure, the aortic arch was not well visualized and  DTA is NWV.  Venous: The pulmonary veins were not well visualized. The inferior vena cava is normal in size with greater than 50% respiratory variability, suggesting right atrial pressure of 3 mmHg.  IAS/Shunts: No atrial level shunt detected by color flow Doppler.   LEFT VENTRICLE PLAX 2D LVIDd:         3.20 cm   Diastology LVIDs:         2.20 cm   LV e' medial:    5.01 cm/s LV PW:         1.20 cm   LV E/e' medial:  17.0 LV IVS:        1.20 cm   LV e' lateral:   4.79 cm/s LVOT diam:     1.80 cm   LV E/e' lateral: 17.7 LV SV:         49 LV SV Index:   33        2D Longitudinal Strain LVOT Area:     2.54 cm  2D Strain GLS Avg:     11.4  %   RIGHT VENTRICLE             IVC RV Basal diam:  3.40 cm     IVC diam: 1.10 cm RV Mid diam:    2.60 cm RV S prime:     11.90 cm/s TAPSE (M-mode): 2.2 cm  LEFT ATRIUM             Index        RIGHT ATRIUM           Index LA diam:        2.80 cm 1.88 cm/m   RA Area:     14.20 cm LA Vol (A2C):   32.1 ml 21.60 ml/m  RA Volume:   32.30 ml  21.73 ml/m LA Vol (A4C):   39.0 ml 26.24 ml/m LA Biplane Vol: 39.0 ml 26.24 ml/m AORTIC VALVE LVOT Vmax:   87.50 cm/s LVOT Vmean:  59.267 cm/s LVOT VTI:    0.193 m AI PHT:      642 msec  AORTA Ao Root diam: 3.10 cm Ao Asc diam:  3.00 cm Ao Desc diam: 1.30 cm  MITRAL VALVE                TRICUSPID VALVE MV Area (PHT): 2.89 cm     TR Peak grad:   22.5 mmHg MV Decel Time: 263 msec     TR Vmax:        237.00 cm/s MV E velocity: 84.95 cm/s MV A velocity: 126.50 cm/s  SHUNTS MV E/A ratio:  0.67         Systemic VTI:  0.19 m Systemic Diam: 1.80 cm  Zoe Hinds MD Electronically signed by Zoe Hinds MD Signature Date/Time: 04/26/2023/12:39:09 PM    Final    MONITORS  CARDIAC EVENT MONITOR 10/29/2017       ______________________________________________________________________________________________      Risk Assessment/Calculations:             Physical Exam:   VS:  BP (!) 100/58   Pulse 68   Ht 5\' 2"  (1.575 m)   Wt 110 lb (49.9 kg)   SpO2 95%   BMI 20.12 kg/m    Wt Readings from Last 3 Encounters:  07/24/23 110 lb (49.9 kg)  04/26/23 110 lb (49.9 kg)  04/06/23 110 lb 9.6 oz (50.2 kg)    GEN: Well nourished, well developed in no acute distress NECK: No  JVD; No carotid bruits CARDIAC: RRR, no murmurs, rubs, gallops RESPIRATORY:  Clear to auscultation without rales, wheezing or rhonchi  ABDOMEN: Soft, non-tender, non-distended EXTREMITIES:  No edema; No deformity   ASSESSMENT AND PLAN: .   CAD -nonobstructive per left heart cath in 2017 >> Lexiscan  earlier this year revealed small area of ischemia but low risk  study.  Continue Lipitor 40 mg daily, continue Plavix  75 mg daily, continue Imdur  60 mg daily, continue metoprolol  25 milligrams daily, continue nitroglycerin  as needed.  Heart failure-NYHA class I-II, euvolemic, CRT-D in place and follows with EP.  She is optimized regarding her heart failure.  Continue Farxiga, continue Lasix  80 mg daily, continue ramipril 10 mg daily, continue metoprolol  25 mg daily.   Dyslipidemia-monitored by PCP.  Weight loss-this has been bothersome for her, her weight appears to be unchanged since her last office visit in January however she has lost approximately 15 pounds over the last 2 years.  She is concerned about colon cancer--she has not had any overt changes with her bowel movements, hematochezia.  Overall, she feels that she has just been eating smaller meals.  She will follow-up with her PCP in a few weeks and plans to discuss with him at that time.          Dispo: 6 months.   Signed, Terrance Ferretti, NP

## 2023-07-23 NOTE — Telephone Encounter (Signed)
 Rx refill sent to pharmacy.

## 2023-07-24 ENCOUNTER — Encounter: Payer: Self-pay | Admitting: Cardiology

## 2023-07-24 ENCOUNTER — Ambulatory Visit: Attending: Cardiology | Admitting: Cardiology

## 2023-07-24 VITALS — BP 100/58 | HR 68 | Ht 62.0 in | Wt 110.0 lb

## 2023-07-24 DIAGNOSIS — I10 Essential (primary) hypertension: Secondary | ICD-10-CM

## 2023-07-24 DIAGNOSIS — E782 Mixed hyperlipidemia: Secondary | ICD-10-CM

## 2023-07-24 DIAGNOSIS — Z95810 Presence of automatic (implantable) cardiac defibrillator: Secondary | ICD-10-CM

## 2023-07-24 DIAGNOSIS — I428 Other cardiomyopathies: Secondary | ICD-10-CM | POA: Diagnosis not present

## 2023-07-24 DIAGNOSIS — I2510 Atherosclerotic heart disease of native coronary artery without angina pectoris: Secondary | ICD-10-CM | POA: Diagnosis not present

## 2023-07-24 DIAGNOSIS — R634 Abnormal weight loss: Secondary | ICD-10-CM

## 2023-07-24 NOTE — Patient Instructions (Signed)
 Medication Instructions:  Your physician recommends that you continue on your current medications as directed. Please refer to the Current Medication list given to you today.  *If you need a refill on your cardiac medications before your next appointment, please call your pharmacy*  Lab Work: None If you have labs (blood work) drawn today and your tests are completely normal, you will receive your results only by: MyChart Message (if you have MyChart) OR A paper copy in the mail If you have any lab test that is abnormal or we need to change your treatment, we will call you to review the results.  Testing/Procedures: None  Follow-Up: At [facility], you and your health needs are our priority.  As part of our continuing mission to provide you with exceptional heart care, our providers are all part of one team.  This team includes your primary Cardiologist (physician) and Advanced Practice Providers or APPs (Physician Assistants and Nurse Practitioners) who all work together to provide you with the care you need, when you need it.  Your next appointment:   76month(s)  Provider:   Norman Herrlich, MD    We recommend signing up for the patient portal called "MyChart".  Sign up information is provided on this After Visit Summary.  MyChart is used to connect with patients for Virtual Visits (Telemedicine).  Patients are able to view lab/test results, encounter notes, upcoming appointments, etc.  Non-urgent messages can be sent to your provider as well.   To learn more about what you can do with MyChart, go to [email or web address].   Other Instructions None

## 2023-08-03 NOTE — Addendum Note (Signed)
 Addended by: Lott Rouleau A on: 08/03/2023 01:24 PM   Modules accepted: Orders

## 2023-08-03 NOTE — Progress Notes (Signed)
 Remote ICD transmission.

## 2023-09-19 ENCOUNTER — Ambulatory Visit (INDEPENDENT_AMBULATORY_CARE_PROVIDER_SITE_OTHER): Payer: Medicare PPO

## 2023-09-19 DIAGNOSIS — I428 Other cardiomyopathies: Secondary | ICD-10-CM

## 2023-09-19 LAB — CUP PACEART REMOTE DEVICE CHECK
Battery Remaining Longevity: 18 mo
Battery Remaining Percentage: 31 %
Battery Voltage: 2.84 V
Brady Statistic AP VP Percent: 1 %
Brady Statistic AP VS Percent: 1 %
Brady Statistic AS VP Percent: 99 %
Brady Statistic AS VS Percent: 1 %
Brady Statistic RA Percent Paced: 1 %
Date Time Interrogation Session: 20250618020027
HighPow Impedance: 54 Ohm
Implantable Lead Connection Status: 753985
Implantable Lead Connection Status: 753985
Implantable Lead Connection Status: 753985
Implantable Lead Implant Date: 20140702
Implantable Lead Implant Date: 20140702
Implantable Lead Implant Date: 20150730
Implantable Lead Location: 753858
Implantable Lead Location: 753859
Implantable Lead Location: 753860
Implantable Lead Model: 7122
Implantable Pulse Generator Implant Date: 20210322
Lead Channel Impedance Value: 300 Ohm
Lead Channel Impedance Value: 390 Ohm
Lead Channel Impedance Value: 440 Ohm
Lead Channel Pacing Threshold Amplitude: 0.75 V
Lead Channel Pacing Threshold Amplitude: 1 V
Lead Channel Pacing Threshold Amplitude: 2.5 V
Lead Channel Pacing Threshold Pulse Width: 0.4 ms
Lead Channel Pacing Threshold Pulse Width: 0.5 ms
Lead Channel Pacing Threshold Pulse Width: 1.2 ms
Lead Channel Sensing Intrinsic Amplitude: 1.4 mV
Lead Channel Sensing Intrinsic Amplitude: 9.7 mV
Lead Channel Setting Pacing Amplitude: 2 V
Lead Channel Setting Pacing Amplitude: 2.5 V
Lead Channel Setting Pacing Amplitude: 3.25 V
Lead Channel Setting Pacing Pulse Width: 0.4 ms
Lead Channel Setting Pacing Pulse Width: 1.2 ms
Lead Channel Setting Sensing Sensitivity: 0.5 mV
Pulse Gen Serial Number: 810001491
Zone Setting Status: 755011

## 2023-09-20 ENCOUNTER — Ambulatory Visit: Payer: Self-pay | Admitting: Cardiology

## 2023-09-21 ENCOUNTER — Ambulatory Visit: Admitting: Podiatry

## 2023-09-21 DIAGNOSIS — B351 Tinea unguium: Secondary | ICD-10-CM | POA: Diagnosis not present

## 2023-09-21 DIAGNOSIS — M79674 Pain in right toe(s): Secondary | ICD-10-CM

## 2023-09-21 DIAGNOSIS — M79675 Pain in left toe(s): Secondary | ICD-10-CM

## 2023-09-21 NOTE — Progress Notes (Signed)
 Subjective:  Patient ID: Shari [name], female    DOB: 02/11/1935,  MRN: [id number]  [name] presents to clinic today for:  Chief Complaint  Patient presents with   [facility]    [facility]. Last A1c was 6.7 in March. Takes Plavix .    Patient notes nails are thick and elongated, causing pain in shoe gear when ambulating.    PCP is [name], Ardeth Beckers, MD.  Past Medical History:  Diagnosis Date   After cataract not obscuring vision, bilateral 05/21/2021   Anxiety    Atypical chest pain 08/24/2021   Automatic implantable cardioverter-defibrillator in situ 10/25/2014   Overview:  Overview:  dual chamber ICD,upgraded to CRT with LBBB and nonischemic cardiomyopathy   Biventricular ICD (implantable cardioverter-defibrillator) in place 10/25/2014   Overview:  BIV -ICD,upgraded to CRT with LBBB and nonischemic cardiomyopathy   Carotid artery stenosis    Chronic combined systolic and diastolic heart failure (HCC) 10/25/2014   Overview:  EF 77% by MUGA 06/15/15  Formatting of this note might be different from the original. Overview:  EF 66% by MUGA 03/12/14  Overview:  Overview:  EF 77% by MUGA 06/15/15  Overview:  EF 66% by MUGA 03/12/14  Overview:  Overview:  EF 77% by MUGA 06/15/15 Formatting of this note might be different from the original. EF 66% by MUGA 03/12/14   Chronic coronary artery disease 10/25/2014   Chronic kidney disease, stage 3 (HCC) 12/05/2016   Combined systolic and diastolic congestive heart failure (HCC) 10/25/2014   Last Assessment & Plan:  Formatting of this note might be different from the original. Type and degree unknown though patient currently appears to be euvolemic.   Community acquired pneumonia of right lung 07/17/2022   07/17/2022     Coronary artery disease 10/25/2014   Costochondritis 10/25/2014   COVID-19 12/20/2020   Diabetes mellitus 2 years   Diverticulitis    Dizziness 06/30/2018   Last Assessment & Plan:  Formatting of this note might  be different from the original. 89 yo F with history of CHF, DM, PVD, h/o TIA with double vision in 2013, HTN, HLD.   Yesterday morning she reports that when she woke up and looked over at her clock it looked blurry.  She denies double vision, facial droop or extremity weakness.  When she tried to get up to ambulate she reports that she felt d   Dry eye syndrome of both lacrimal glands 05/21/2021   Dysuria 06/15/2017   Elevated troponin 06/04/2016   Last Assessment & Plan:  Likely may be related to ventricular pacing and underlying cardiomyopathy.  The trajectory of the biomarker elevation and neither is her clinical history suggestive of acute coronary syndrome.  Further stratification with nuclear stress testing   Essential hypertension 06/04/2016   Last Assessment & Plan:  Formatting of this note might be different from the original. Continue carvedilol   Fuchs' corneal dystrophy of both eyes 05/21/2021   Gastroesophageal reflux disease 11/10/2010   Last Assessment & Plan:  Formatting of this note might be different from the original. Continue Dexilant.   GERD (gastroesophageal reflux disease)    Giant cell arteritis (HCC) 05/15/2022   05/15/2022: see eye doctor concerns     Glaucoma    Glaucoma 11/10/2010   IMO SNOMED Dx Update Oct 2024     Gout 12/02/2016   High cholesterol    History of ischemic stroke 06/30/2018   Hordeolum externum of right  upper eyelid 04/12/2022   04/12/2022     Hypertension    Hypertensive heart and kidney disease with heart failure and with chronic kidney disease stage III (HCC) 11/10/2010   Hypertensive heart disease with heart failure (HCC) 10/25/2014   Hypokalemia 06/04/2016   Last Assessment & Plan:  Replace with potassium chloride .   IBS (irritable bowel syndrome)    ICD (implantable cardioverter-defibrillator) in place 10/25/2014   Overview:  Overview:  dual chamber ICD,upgraded to CRT with LBBB and nonischemic cardiomyopathy  Formatting of this note might  be different from the original. Overview:  dual chamber ICD,upgraded to CRT with LBBB and nonischemic cardiomyopathy  Overview:  Overview:  BIV -ICD,upgraded to CRT with LBBB and nonischemic cardiomyopathy  Steven Elam, CMA - 09/07/2016 3:00 PM EDT REMOTE MONITORING ASSE   LBBB (left bundle branch block) 12/02/2016   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) 05/21/2015   Meibomian gland dysfunction (MGD) of both eyes 05/21/2021   Migraine 12/02/2016   Mild CAD 10/25/2014   Mixed hyperlipidemia 11/10/2010   Last Assessment & Plan:  Formatting of this note might be different from the original. Continue statin therapy.   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 Farrel Hones in August of 2016   Peripheral arterial occlusive disease (HCC) 11/03/2014   Precordial chest pain 06/04/2016   Last Assessment & Plan:  My suspicion is that this is likely related to gastroesophageal reflux disease with acute indigestion last evening.  The biomarker elevation is not completely unexpected in this elderly patient with reported cardiomyopathy and current demand ventricular pacing.  Nonetheless she does have a history of extensive vascular disease (though apparently no significant coronary atherosclerosis in 2013) so we will proceed with a noninvasive risk stratification with pharmacological nuclear stress testing.  Assuming this is a low risk or normal study then I would not advise any invasive cardiac evaluation at this time.   Preoperative cardiovascular examination 04/02/2015   Primary open angle  glaucoma (POAG) of both eyes, severe stage 09/24/2022   Pseudophakia of both eyes 05/21/2021   Rectal bleeding 11/19/2017   Formatting of this note might be different from the original. 2019   Renal artery stenosis (HCC) 12/02/2016   Rheumatoid arthritis involving multiple sites (HCC) 05/21/2015   Screening for breast cancer 04/02/2015   Stroke [facility])    Type 2 diabetes mellitus (HCC) 11/10/2010   Last Assessment & Plan:  Formatting of this note might be different from the original. Continue glipizide and add sliding scale insulin coverage Formatting of this note might be different from the original. Per 01/28/2019 Podiatry VN.   Type 2 diabetes mellitus with diabetic polyneuropathy, without long-term current use of insulin (HCC) 02/25/2019   Per 01/28/2019 Podiatry VN.     Unintended weight loss 07/26/2022   07/26/2022     Urge incontinence of urine 09/08/2020   Formatting of this note might be different from the original.  09/08/2020: chronic, UROL referral     Urinary tract infection without hematuria 08/18/2021   Formatting of this note  might be different from the original.  08/18/2021     Vitamin D deficiency 06/20/2016    Allergies  Allergen Reactions   Doxycycline Nausea Only    Other Reaction(s): GI Intolerance   Metformin Nausea Only and Other (See Comments)    Other Reaction(s): GI Intolerance   Codeine Diarrhea, Nausea And Vomiting, Other (See Comments) and Hives    All-over body aches, too Other reaction(s): GI intolerance   Dulaglutide Other (See Comments)    Chest pain, N/V  Other Reaction(s): GI Intolerance   Ivp Dye [Iodinated Contrast Media] Nausea And Vomiting and Other (See Comments)   Metrizamide Nausea And Vomiting and Other (See Comments)   Pioglitazone Other (See Comments)    Has CHF    Objective:  Pooja S Peavy is a pleasant 89 y.o. female in NAD. AAO x 3.  Vascular Examination: Patient has palpable DP pulse, absent PT pulse bilateral.  Delayed capillary  refill bilateral toes.  Sparse digital hair bilateral.  Proximal to distal cooling WNL bilateral.    Dermatological Examination: Interspaces are clear with no open lesions noted bilateral.  Skin is shiny and atrophic bilateral.  Nails are 3-63mm thick, with yellowish/brown discoloration, subungual debris and distal onycholysis x8.  There is pain with compression of nails x 8.    Assessment/Plan: 1. Pain due to onychomycosis of toenails of both feet     Mycotic nails x8 were sharply debrided with sterile nail nippers and power debriding burr to decrease bulk and length.  Return in about 3 months (around 12/22/2023) for [facility].   Joe Murders, DPM, FACFAS [facility]     [address] [place], [place] [address]                Office [phone]  Fax [phone]

## 2023-09-24 DIAGNOSIS — E781 Pure hyperglyceridemia: Secondary | ICD-10-CM | POA: Insufficient documentation

## 2023-11-29 NOTE — Progress Notes (Signed)
 Remote ICD transmission.

## 2023-12-19 ENCOUNTER — Ambulatory Visit (INDEPENDENT_AMBULATORY_CARE_PROVIDER_SITE_OTHER): Payer: Medicare PPO

## 2023-12-19 DIAGNOSIS — I428 Other cardiomyopathies: Secondary | ICD-10-CM

## 2023-12-20 ENCOUNTER — Ambulatory Visit: Admitting: Podiatry

## 2023-12-20 DIAGNOSIS — E1151 Type 2 diabetes mellitus with diabetic peripheral angiopathy without gangrene: Secondary | ICD-10-CM

## 2023-12-20 DIAGNOSIS — L84 Corns and callosities: Secondary | ICD-10-CM | POA: Diagnosis not present

## 2023-12-20 DIAGNOSIS — M79674 Pain in right toe(s): Secondary | ICD-10-CM

## 2023-12-20 DIAGNOSIS — M79675 Pain in left toe(s): Secondary | ICD-10-CM

## 2023-12-20 DIAGNOSIS — B351 Tinea unguium: Secondary | ICD-10-CM | POA: Diagnosis not present

## 2023-12-20 LAB — CUP PACEART REMOTE DEVICE CHECK
Battery Remaining Longevity: 18 mo
Battery Remaining Percentage: 31 %
Battery Voltage: 2.83 V
Brady Statistic AP VP Percent: 2.3 %
Brady Statistic AP VS Percent: 1 %
Brady Statistic AS VP Percent: 98 %
Brady Statistic AS VS Percent: 1 %
Brady Statistic RA Percent Paced: 2.3 %
Date Time Interrogation Session: 20250917020035
HighPow Impedance: 56 Ohm
Implantable Lead Connection Status: 753985
Implantable Lead Connection Status: 753985
Implantable Lead Connection Status: 753985
Implantable Lead Implant Date: 20140702
Implantable Lead Implant Date: 20140702
Implantable Lead Implant Date: 20150730
Implantable Lead Location: 753858
Implantable Lead Location: 753859
Implantable Lead Location: 753860
Implantable Lead Model: 7122
Implantable Pulse Generator Implant Date: 20210322
Lead Channel Impedance Value: 330 Ohm
Lead Channel Impedance Value: 410 Ohm
Lead Channel Impedance Value: 450 Ohm
Lead Channel Pacing Threshold Amplitude: 0.75 V
Lead Channel Pacing Threshold Amplitude: 1 V
Lead Channel Pacing Threshold Amplitude: 2.5 V
Lead Channel Pacing Threshold Pulse Width: 0.4 ms
Lead Channel Pacing Threshold Pulse Width: 0.5 ms
Lead Channel Pacing Threshold Pulse Width: 1.2 ms
Lead Channel Sensing Intrinsic Amplitude: 1.3 mV
Lead Channel Sensing Intrinsic Amplitude: 9.6 mV
Lead Channel Setting Pacing Amplitude: 2 V
Lead Channel Setting Pacing Amplitude: 2.5 V
Lead Channel Setting Pacing Amplitude: 3.25 V
Lead Channel Setting Pacing Pulse Width: 0.4 ms
Lead Channel Setting Pacing Pulse Width: 1.2 ms
Lead Channel Setting Sensing Sensitivity: 0.5 mV
Pulse Gen Serial Number: 810001491
Zone Setting Status: 755011

## 2023-12-20 NOTE — Progress Notes (Signed)
 Subjective:  Patient ID: Shari [name], female    DOB: Apr 25, 1934,  MRN: [id number]  Jeffrey Voth Kabler presents to clinic today for: Patient notes nails are thick and elongated, causing pain in shoe gear when ambulating.  She has painful corns/calluses on left 5th toe and on the right bunion.   PCP is Ofilia Lamar CROME, MD.  Last seen around 10/09/23.  Past Medical History:  Diagnosis Date   After cataract not obscuring vision, bilateral 05/21/2021   Anxiety    Atypical chest pain 08/24/2021   Automatic implantable cardioverter-defibrillator in situ 10/25/2014   Overview:  Overview:  dual chamber ICD,upgraded to CRT with LBBB and nonischemic cardiomyopathy   Biventricular ICD (implantable cardioverter-defibrillator) in place 10/25/2014   Overview:  BIV -ICD,upgraded to CRT with LBBB and nonischemic cardiomyopathy   Carotid artery stenosis    Chronic combined systolic and diastolic heart failure (HCC) 10/25/2014   Overview:  EF 77% by MUGA 06/15/15  Formatting of this note might be different from the original. Overview:  EF 66% by MUGA 03/12/14  Overview:  Overview:  EF 77% by MUGA 06/15/15  Overview:  EF 66% by MUGA 03/12/14  Overview:  Overview:  EF 77% by MUGA 06/15/15 Formatting of this note might be different from the original. EF 66% by MUGA 03/12/14   Chronic coronary artery disease 10/25/2014   Chronic kidney disease, stage 3 (HCC) 12/05/2016   Combined systolic and diastolic congestive heart failure (HCC) 10/25/2014   Last Assessment & Plan:  Formatting of this note might be different from the original. Type and degree unknown though patient currently appears to be euvolemic.   Community acquired pneumonia of right lung 07/17/2022   07/17/2022     Coronary artery disease 10/25/2014   Costochondritis 10/25/2014   COVID-19 12/20/2020   Diabetes mellitus 2 years   Diverticulitis    Dizziness 06/30/2018   Last Assessment & Plan:  Formatting of this note might be different from the  original. 89 yo F with history of CHF, DM, PVD, h/o TIA with double vision in 2013, HTN, HLD.   Yesterday morning she reports that when she woke up and looked over at her clock it looked blurry.  She denies double vision, facial droop or extremity weakness.  When she tried to get up to ambulate she reports that she felt d   Dry eye syndrome of both lacrimal glands 05/21/2021   Dysuria 06/15/2017   Elevated troponin 06/04/2016   Last Assessment & Plan:  Likely may be related to ventricular pacing and underlying cardiomyopathy.  The trajectory of the biomarker elevation and neither is her clinical history suggestive of acute coronary syndrome.  Further stratification with nuclear stress testing   Essential hypertension 06/04/2016   Last Assessment & Plan:  Formatting of this note might be different from the original. Continue carvedilol   Fuchs' corneal dystrophy of both eyes 05/21/2021   Gastroesophageal reflux disease 11/10/2010   Last Assessment & Plan:  Formatting of this note might be different from the original. Continue Dexilant.   GERD (gastroesophageal reflux disease)    Giant cell arteritis (HCC) 05/15/2022   05/15/2022: see eye doctor concerns     Glaucoma    Glaucoma 11/10/2010   IMO SNOMED Dx Update Oct 2024     Gout 12/02/2016   High cholesterol    History of ischemic stroke 06/30/2018   Hordeolum externum of right upper eyelid 04/12/2022   04/12/2022  Hypertension    Hypertensive heart and kidney disease with heart failure and with chronic kidney disease stage III (HCC) 11/10/2010   Hypertensive heart disease with heart failure (HCC) 10/25/2014   Hypokalemia 06/04/2016   Last Assessment & Plan:  Replace with potassium chloride .   IBS (irritable bowel syndrome)    ICD (implantable cardioverter-defibrillator) in place 10/25/2014   Overview:  Overview:  dual chamber ICD,upgraded to CRT with LBBB and nonischemic cardiomyopathy  Formatting of this note might be different from the  original. Overview:  dual chamber ICD,upgraded to CRT with LBBB and nonischemic cardiomyopathy  Overview:  Overview:  BIV -ICD,upgraded to CRT with LBBB and nonischemic cardiomyopathy  Koleen Ny, CMA - 09/07/2016 3:00 PM EDT REMOTE MONITORING ASSE   LBBB (left bundle branch block) 12/02/2016   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) 05/21/2015   Meibomian gland dysfunction (MGD) of both eyes 05/21/2021   Migraine 12/02/2016   Mild CAD 10/25/2014   Mixed hyperlipidemia 11/10/2010   Last Assessment & Plan:  Formatting of this note might be different from the original. Continue statin therapy.   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 Laurence in August of 2016   Peripheral arterial occlusive disease 11/03/2014   Precordial chest pain 06/04/2016   Last Assessment & Plan:  My suspicion is that this is likely related to gastroesophageal reflux disease with acute indigestion last evening.  The biomarker elevation is not completely unexpected in this elderly patient with reported cardiomyopathy and current demand ventricular pacing.  Nonetheless she does have a history of extensive vascular disease (though apparently no significant coronary atherosclerosis in 2013) so we will proceed with a noninvasive risk stratification with pharmacological nuclear stress testing.  Assuming this is a low risk or normal study then I would not advise any invasive cardiac evaluation at this time.   Preoperative cardiovascular examination 04/02/2015   Primary open angle glaucoma (POAG) of both eyes, severe  stage 09/24/2022   Pseudophakia of both eyes 05/21/2021   Rectal bleeding 11/19/2017   Formatting of this note might be different from the original. 2019   Renal artery stenosis 12/02/2016   Rheumatoid arthritis involving multiple sites (HCC) 05/21/2015   Screening for breast cancer 04/02/2015   Stroke [facility])    Type 2 diabetes mellitus (HCC) 11/10/2010   Last Assessment & Plan:  Formatting of this note might be different from the original. Continue glipizide and add sliding scale insulin coverage Formatting of this note might be different from the original. Per 01/28/2019 Podiatry VN.   Type 2 diabetes mellitus with diabetic polyneuropathy, without long-term current use of insulin (HCC) 02/25/2019   Per 01/28/2019 Podiatry VN.     Unintended weight loss 07/26/2022   07/26/2022     Urge incontinence of urine 09/08/2020   Formatting of this note might be different from the original.  09/08/2020: chronic, UROL referral     Urinary tract infection without hematuria 08/18/2021   Formatting of this note might be different from the original.  08/18/2021     Vitamin  D deficiency 06/20/2016   Allergies  Allergen Reactions   Doxycycline Nausea Only    Other Reaction(s): GI Intolerance   Metformin Nausea Only and Other (See Comments)    Other Reaction(s): GI Intolerance   Codeine Diarrhea, Nausea And Vomiting, Other (See Comments) and Hives    All-over body aches, too Other reaction(s): GI intolerance   Dulaglutide Other (See Comments)    Chest pain, N/V  Other Reaction(s): GI Intolerance   Ivp Dye [Iodinated Contrast Media] Nausea And Vomiting and Other (See Comments)   Metrizamide Nausea And Vomiting and Other (See Comments)   Pioglitazone Other (See Comments)    Has CHF    Objective:  Ashiyah S Longton is a pleasant 89 y.o. female in NAD. AAO x 3.  Vascular Examination: Patient has palpable DP pulse, absent PT pulse bilateral.  Delayed capillary refill bilateral toes.  Sparse digital hair  bilateral.  Proximal to distal cooling WNL bilateral.    Dermatological Examination: Interspaces are clear with no open lesions noted bilateral.  Skin is shiny and atrophic bilateral.  Nails are 3-36mm thick, with yellowish/brown discoloration, subungual debris and distal onycholysis x10.  There is pain with compression of nails x10.  There are hyperkeratotic lesions noted left 5th toe dorsolateral PIPJ and right plantarmedial bunion .  Patient qualifies for at-risk foot care because of diabetes with PVD .  Assessment/Plan: 1. Pain due to onychomycosis of toenails of both feet   2. Corns and callosities   3. Type II diabetes mellitus with peripheral circulatory disorder (HCC)    Mycotic nails x10 were sharply debrided with sterile nail nippers and power debriding burr to decrease bulk and length.  Hyperkeratotic lesions x2 were shaved with #312 blade.  Return in about 3 months (around 03/20/2024) for [facility].   Awanda CHARM Imperial, DPM, FACFAS [facility]     [address] [place], [place] [address]                Office [phone]  Fax [phone]

## 2023-12-24 ENCOUNTER — Ambulatory Visit: Payer: Self-pay | Admitting: Cardiology

## 2023-12-25 NOTE — Progress Notes (Signed)
Remote ICD Transmission.

## 2024-01-30 ENCOUNTER — Ambulatory Visit: Admitting: Cardiology

## 2024-02-04 ENCOUNTER — Other Ambulatory Visit: Payer: Self-pay

## 2024-02-04 DIAGNOSIS — I6529 Occlusion and stenosis of unspecified carotid artery: Secondary | ICD-10-CM | POA: Insufficient documentation

## 2024-02-04 NOTE — Progress Notes (Unsigned)
 Cardiology Office Note:    Date:  02/05/2024   ID:  Shari [name], DOB 06-29-1934, MRN [id number]  PCP:  Ofilia Lamar CROME, MD  Cardiologist:  Redell Leiter, MD    Referring MD: Ofilia Lamar CROME, MD    ASSESSMENT:    1. Nonischemic cardiomyopathy (HCC)   2. Biventricular ICD (implantable cardioverter-defibrillator) in place   3. Coronary artery disease involving native coronary artery of native heart without angina pectoris   4. Benign hypertensive heart and CKD, stage 3 (GFR 30-59), w CHF (HCC)    PLAN:    In order of problems listed above:  Overall doing well cardiomyopathy stable EF normalized with CRT and heart failure compensated without any fluid overload and BP at target Continue her current loop diuretic furosemide  along with SGLT2 inhibitor beta-blocker and ACE inhibitor. Stable device follow-up in our device clinic Stable CAD despite a small area of ischemia perfusion study having no angina and she will continue her medical therapy including long-term clopidogrel  with her PAD nitrate beta-blocker lipid-lowering with atorvastatin  and icosapent ethyl Stable CKD   Next appointment: 33-month follow-up   Medication Adjustments/Labs and Tests Ordered: Current medicines are reviewed at length with the patient today.  Concerns regarding medicines are outlined above.  No orders of the defined types were placed in this encounter.  No orders of the defined types were placed in this encounter.    History of Present Illness:    Shari [name] is a 89 y.o. female with a hx of nonischemic cardiomyopathy due to left bundle branch block treated with CRT/D and normalization of ejection fraction mild nonobstructive CAD hypertensive heart disease with stage III CKD dyslipidemia PAD with previous PCI and stent left common carotid artery last seen 04/06/2023.  She had a myocardial perfusion study 04/26/2023 with very mild localized anterior ischemia normal ejection fraction treated medically.   Echocardiogram at that time showed normal ejection fraction and global and segmental left ventricular systolic function she had mild mitral regurgitation mild aortic regurgitation also.  Carotid duplex March 2023 showed no stenosis in the carotid artery.  Compliance with diet, lifestyle and medications: Yes  Overall I think she is doing well she is really way down by the responsibility of caring for her husband She thinks she is overdue for device check we have checked her in September her husband is going to clinic tomorrow and asked her to check to see when her next download is due She follows her home blood pressure and consistently 130 and 140/70-80 Not having edema shortness of breath chest pain palpitation or syncope Particular having no anginal discomfort  Recent labs 01/23/2024 cholesterol 133 LDL 65 A1c 6.8 in May CBC showed a hemoglobin of 13 2 platelets 133,000 mildly diminished creatinine 132 GFR 39 cc potassium 3.6 Past Medical History:  Diagnosis Date   After cataract not obscuring vision, bilateral 05/21/2021   Anxiety    Atypical chest pain 08/24/2021   Automatic implantable cardioverter-defibrillator in situ 10/25/2014   Overview:  Overview:  dual chamber ICD,upgraded to CRT with LBBB and nonischemic cardiomyopathy   Biventricular ICD (implantable cardioverter-defibrillator) in place 10/25/2014   Overview:  BIV -ICD,upgraded to CRT with LBBB and nonischemic cardiomyopathy   Carotid artery stenosis    Chronic combined systolic and diastolic heart failure (HCC) 10/25/2014   Overview:  EF 77% by MUGA 06/15/15  Formatting of this note might be different from the original. Overview:  EF 66% by MUGA 03/12/14  Overview:  Overview:  EF  77% by MUGA 06/15/15  Overview:  EF 66% by MUGA 03/12/14  Overview:  Overview:  EF 77% by MUGA 06/15/15 Formatting of this note might be different from the original. EF 66% by MUGA 03/12/14   Chronic coronary artery disease 10/25/2014   Chronic kidney  disease, stage 3 (HCC) 12/05/2016   Combined systolic and diastolic congestive heart failure (HCC) 10/25/2014   Last Assessment & Plan:  Formatting of this note might be different from the original. Type and degree unknown though patient currently appears to be euvolemic.   Community acquired pneumonia of right lung 07/17/2022   07/17/2022     Coronary artery disease 10/25/2014   Costochondritis 10/25/2014   COVID-19 12/20/2020   Diverticulitis    Dizziness 06/30/2018   Last Assessment & Plan:  Formatting of this note might be different from the original. 89 yo F with history of CHF, DM, PVD, h/o TIA with double vision in 2013, HTN, HLD.   Yesterday morning she reports that when she woke up and looked over at her clock it looked blurry.  She denies double vision, facial droop or extremity weakness.  When she tried to get up to ambulate she reports that she felt d   Dry eye syndrome of both lacrimal glands 05/21/2021   Dysuria 06/15/2017   Elevated troponin 06/04/2016   Last Assessment & Plan:  Likely may be related to ventricular pacing and underlying cardiomyopathy.  The trajectory of the biomarker elevation and neither is her clinical history suggestive of acute coronary syndrome.  Further stratification with nuclear stress testing   Essential hypertension 06/04/2016   Last Assessment & Plan:  Formatting of this note might be different from the original. Continue carvedilol   Fuchs' corneal dystrophy of both eyes 05/21/2021   Gastroesophageal reflux disease 11/10/2010   Last Assessment & Plan:  Formatting of this note might be different from the original. Continue Dexilant.   GERD (gastroesophageal reflux disease)    Giant cell arteritis (HCC) 05/15/2022   05/15/2022: see eye doctor concerns     Glaucoma 11/10/2010   IMO SNOMED Dx Update Oct 2024     Gout 12/02/2016   High cholesterol    History of ischemic stroke 06/30/2018   Hordeolum externum of right upper eyelid 04/12/2022    04/12/2022     Hypertension    Hypertensive heart and kidney disease with heart failure and with chronic kidney disease stage III (HCC) 11/10/2010   Hypertensive heart disease with heart failure (HCC) 10/25/2014   Hypokalemia 06/04/2016   Last Assessment & Plan:  Replace with potassium chloride .   IBS (irritable bowel syndrome)    ICD (implantable cardioverter-defibrillator) in place 10/25/2014   Overview:  Overview:  dual chamber ICD,upgraded to CRT with LBBB and nonischemic cardiomyopathy  Formatting of this note might be different from the original. Overview:  dual chamber ICD,upgraded to CRT with LBBB and nonischemic cardiomyopathy  Overview:  Overview:  BIV -ICD,upgraded to CRT with LBBB and nonischemic cardiomyopathy  Koleen Ny, CMA - 09/07/2016 3:00 PM EDT REMOTE MONITORING ASSE   LBBB (left bundle branch block) 12/02/2016   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) 05/21/2015   Meibomian gland dysfunction (MGD) of both eyes 05/21/2021   Migraine 12/02/2016   Mild CAD 10/25/2014   Mixed hyperlipidemia 11/10/2010   Last Assessment & Plan:  Formatting of this note might be different from the original. Continue statin therapy.   Neck mass 07/26/2022   07/26/2022: wt loss, neck mass (B)  Nephrolithiasis 12/02/2016   Neuralgic pain 04/12/2022   04/12/2022: onset 12/2021, right T4 anterior     Onychomycosis 11/10/2010   Optic nerve disorder, bilateral 05/11/2022   Optic neuropathy 08/01/2022   08/01/2022 : [facility]     Osteoarthritis    PAD (peripheral artery disease) 11/03/2014   Overview:  status post insertion of aortic stent and left common iliac stent by Dr. Laurence in August of 2016   Peripheral arterial occlusive disease 11/03/2014   Precordial chest pain 06/04/2016   Last Assessment & Plan:  My suspicion is that this is likely related to gastroesophageal reflux disease with acute indigestion last evening.  The biomarker elevation is not completely unexpected in  this elderly patient with reported cardiomyopathy and current demand ventricular pacing.  Nonetheless she does have a history of extensive vascular disease (though apparently no significant coronary atherosclerosis in 2013) so we will proceed with a noninvasive risk stratification with pharmacological nuclear stress testing.  Assuming this is a low risk or normal study then I would not advise any invasive cardiac evaluation at this time.   Preoperative cardiovascular examination 04/02/2015   Primary open angle glaucoma (POAG) of both eyes, severe stage 09/24/2022   Pseudophakia of both eyes 05/21/2021   Rectal bleeding 11/19/2017   Formatting of this note might be different from the original. 2019   Renal artery stenosis 12/02/2016   Rheumatoid arthritis involving multiple sites (HCC) 05/21/2015   Screening for breast cancer 04/02/2015   Stroke [facility])    Type 2 diabetes mellitus (HCC) 11/10/2010   Last Assessment & Plan:  Formatting of this note might be different from the original. Continue glipizide and add sliding scale insulin coverage Formatting of this note might be different from the original. Per 01/28/2019 Podiatry VN.   Type 2 diabetes mellitus with diabetic polyneuropathy, without long-term current use of insulin (HCC) 02/25/2019   Per 01/28/2019 Podiatry VN.     Unintended weight loss 07/26/2022   07/26/2022     Urge incontinence of urine 09/08/2020   Formatting of this note might be different from the original.  09/08/2020: chronic, UROL referral     Urinary tract infection without hematuria 08/18/2021   Formatting of this note might be different from the original.  08/18/2021     Vitamin D deficiency 06/20/2016    Current Medications: Current Meds  Medication Sig   acetaminophen  (TYLENOL ) 500 MG tablet Take 1,000 mg by mouth 2 (two) times daily.   albuterol (VENTOLIN HFA) 108 (90 Base) MCG/ACT inhaler 1 puff every 4 (four) hours as needed for shortness of breath.   atorvastatin   (LIPITOR) 40 MG tablet Take 1 tablet (40 mg total) by mouth daily.   brimonidine (ALPHAGAN) 0.2 % ophthalmic solution Place 1 drop into both eyes 3 (three) times daily.   Calcium  Carb-Cholecalciferol (CALCIUM  HIGH POTENCY/VITAMIN D) 600-5 MG-MCG TABS Take 1 tablet by mouth daily.   clopidogrel  (PLAVIX ) 75 MG tablet Take 1 tablet (75 mg total) by mouth daily.   FARXIGA 5 MG TABS tablet Take 5 mg by mouth daily.   fesoterodine (TOVIAZ) 4 MG TB24 tablet Take 4 mg by mouth daily.   furosemide  (LASIX ) 80 MG tablet TAKE ONE TABLET BY MOUTH DAILY   glipiZIDE (GLUCOTROL) 10 MG tablet Take 10 mg by mouth 2 (two) times daily before a meal.    isosorbide  mononitrate (IMDUR ) 60 MG 24 hr tablet Take 1 tablet (60 mg total) by mouth daily.   JANUVIA 25 MG tablet Take 25  mg by mouth daily.   ketoconazole (NIZORAL) 2 % shampoo Apply 1 application topically 2 (two) times a week.   latanoprost (XALATAN) 0.005 % ophthalmic solution Place 1 drop into both eyes at bedtime.   metoprolol  succinate (TOPROL -XL) 25 MG 24 hr tablet Take 1 tablet (25 mg total) by mouth daily.   MIEBO 1.338 GM/ML SOLN Place 1 drop into both eyes 3 (three) times daily.   montelukast (SINGULAIR) 10 MG tablet Take by mouth.   Multiple Vitamin (MULTI-VITAMIN) tablet Take 1 tablet by mouth daily.   Multiple Vitamins-Minerals (THERA-M) TABS Take 1 tablet by mouth daily.   Multiple Vitamins-Minerals (ZINC PO) Take 1 tablet by mouth daily at 12 noon.   nitroGLYCERIN  (NITROSTAT ) 0.4 MG SL tablet Place 1 tablet (0.4 mg total) under the tongue every 5 (five) minutes as needed for chest pain.   pantoprazole (PROTONIX) 40 MG tablet Take 40 mg by mouth daily.   potassium chloride  (KLOR-CON ) 10 MEQ tablet TAKE ONE TABLET BY MOUTH DAILY   Prodigy Twist Top Lancets 28G MISC by Does not apply route.   ramipril (ALTACE) 10 MG capsule Take 10 mg by mouth daily.   ranolazine  (RANEXA ) 500 MG 12 hr tablet Take by mouth.   RESTASIS 0.05 % ophthalmic emulsion  Place 1 drop into both eyes 2 (two) times daily.   solifenacin (VESICARE) 10 MG tablet Take 10 mg by mouth daily.   terbinafine (LAMISIL) 250 MG tablet Take 250 mg by mouth daily as needed (Dermatitis on scalp).    Timolol Maleate PF 0.5 % SOLN Apply 1 drop to eye.   tolterodine (DETROL LA) 4 MG 24 hr capsule Take 4 mg by mouth daily.   VASCEPA 1 g CAPS Take 2 g by mouth 2 (two) times daily.    Vitamin D, Ergocalciferol, (DRISDOL) 50000 units CAPS capsule Take 50,000 Units by mouth every 14 (fourteen) days.    [DISCONTINUED] fluconazole (DIFLUCAN) 100 MG tablet Take 100 mg by mouth.      EKGs/Labs/Other Studies Reviewed:    The following studies were reviewed today:  Cardiac Studies & Procedures   ______________________________________________________________________________________________   STRESS TESTS  MYOCARDIAL PERFUSION IMAGING 04/26/2023  Interpretation Summary   Findings are consistent with small area of  ischemia involving basal portion of the anterior wall. The study is low risk.   No ST deviation was noted.   Left ventricular function is normal. Nuclear stress EF: 73%. The left ventricular ejection fraction is hyperdynamic (>65%). End diastolic cavity size is normal.   Prior study not available for comparison.   ECHOCARDIOGRAM  ECHOCARDIOGRAM COMPLETE 04/26/2023  Narrative ECHOCARDIOGRAM REPORT    Patient Name:   Shari [name] Date of Exam: 04/26/2023 Medical Rec #:  [id number]     Height:       62.0 in Accession #:    [id number]    Weight:       110.6 lb Date of Birth:  19-Oct-1934     BSA:          1.486 m Patient Age:    88 years      BP:           118/62 mmHg Patient Gender: F             HR:           68 bpm. Exam Location:  [place]  Procedure: 2D Echo, Cardiac Doppler, Color Doppler and Strain Analysis  Indications:    Coronary artery disease involving native coronary  artery of native heart without angina pectoris [I25.10 (ICD-10-CM)]; Chest pain of  uncertain etiology [R07.9 (ICD-10-CM)]  History:        Patient has prior history of Echocardiogram examinations, most recent 06/20/2021. CAD, ICD; Risk Factors:Hypertension and Dyslipidemia.  Sonographer:    Charlie Jointer RDCS Referring Phys: [id number] Rae Plotner J Theodoros Stjames  IMPRESSIONS   1. Left ventricular ejection fraction, by estimation, is 60 to 65%. The left ventricle has normal function. The left ventricle has no regional wall motion abnormalities. There is mild left ventricular hypertrophy. Left ventricular diastolic parameters are consistent with Grade I diastolic dysfunction (impaired relaxation). Elevated left ventricular end-diastolic pressure. The average left ventricular global longitudinal strain is 11.4 %. The global longitudinal strain is abnormal. 2. Right ventricular systolic function is normal. The right ventricular size is normal. There is normal pulmonary artery systolic pressure. 3. The mitral valve is degenerative. Mild mitral valve regurgitation. No evidence of mitral stenosis. 4. The aortic valve was not well visualized. Aortic valve regurgitation is mild. No aortic stenosis is present. 5. Aortic DTA is NWV. 6. The inferior vena cava is normal in size with greater than 50% respiratory variability, suggesting right atrial pressure of 3 mmHg.  FINDINGS Left Ventricle: Left ventricular ejection fraction, by estimation, is 60 to 65%. The left ventricle has normal function. The left ventricle has no regional wall motion abnormalities. The average left ventricular global longitudinal strain is 11.4 %. The global longitudinal strain is abnormal. The left ventricular internal cavity size was normal in size. There is mild left ventricular hypertrophy. Left ventricular diastolic parameters are consistent with Grade I diastolic dysfunction (impaired relaxation). Elevated left ventricular end-diastolic pressure.  Right Ventricle: The right ventricular size is normal. No increase in  right ventricular wall thickness. Right ventricular systolic function is normal. There is normal pulmonary artery systolic pressure. The tricuspid regurgitant velocity is 2.37 m/s, and with an assumed right atrial pressure of 3 mmHg, the estimated right ventricular systolic pressure is 25.5 mmHg.  Left Atrium: Left atrial size was normal in size.  Right Atrium: Right atrial size was normal in size.  Pericardium: There is no evidence of pericardial effusion. Presence of epicardial fat layer.  Mitral Valve: The mitral valve is degenerative in appearance. There is mild thickening of the anterior and posterior mitral valve leaflet(s). Mild mitral annular calcification. Mild mitral valve regurgitation. No evidence of mitral valve stenosis.  Tricuspid Valve: The tricuspid valve is normal in structure. Tricuspid valve regurgitation is mild . No evidence of tricuspid stenosis.  Aortic Valve: The aortic valve was not well visualized. Aortic valve regurgitation is mild. Aortic regurgitation PHT measures 642 msec. No aortic stenosis is present.  Pulmonic Valve: The pulmonic valve was normal in structure. Pulmonic valve regurgitation is not visualized. No evidence of pulmonic stenosis.  Aorta: The aortic root is normal in size and structure, the aortic arch was not well visualized and DTA is NWV.  Venous: The pulmonary veins were not well visualized. The inferior vena cava is normal in size with greater than 50% respiratory variability, suggesting right atrial pressure of 3 mmHg.  IAS/Shunts: No atrial level shunt detected by color flow Doppler.   LEFT VENTRICLE PLAX 2D LVIDd:         3.20 cm   Diastology LVIDs:         2.20 cm   LV e' medial:    5.01 cm/s LV PW:         1.20 cm   LV E/e' medial:  17.0 LV IVS:        1.20 cm   LV e' lateral:   4.79 cm/s LVOT diam:     1.80 cm   LV E/e' lateral: 17.7 LV SV:         49 LV SV Index:   33        2D Longitudinal Strain LVOT Area:     2.54 cm  2D  Strain GLS Avg:     11.4 %   RIGHT VENTRICLE             IVC RV Basal diam:  3.40 cm     IVC diam: 1.10 cm RV Mid diam:    2.60 cm RV S prime:     11.90 cm/s TAPSE (M-mode): 2.2 cm  LEFT ATRIUM             Index        RIGHT ATRIUM           Index LA diam:        2.80 cm 1.88 cm/m   RA Area:     14.20 cm LA Vol (A2C):   32.1 ml 21.60 ml/m  RA Volume:   32.30 ml  21.73 ml/m LA Vol (A4C):   39.0 ml 26.24 ml/m LA Biplane Vol: 39.0 ml 26.24 ml/m AORTIC VALVE LVOT Vmax:   87.50 cm/s LVOT Vmean:  59.267 cm/s LVOT VTI:    0.193 m AI PHT:      642 msec  AORTA Ao Root diam: 3.10 cm Ao Asc diam:  3.00 cm Ao Desc diam: 1.30 cm  MITRAL VALVE                TRICUSPID VALVE MV Area (PHT): 2.89 cm     TR Peak grad:   22.5 mmHg MV Decel Time: 263 msec     TR Vmax:        237.00 cm/s MV E velocity: 84.95 cm/s MV A velocity: 126.50 cm/s  SHUNTS MV E/A ratio:  0.67         Systemic VTI:  0.19 m Systemic Diam: 1.80 cm  Redell Leiter MD Electronically signed by Redell Leiter MD Signature Date/Time: 04/26/2023/12:39:09 PM    Final    MONITORS  CARDIAC EVENT MONITOR 10/29/2017       ______________________________________________________________________________________________         Physical Exam:    VS:  BP (!) 98/40   Pulse 70   Ht 5' 2 (1.575 m)   Wt 108 lb 3.2 oz (49.1 kg)   SpO2 97%   BMI 19.79 kg/m     Wt Readings from Last 3 Encounters:  02/05/24 108 lb 3.2 oz (49.1 kg)  07/24/23 110 lb (49.9 kg)  04/26/23 110 lb (49.9 kg)     GEN: She is starting to look frail well nourished, well developed in no acute distress HEENT: Normal NECK: No JVD; No carotid bruits LYMPHATICS: No lymphadenopathy CARDIAC: RRR, no murmurs, rubs, gallops RESPIRATORY:  Clear to auscultation without rales, wheezing or rhonchi  ABDOMEN: Soft, non-tender, non-distended MUSCULOSKELETAL:  No edema; No deformity  SKIN: Warm and dry NEUROLOGIC:  Alert and oriented x 3 PSYCHIATRIC:   Normal affect    Signed, Redell Leiter, MD  02/05/2024 2:47 PM    [facility]

## 2024-02-05 ENCOUNTER — Encounter: Payer: Self-pay | Admitting: Cardiology

## 2024-02-05 ENCOUNTER — Ambulatory Visit: Attending: Cardiology | Admitting: Cardiology

## 2024-02-05 VITALS — BP 98/40 | HR 70 | Ht 62.0 in | Wt 108.2 lb

## 2024-02-05 DIAGNOSIS — Z95810 Presence of automatic (implantable) cardiac defibrillator: Secondary | ICD-10-CM | POA: Diagnosis not present

## 2024-02-05 DIAGNOSIS — I130 Hypertensive heart and chronic kidney disease with heart failure and stage 1 through stage 4 chronic kidney disease, or unspecified chronic kidney disease: Secondary | ICD-10-CM | POA: Diagnosis not present

## 2024-02-05 DIAGNOSIS — I2510 Atherosclerotic heart disease of native coronary artery without angina pectoris: Secondary | ICD-10-CM

## 2024-02-05 DIAGNOSIS — N183 Chronic kidney disease, stage 3 unspecified: Secondary | ICD-10-CM

## 2024-02-05 DIAGNOSIS — I428 Other cardiomyopathies: Secondary | ICD-10-CM | POA: Diagnosis not present

## 2024-02-05 NOTE — Patient Instructions (Signed)

## 2024-02-22 ENCOUNTER — Other Ambulatory Visit: Payer: Self-pay | Admitting: Cardiology

## 2024-02-22 DIAGNOSIS — I739 Peripheral vascular disease, unspecified: Secondary | ICD-10-CM

## 2024-03-19 ENCOUNTER — Ambulatory Visit: Payer: Medicare PPO

## 2024-03-19 DIAGNOSIS — I428 Other cardiomyopathies: Secondary | ICD-10-CM

## 2024-03-20 ENCOUNTER — Ambulatory Visit: Admitting: Podiatry

## 2024-03-20 LAB — CUP PACEART REMOTE DEVICE CHECK
Battery Remaining Longevity: 11 mo
Battery Remaining Percentage: 19 %
Battery Voltage: 2.8 V
Brady Statistic AP VP Percent: 4.4 %
Brady Statistic AP VS Percent: 1 %
Brady Statistic AS VP Percent: 96 %
Brady Statistic AS VS Percent: 1 %
Brady Statistic RA Percent Paced: 4.4 %
Date Time Interrogation Session: 20251217020041
HighPow Impedance: 54 Ohm
Implantable Lead Connection Status: 753985
Implantable Lead Connection Status: 753985
Implantable Lead Connection Status: 753985
Implantable Lead Implant Date: 20140702
Implantable Lead Implant Date: 20140702
Implantable Lead Implant Date: 20150730
Implantable Lead Location: 753858
Implantable Lead Location: 753859
Implantable Lead Location: 753860
Implantable Lead Model: 7122
Implantable Pulse Generator Implant Date: 20210322
Lead Channel Impedance Value: 310 Ohm
Lead Channel Impedance Value: 400 Ohm
Lead Channel Impedance Value: 440 Ohm
Lead Channel Pacing Threshold Amplitude: 0.75 V
Lead Channel Pacing Threshold Amplitude: 1 V
Lead Channel Pacing Threshold Amplitude: 2.5 V
Lead Channel Pacing Threshold Pulse Width: 0.4 ms
Lead Channel Pacing Threshold Pulse Width: 0.5 ms
Lead Channel Pacing Threshold Pulse Width: 1.2 ms
Lead Channel Sensing Intrinsic Amplitude: 1.5 mV
Lead Channel Sensing Intrinsic Amplitude: 9.8 mV
Lead Channel Setting Pacing Amplitude: 2 V
Lead Channel Setting Pacing Amplitude: 2.5 V
Lead Channel Setting Pacing Amplitude: 3.25 V
Lead Channel Setting Pacing Pulse Width: 0.4 ms
Lead Channel Setting Pacing Pulse Width: 1.2 ms
Lead Channel Setting Sensing Sensitivity: 0.5 mV
Pulse Gen Serial Number: 810001491
Zone Setting Status: 755011

## 2024-03-21 NOTE — Progress Notes (Signed)
 Remote ICD Transmission

## 2024-04-01 ENCOUNTER — Ambulatory Visit: Payer: Self-pay | Admitting: Cardiology

## 2024-04-08 ENCOUNTER — Other Ambulatory Visit: Payer: Self-pay | Admitting: Cardiology

## 2024-04-21 ENCOUNTER — Ambulatory Visit: Admitting: Podiatry

## 2024-04-21 DIAGNOSIS — L84 Corns and callosities: Secondary | ICD-10-CM

## 2024-04-21 DIAGNOSIS — B351 Tinea unguium: Secondary | ICD-10-CM

## 2024-04-21 DIAGNOSIS — M79674 Pain in right toe(s): Secondary | ICD-10-CM | POA: Diagnosis not present

## 2024-04-21 DIAGNOSIS — E1151 Type 2 diabetes mellitus with diabetic peripheral angiopathy without gangrene: Secondary | ICD-10-CM | POA: Diagnosis not present

## 2024-04-21 DIAGNOSIS — M79675 Pain in left toe(s): Secondary | ICD-10-CM | POA: Diagnosis not present

## 2024-04-21 NOTE — Progress Notes (Unsigned)
 And nails x 10.  Dorsal left fifth toe corn, right bunion corn.  These were sanded

## 2024-07-21 ENCOUNTER — Ambulatory Visit: Admitting: Podiatry
